# Patient Record
Sex: Female | Born: 1970
Health system: Southern US, Community
[De-identification: ages and names within clinical notes are randomized; demographics above are authoritative.]

## PROBLEM LIST (undated history)

## (undated) DIAGNOSIS — E119 Type 2 diabetes mellitus without complications: Secondary | ICD-10-CM

## (undated) DIAGNOSIS — I509 Heart failure, unspecified: Secondary | ICD-10-CM

## (undated) DIAGNOSIS — C3410 Malignant neoplasm of upper lobe, unspecified bronchus or lung: Secondary | ICD-10-CM

## (undated) DIAGNOSIS — I10 Essential (primary) hypertension: Secondary | ICD-10-CM

## (undated) DIAGNOSIS — F419 Anxiety disorder, unspecified: Secondary | ICD-10-CM

## (undated) HISTORY — PX: BREAST SURGERY: SHX581

## (undated) HISTORY — DX: Anxiety disorder, unspecified: F41.9

## (undated) HISTORY — DX: Essential (primary) hypertension: I10

## (undated) HISTORY — PX: ABDOMINAL HYSTERECTOMY: SHX81

## (undated) HISTORY — DX: Heart failure, unspecified: I50.9

## (undated) HISTORY — DX: Malignant neoplasm of upper lobe, unspecified bronchus or lung: C34.10

## (undated) HISTORY — DX: Type 2 diabetes mellitus without complications: E11.9

## (undated) HISTORY — PX: HYSTERECTOMY: SHX81

---

## 1999-01-03 ENCOUNTER — Emergency Department (HOSPITAL_COMMUNITY): Admission: EM | Admit: 1999-01-03 | Discharge: 1999-01-03 | Payer: Self-pay

## 2009-06-03 ENCOUNTER — Ambulatory Visit: Payer: Self-pay

## 2009-06-03 ENCOUNTER — Ambulatory Visit: Admission: RE | Admit: 2009-06-03 | Payer: Self-pay | Source: Ambulatory Visit | Admitting: Plastic Surgery

## 2009-06-03 LAB — HEMOGLOBIN AND HEMATOCRIT, BLOOD
Hematocrit: 43.6 % (ref 37.0–47.0)
Hgb: 14.4 g/dL (ref 12.0–16.0)

## 2009-06-06 LAB — LAB USE ONLY - HISTORICAL SURGICAL PATHOLOGY

## 2010-11-17 NOTE — H&P (Unsigned)
Account Number: [id number]    *** PRELIMINARY DRAFT ***      Document ID: [id number]      Admit Date: 06/03/2009            Patient Location: [facility]OO      Patient Type: P            ATTENDING PHYSICIAN: Estanislado Emms, MD                  CLINICAL NOTE:      This 40 year old woman presents with large-volume, full pendulous breasts.      This gives her pain, not only in the breasts themselves, but also shoulder      strap furrows, backaches, stooped posture, and inability to participate in      normal activity.  She comes to the operating room at this time for      bilateral reduction mammoplasty to try to relieve these organic symptoms.            The patient has had a full history and physical clearance by her medical      doctor, Dr. _____.  This full report as well as laboratory studies are      included with this chart and are merged with this H and P by this      reference.            The patient has had a full discussion as to the nature of the proposed      procedure.  She understands that because of her diabetic history, she is at      increased risk of wound healing problems.  She understands about the      potential for bleeding, infection, scarring, necessity for secondary      surgery, distortion, problems with wound healing including fat necrosis and      chronic drainage, loss of flaps including skin flaps, and necessity for      secondary reconstructive procedures.  She understands about decreased      nipple sensation potential and other risks.  Having thus been duly      informed, patient comes in for indicated procedure.                              _______________________________     Date/Time Signed: _____________      Babs Bertin MD [phone])            D:  06/02/2009 11:58 AM by Dr. Babs Bertin, MD [phone])      T:  06/02/2009 12:38 PM by [id number]          Everlean Cherry: [id number]) (Doc ID: [id number])                  NG:[email or web address] Oveda Dadamo MD

## 2010-11-17 NOTE — Op Note (Signed)
Grace Solomon, Grace Solomon      MRN:          [id number]      Account:      [id number]      Document ID:  [id number]      Procedure Date: 06/03/2009            Admit Date: 06/03/2009            Patient Location: DISCHARGED 06/03/2009      Patient Type: A            SURGEON: Babs Bertin MD      ASSISTANT:                  CLINICAL NOTE:      This pleasant 40 year old woman has large low hanging pendulous breasts.      This gives her pain not only in the breasts themselves, but also shoulder      strap furrows, backaches, stooped posture, and inability to participate in      normal activity.  She comes to the operating room at this time for      bilateral reduction mammoplasty to try to relieve these organic symptoms.            The patient has had a full discussion as to the nature of the proposed      procedure.  She understands the possibility of bleeding, infection,      scarring, necessity for secondary surgery, distortion, problems with wound      healing.  She is at increased risk because of her diabetes of a wound      healing problem and accepts this.  She understands about potential      decreased sensation, decreased ability to breast feed, wound breakdown and      separation with loss of skin and delayed healing and secondary      reconstructive procedures.  Having thus been duly informed, the patient      comes in for indicated procedure.            PREOPERATIVE DIAGNOSIS:      Macromastia.            TITLE OF PROCEDURE:      Bilateral reduction mammoplasty.            POSTOPERATIVE DIAGNOSIS:      Macromastia.            ANESTHESIA:      General.            DESCRIPTION OF PROCEDURE:      This patient's chest was appropriately prepped and draped.  Preoperative      markings had already been made while in the sitting position in the holding      area after the procedure of WISE.            The nipple-areolar complex to be saved was circumscribed and peripherally      de-epithelialized down to the  inframammary fold.  Then, beginning above the                                   Page 1 of 2      Grace Solomon, Grace Solomon      MRN:          [id number]      Account:      [id number]  Document ID:  [id number]      Procedure Date: 06/03/2009            nipple-areolar complex, an incision was made through breast tissue      laterally and medially so that an inferiorly based glandular flap      nipple-bearing was created and carefully preserved.            According to preoperative markings, excess skin and breast tissue was then      removed first medially and then centrally and then laterally.  This large      mass of tissue was removed from the wound and submitted for pathological      study.  Further tailoring was done.  Irrigation was done and hemostasis      made to be perfect.            The nipple-bearing glandular flap was then hinged back upward and fixed to      its new position on the chest wall and the nipple-areolar complex sutured      to the new higher position saved for it.  The lateral and medial skin flaps      were brought around the nipple-areolar complex to each other and the lower      portion to the inframammary fold.  After placing these cardinal sutures, a      row of deep heavy Vicryl sutures were placed to fix the new breast      position.  Another row of lighter Vicryl sutures were placed in the      subcutaneous layer.  Finally, a plastic intracuticular closure was done      throughout to complete the reconstruction.            After completing the right side, an essentially identical procedure was      done to the opposite side with adjustment for preoperative asymmetry of      size.            Approximately 1500 grams of tissue was removed in this way.            Appropriate dressings were placed on all wounds.  The patient was returned      to the recovery room in good condition.            This procedure was done under general anesthesia.  It did go quite smoothly      and a good  result is certainly hoped for.  The patient is instructed on the      local care of the wound and will be followed in my office as an outpatient.                                    D:  06/03/2009 14:19 PM by Dr. Babs Bertin, MD [phone])      T:  06/04/2009 10:56 AM by [id number]                  JJ:[email or web address] Elsie Ra MD                                   Page 2 of 2      Authenticated by Babs Bertin, MD On 06/10/2009 09:01:43 AM

## 2014-02-27 ENCOUNTER — Observation Stay: Payer: No Typology Code available for payment source | Admitting: Internal Medicine

## 2014-02-27 ENCOUNTER — Observation Stay
Admission: EM | Admit: 2014-02-27 | Discharge: 2014-03-01 | Disposition: A | Discharge status: Home / Self Care | Payer: No Typology Code available for payment source | Attending: Internal Medicine | Admitting: Internal Medicine

## 2014-02-27 ENCOUNTER — Emergency Department: Payer: No Typology Code available for payment source

## 2014-02-27 DIAGNOSIS — Z823 Family history of stroke: Secondary | ICD-10-CM | POA: Insufficient documentation

## 2014-02-27 DIAGNOSIS — R739 Hyperglycemia, unspecified: Secondary | ICD-10-CM

## 2014-02-27 DIAGNOSIS — I10 Essential (primary) hypertension: Secondary | ICD-10-CM | POA: Insufficient documentation

## 2014-02-27 DIAGNOSIS — R9431 Abnormal electrocardiogram [ECG] [EKG]: Secondary | ICD-10-CM | POA: Insufficient documentation

## 2014-02-27 DIAGNOSIS — R079 Chest pain, unspecified: Secondary | ICD-10-CM | POA: Diagnosis present

## 2014-02-27 DIAGNOSIS — R0789 Other chest pain: Principal | ICD-10-CM | POA: Insufficient documentation

## 2014-02-27 DIAGNOSIS — E119 Type 2 diabetes mellitus without complications: Secondary | ICD-10-CM | POA: Insufficient documentation

## 2014-02-27 DIAGNOSIS — Z8249 Family history of ischemic heart disease and other diseases of the circulatory system: Secondary | ICD-10-CM | POA: Insufficient documentation

## 2014-02-27 DIAGNOSIS — Z833 Family history of diabetes mellitus: Secondary | ICD-10-CM | POA: Insufficient documentation

## 2014-02-27 LAB — BASIC METABOLIC PANEL
Anion Gap: 8 (ref 5.0–15.0)
BUN: 12 mg/dL (ref 7.0–19.0)
CO2: 24 mEq/L (ref 22–29)
Calcium: 9 mg/dL (ref 8.5–10.5)
Chloride: 104 mEq/L (ref 100–111)
Creatinine: 0.9 mg/dL (ref 0.6–1.0)
Glucose: 304 mg/dL — ABNORMAL HIGH (ref 70–100)
Potassium: 3.6 mEq/L (ref 3.5–5.1)
Sodium: 136 mEq/L (ref 136–145)

## 2014-02-27 LAB — COMPREHENSIVE METABOLIC PANEL
ALT: 19 U/L (ref 0–55)
AST (SGOT): 13 U/L (ref 5–34)
Albumin/Globulin Ratio: 1.2 (ref 0.9–2.2)
Albumin: 4.4 g/dL (ref 3.5–5.0)
Alkaline Phosphatase: 135 U/L — ABNORMAL HIGH (ref 37–106)
Anion Gap: 14 (ref 5.0–15.0)
BUN: 14 mg/dL (ref 7.0–19.0)
Bilirubin, Total: 0.7 mg/dL (ref 0.2–1.2)
CO2: 21 mEq/L — ABNORMAL LOW (ref 22–29)
Calcium: 10.3 mg/dL (ref 8.5–10.5)
Chloride: 99 mEq/L — ABNORMAL LOW (ref 100–111)
Creatinine: 1 mg/dL (ref 0.6–1.0)
Globulin: 3.7 g/dL — ABNORMAL HIGH (ref 2.0–3.6)
Glucose: 310 mg/dL — ABNORMAL HIGH (ref 70–100)
Potassium: 4.3 mEq/L (ref 3.5–5.1)
Protein, Total: 8.1 g/dL (ref 6.0–8.3)
Sodium: 134 mEq/L — ABNORMAL LOW (ref 136–145)

## 2014-02-27 LAB — POCT PREGNANCY TEST, URINE HCG: POCT Pregnancy HCG Test, UR: NEGATIVE

## 2014-02-27 LAB — CBC AND DIFFERENTIAL
Basophils Absolute Automated: 0.02 10*3/uL (ref 0.00–0.20)
Basophils Automated: 0 %
Eosinophils Absolute Automated: 0.04 10*3/uL (ref 0.00–0.70)
Eosinophils Automated: 1 %
Hematocrit: 43.2 % (ref 37.0–47.0)
Hgb: 14.9 g/dL (ref 12.0–16.0)
Immature Granulocytes Absolute: 0 10*3/uL
Immature Granulocytes: 0 %
Lymphocytes Absolute Automated: 3.44 10*3/uL (ref 0.50–4.40)
Lymphocytes Automated: 59 %
MCH: 29 pg (ref 28.0–32.0)
MCHC: 34.5 g/dL (ref 32.0–36.0)
MCV: 84 fL (ref 80.0–100.0)
MPV: 10.3 fL (ref 9.4–12.3)
Monocytes Absolute Automated: 0.35 10*3/uL (ref 0.00–1.20)
Monocytes: 6 %
Neutrophils Absolute: 2.01 10*3/uL (ref 1.80–8.10)
Neutrophils: 34 %
Nucleated RBC: 0 /100 WBC (ref 0–1)
Platelets: 301 10*3/uL (ref 140–400)
RBC: 5.14 10*6/uL (ref 4.20–5.40)
RDW: 12 % (ref 12–15)
WBC: 5.86 10*3/uL (ref 3.50–10.80)

## 2014-02-27 LAB — CBC
Hematocrit: 37.6 % (ref 37.0–47.0)
Hgb: 12.6 g/dL (ref 12.0–16.0)
MCH: 28.3 pg (ref 28.0–32.0)
MCHC: 33.5 g/dL (ref 32.0–36.0)
MCV: 84.5 fL (ref 80.0–100.0)
MPV: 10.2 fL (ref 9.4–12.3)
Nucleated RBC: 0 /100 WBC (ref 0–1)
Platelets: 257 10*3/uL (ref 140–400)
RBC: 4.45 10*6/uL (ref 4.20–5.40)
RDW: 12 % (ref 12–15)
WBC: 6.82 10*3/uL (ref 3.50–10.80)

## 2014-02-27 LAB — PT AND APTT
PT INR: 1.1 (ref 0.9–1.1)
PT: 13.8 s (ref 12.6–15.0)
PTT: 33 s (ref 23–37)

## 2014-02-27 LAB — GLUCOSE WHOLE BLOOD - POCT
Whole Blood Glucose POCT: 168 mg/dL — ABNORMAL HIGH (ref 70–100)
Whole Blood Glucose POCT: 167 mg/dL — ABNORMAL HIGH (ref 70–100)
Whole Blood Glucose POCT: 283 mg/dL — ABNORMAL HIGH (ref 70–100)

## 2014-02-27 LAB — GFR
EGFR: >60
EGFR: >60

## 2014-02-27 LAB — TROPONIN I
Troponin I: <0.01 ng/mL (ref 0.00–0.09)
Troponin I: <0.01 ng/mL (ref 0.00–0.09)

## 2014-02-27 LAB — TSH: TSH: 0.38 u[IU]/mL (ref 0.35–4.94)

## 2014-02-27 LAB — HEMOLYSIS INDEX
Hemolysis Index: 20 — ABNORMAL HIGH (ref 0–18)
Hemolysis Index: 6 (ref 0–18)

## 2014-02-27 MED ORDER — NITROGLYCERIN 0.4 MG SL SUBL
0.4000 mg | SUBLINGUAL_TABLET | SUBLINGUAL | Status: AC
Start: 2014-02-27 — End: 2014-02-27
  Administered 2014-02-27 (×2): 0.4 mg via SUBLINGUAL
  Filled 2014-02-27: qty 1

## 2014-02-27 MED ORDER — METFORMIN HCL 500 MG PO TABS
500.0000 mg | ORAL_TABLET | Freq: Two times a day (BID) | ORAL | Status: DC
Start: 2014-02-27 — End: 2014-03-01
  Administered 2014-02-28 – 2014-03-01 (×3): 500 mg via ORAL
  Filled 2014-02-27 (×3): qty 1

## 2014-02-27 MED ORDER — ACETAMINOPHEN 325 MG PO TABS
650.0000 mg | ORAL_TABLET | Freq: Four times a day (QID) | ORAL | Status: DC | PRN
Start: 2014-02-27 — End: 2014-02-27

## 2014-02-27 MED ORDER — HYDROCHLOROTHIAZIDE 12.5 MG PO TABS
12.5000 mg | ORAL_TABLET | Freq: Every day | ORAL | Status: DC
Start: 2014-02-27 — End: 2014-02-28
  Administered 2014-02-27 – 2014-02-28 (×2): 12.5 mg via ORAL
  Filled 2014-02-27 (×2): qty 1

## 2014-02-27 MED ORDER — ONDANSETRON HCL 4 MG/2ML IJ SOLN
4.0000 mg | Freq: Three times a day (TID) | INTRAMUSCULAR | Status: AC | PRN
Start: 2014-02-27 — End: 2014-02-28

## 2014-02-27 MED ORDER — INSULIN REGULAR HUMAN 100 UNIT/ML IJ SOLN
5.0000 [IU] | Freq: Once | INTRAMUSCULAR | Status: AC
Start: 2014-02-27 — End: 2014-02-27
  Administered 2014-02-27: 5 [IU] via INTRAVENOUS
  Filled 2014-02-27: qty 15

## 2014-02-27 MED ORDER — SODIUM CHLORIDE 0.9 % IV BOLUS
1000.0000 mL | Freq: Once | INTRAVENOUS | Status: AC
Start: 2014-02-27 — End: 2014-02-27
  Administered 2014-02-27: 1000 mL via INTRAVENOUS

## 2014-02-27 MED ORDER — DEXTROSE 50 % IV SOLN
25.0000 mL | INTRAVENOUS | Status: DC | PRN
Start: 2014-02-27 — End: 2014-02-28

## 2014-02-27 MED ORDER — GLUCAGON 1 MG IJ SOLR (WRAP)
1.0000 mg | INTRAMUSCULAR | Status: DC | PRN
Start: 2014-02-27 — End: 2014-03-01

## 2014-02-27 MED ORDER — NAPROXEN 250 MG PO TABS
500.0000 mg | ORAL_TABLET | Freq: Once | ORAL | Status: AC
Start: 2014-02-27 — End: 2014-02-27
  Administered 2014-02-27: 500 mg via ORAL
  Filled 2014-02-27: qty 2

## 2014-02-27 MED ORDER — ACETAMINOPHEN 325 MG PO TABS
650.0000 mg | ORAL_TABLET | ORAL | Status: DC | PRN
Start: 2014-02-27 — End: 2014-03-01
  Administered 2014-02-27 – 2014-02-28 (×2): 650 mg via ORAL
  Filled 2014-02-27 (×2): qty 2

## 2014-02-27 MED ORDER — ACETAMINOPHEN 650 MG RE SUPP
650.0000 mg | RECTAL | Status: DC | PRN
Start: 2014-02-27 — End: 2014-03-01

## 2014-02-27 MED ORDER — DEXTROSE 50 % IV SOLN
25.0000 mL | INTRAVENOUS | Status: DC | PRN
Start: 2014-02-27 — End: 2014-03-01

## 2014-02-27 MED ORDER — GLUCAGON 1 MG IJ SOLR (WRAP)
1.0000 mg | INTRAMUSCULAR | Status: DC | PRN
Start: 2014-02-27 — End: 2014-02-28

## 2014-02-27 MED ORDER — SODIUM CHLORIDE 0.9 % IJ SOLN
3.0000 mL | Freq: Three times a day (TID) | INTRAMUSCULAR | Status: DC
Start: 2014-02-27 — End: 2014-03-01
  Administered 2014-02-28: 3 mL via INTRAVENOUS

## 2014-02-27 MED ORDER — INSULIN ASPART 100 UNIT/ML SC SOLN
1.0000 [IU] | Freq: Three times a day (TID) | SUBCUTANEOUS | Status: DC | PRN
Start: 2014-02-27 — End: 2014-03-01

## 2014-02-27 MED ORDER — ASPIRIN 81 MG PO CHEW
324.0000 mg | CHEWABLE_TABLET | Freq: Once | ORAL | Status: AC
Start: 2014-02-27 — End: 2014-02-27
  Administered 2014-02-27: 324 mg via ORAL
  Filled 2014-02-27: qty 4

## 2014-02-27 MED ORDER — ENOXAPARIN SODIUM 40 MG/0.4ML SC SOLN
40.0000 mg | Freq: Every day | SUBCUTANEOUS | Status: DC
Start: 2014-02-27 — End: 2014-03-01
  Administered 2014-02-27 – 2014-02-28 (×2): 40 mg via SUBCUTANEOUS
  Filled 2014-02-27 (×2): qty 0.4

## 2014-02-27 MED ORDER — INSULIN ASPART 100 UNIT/ML SC SOLN
1.0000 [IU] | Freq: Every evening | SUBCUTANEOUS | Status: DC | PRN
Start: 2014-02-27 — End: 2014-03-01
  Administered 2014-02-27: 3 [IU] via SUBCUTANEOUS
  Administered 2014-02-28: 1 [IU] via SUBCUTANEOUS
  Filled 2014-02-27: qty 1
  Filled 2014-02-27: qty 3

## 2014-02-27 NOTE — Treatment Plan (Signed)
VTE/PE Risk Screening  Complete Upon Admission and Transfer to Different Level of Care  Completed by nurse: Doroteo Glassman 02/27/2014 6:33 PM   -----------------------------------------------------------------------------------------------------------  SECTION 1 - Risk Screening     []   Patient currently receiving anticoagulation therapy (Heparin, Lovenox, Coumadin, Pradaxa, Xarelto, Eliquis or Arixtra Only) and received 1 dose within 24 hours of admission STOP HERE   []   VTE/PE prophylaxis currently prescribed elsewhere - STOP HERE   []   Comfort Care - STOP HERE   []   Clinical Trials - STOP HERE    Contraindications: Patients with a history of the following conditions cannot haveSequential compression devices (SCD     []  Any of these conditions present , Call MD for pharmacological prophylaxis or ask MD to document reason for not having both mechanical and pharmacologic VTE prophylaxis   []  Post-op vein ligation   []  Suspected VTE   []  Cellulitis/Dermatitis of the leg   []  Severe ischemic Vascular disease   []  Edema related to Congestive Heart Faliure   []  Gangrene   []  Recent skin graft  -----------------------------------------------------------------------------------------------------------  SECTION 2 - Risk Factors (Check all that apply)    Moderate Risk Factors   []   Heart Failure (current or history of)   []   Respiratory Failure   []   Acute Myocardial Infarction (AMI)   []   Acute Infection   []   Rheumatologic Disorder   []   Elderly age (44 years old)   []   Ongoing hormonal treatment / estrogen use (including Tamoxifen, Raloxifene)   []   Obesity (BMI >/= 30kg/m2)    High Risk Factors   []   Recent (</= 1 month) trauma/surgery    Highest Risk Factors   []   Active Cancer   []   Previous VTE   []   Reduced mobility (>24 hrs; current or anticipated)   []   Known thrombophilic condition (hematological disorders that promote thrombosis)      []   No boxes checked in this section indicate patient is at low risk for VTE.  No VTE Prophylaxis indicated.       []   One or more risk factors present, enter an EPIC order for Sequential compression devices (SCD). Use per protocol, MD signature required.

## 2014-02-27 NOTE — ED Provider Notes (Signed)
EMERGENCY DEPARTMENT HISTORY AND PHYSICAL EXAM     Physician/Midlevel provider first contact with patient: 02/27/14 0954         Date: 02/27/2014  Patient Name: Grace Solomon    History of Presenting Illness     Chief Complaint   Patient presents with   . Chest Pain       History Provided By: Patient     Chief Complaint: Chest pain   Onset: 3 days ago   Timing: Constant, waxing and waning   Location: Central and right-sided   Quality: Pressure   Severity: 7/10  Modifying Factors: Not improved with ibuprofen   Associated Symptoms: HA, light-headedness, nausea, intermittent blurred vision, intermittent back pain, feels anxious      Additional History: Grace Solomon is a 44 y.o. female with h/o HTN and DMII who presents to ED with CC of chest pain. Pt says she started having central and right sided chest pressure, HA, nausea, and light-headedness 3 days ago, and these symptoms have been constant and gradually worsening since onset. Pt says CP is currently rated as 7/10. Pt says her HA is posterior and frontal. She says she has intermittent blurred vision but does not have blurred vision currently. She also c/o intermittent back pain. Pt says she took ibuprofen with no relief of pain. Pt says her symptoms are making her feel anxious and she notes she is under stress. She denies abd pain, fever, cough, congestion, leg pain or swelling, or any other symptoms.     PCP: Jeral Fruit, MD      No current facility-administered medications for this encounter.     Current Outpatient Prescriptions   Medication Sig Dispense Refill   . hydrochlorothiazide (HYDRODIURIL) 12.5 MG tablet Take 12.5 mg by mouth daily.     . metFORMIN (GLUCOPHAGE) 500 MG tablet Take 500 mg by mouth 2 (two) times daily with meals.         Past History     Past Medical History:  Past Medical History   Diagnosis Date   . Diabetes mellitus    . Hypertension        Past Surgical History:  Past Surgical History   Procedure Laterality Date    . Hysterectomy         Family History:  History reviewed. No pertinent family history.    Social History:  History   Substance Use Topics   . Smoking status: Never Smoker    . Smokeless tobacco: Not on file   . Alcohol Use: No       Allergies:  No Known Allergies    Review of Systems     Review of Systems   Constitutional: Negative for fever.   HENT: Negative for congestion.    Eyes: Positive for visual disturbance (Intermittent blurred vision).   Respiratory: Negative for cough.    Cardiovascular: Positive for chest pain (Pressure). Negative for leg swelling.   Gastrointestinal: Positive for nausea. Negative for abdominal pain.   Genitourinary: Negative for difficulty urinating.   Musculoskeletal: Positive for back pain.   Skin: Negative for wound.   Neurological: Positive for light-headedness and headaches.   Psychiatric/Behavioral: The patient is nervous/anxious.         Physical Exam   BP 135/75 mmHg  Pulse 91  Temp(Src) 98.2 F (36.8 C)  Resp 16  SpO2 98%    Physical Exam   Constitutional: She is oriented to person, place, and time. She appears well-developed and well-nourished.  No distress.   HENT:   Head: Normocephalic and atraumatic.   Right Ear: External ear normal.   Left Ear: External ear normal.   Mouth/Throat: Oropharynx is clear and moist. No oropharyngeal exudate.   Eyes: Conjunctivae and EOM are normal. Pupils are equal, round, and reactive to light.   Neck: Normal range of motion. Neck supple. No JVD present.   Cardiovascular: Normal rate, regular rhythm and normal heart sounds.    Pulmonary/Chest: Effort normal and breath sounds normal. No stridor. No respiratory distress. She has no wheezes. She has no rales. She exhibits no tenderness.   Abdominal: Soft. She exhibits no distension. There is no tenderness. There is no rebound and no guarding.   Musculoskeletal: Normal range of motion.   Neurological: She is alert and oriented to person, place, and time. No cranial nerve deficit.   Skin:  Skin is warm and dry. No rash noted. She is not diaphoretic. No erythema. No pallor.   Psychiatric: Her behavior is normal. Judgment and thought content normal.   Tearful, anxious.   Nursing note and vitals reviewed.      Diagnostic Study Results     Labs -     Results     Procedure Component Value Units Date/Time    Troponin I [[id number]] Collected:  02/27/14 1005    Specimen Information:  Blood Updated:  02/27/14 1043     Troponin I <0.01 ng/mL     Comprehensive metabolic panel [[id number]]  (Abnormal) Collected:  02/27/14 1005    Specimen Information:  Blood Updated:  02/27/14 1038     Glucose 310 (H) mg/dL      BUN 66.4 mg/dL      Creatinine 1.0 mg/dL      Sodium 403 (L) mEq/L      Potassium 4.3 mEq/L      Chloride 99 (L) mEq/L      CO2 21 (L) mEq/L      CALCIUM 10.3 mg/dL      Protein, Total 8.1 g/dL      Albumin 4.4 g/dL      AST (SGOT) 13 U/L      ALT 19 U/L      Alkaline Phosphatase 135 (H) U/L      Bilirubin, Total 0.7 mg/dL      Globulin 3.7 (H) g/dL      Albumin/Globulin Ratio 1.2      Anion Gap 14.0     Hemolysis index [[id number]]  (Abnormal) Collected:  02/27/14 1005     Hemolysis Index 20 (H) Updated:  02/27/14 1038    GFR [[id number]] Collected:  02/27/14 1005     EGFR >60.0 Updated:  02/27/14 1038    CBC with differential [[id number]] Collected:  02/27/14 1005    Specimen Information:  Blood / Blood Updated:  02/27/14 1014     WBC 5.86 x10 3/uL      Hgb 14.9 g/dL      Hematocrit 66.0 %      Platelets 301 x10 3/uL      RBC 5.14 x10 6/uL      MCV 84.0 fL      MCH 29.0 pg      MCHC 34.5 g/dL      RDW 12 %      MPV 10.3 fL      Neutrophils 34 %      Lymphocytes Automated 59 %      Monocytes 6 %      Eosinophils Automated 1 %  Basophils Automated 0 %      Immature Granulocyte 0 %      Nucleated RBC 0 /100 WBC      Neutrophils Absolute 2.01 x10 3/uL      Abs Lymph Automated 3.44 x10 3/uL      Abs Mono Automated 0.35 x10 3/uL      Abs Eos Automated 0.04 x10 3/uL      Absolute Baso Automated 0.02 x10 3/uL       Absolute Immature Granulocyte 0.00 x10 3/uL           Radiologic Studies -   Radiology Results (24 Hour)     Procedure Component Value Units Date/Time    Chest 2 Views [[id number]] Collected:  02/27/14 1038    Order Status:  Completed Updated:  02/27/14 1042    Narrative:      History: chest pain    Technique: PA and Lateral    Comparison: None.    Findings:  The lungs appear clear.  There is no pneumothorax.  The heart is normal in size.    The mediastinum is within normal limits.             Impression:       No active disease is seen in the chest.    Laurena Slimmer, MD   02/27/2014 10:38 AM        .      Medical Decision Making   I am the first provider for this patient.    I reviewed the vital signs, available nursing notes, past medical history, past surgical history, family history and social history.    Vital Signs-Reviewed the patient's vital signs.     Patient Vitals for the past 12 hrs:   BP Temp Pulse Resp   02/27/14 1117 135/75 mmHg - 91 16   02/27/14 1004 (!) 174/93 mmHg 98.2 F (36.8 C) 99 18       Pulse Oximetry Analysis - Normal 100% on RA    Cardiac Monitor:  Rate: 99  Rhythm:  Normal Sinus Rhythm     EKG:  Interpreted by the EP.   Time Interpreted: 9:59 AM   Rate: 82   Rhythm: Normal Sinus Rhythm    Interpretation: Normal intervals. No ST abnormalities. Significant artifact in V5.    Comparison: No prior study is available for comparison.     Old Medical Records: Old medical records.  Nursing notes.     ED Course:     10:19 AM - Pt says she feels like her chest pressure has improved after first nitro.     11:08 AM - Pt is feeling better after second nitro. Discussed plan for hospitalization with pt, she agrees to plan.     11:10 AM - Discussed pt case with Dr. Letitia Neri, cardiology on call, agrees to consult.     11:43 AM - Have paged [facility] twice with no response. Tried to call direct line and line is busy.     11:45 AM - Discussed pt case with Dr. Lezlie Octave, [facility], who accepts pt for admission.      Provider Notes:   Pt with three days of waxing and waning right sided chest pressure. She does have cardiac risk factors (poorly controlled DM, poorly controlled HTN, dad with stroke at young age) and no prior cardiac workup. Will need admit for serial enzymes, cardiology consult, possible stress test and adjustment of diabetic and HTN meds. Pt agreeable with the plan.  Diagnosis     Clinical Impression:   1. Chest pain, unspecified chest pain type    2. Hyperglycemia    3. Essential hypertension        _______________________________    Attestations:  This note is prepared by Doristine Counter acting as Scribe for Debbora Lacrosse, MD.    Debbora Lacrosse, MD. The scribe's documentation has been prepared under my direction and personally reviewed by me in its entirety.  I confirm that the note above accurately reflects all work, treatment, procedures, and medical decision making performed by me.    _______________________________        Gurney Maxin, MD  02/27/14 [phone]

## 2014-02-27 NOTE — Plan of Care (Signed)
Problem: Chest Pain  Goal: Vital signs and cardiac rhythm stable  Outcome: Progressing  Arrived from ED at about 1600; A&Ox3; SBP 150s; telemetry: SR; c/o chest pressure 4/10 right chest; patient tearful and seemed upset, pt refused to tell what is bothering her; pain relieved after rest and pain medicine; CE negative x 1; Plan: cardiac enzymes; monitor vitals signes, pain management;

## 2014-02-27 NOTE — ED Notes (Signed)
Patient c/o chest pain for the last 3 days. Patient is aox 4.

## 2014-02-27 NOTE — Consults (Addendum)
[facility] CONSULTATION REPORT  [facility]    Date Time: 02/27/2014 8:45 PMPatient Name: Grace Solomon  Requesting Physician: Theresa Duty, MD       Reason for Consultation:   Chest Pain      History:   Grace Solomon is a 44 y.o. female admitted from ER where she was taken by [facility] from [facility] where she presented complaining of 3 day history of intermittent SOB, palpitations and chest pressure occuring with and without exertion.   BP elevated to 170/120 at [facility].  We have been asked by Dr. Catalina Antigua  to provide cardiac consultation, regarding further  evaluation and management.     No known history of cardiac disease.   Has been on HCTZ for HBP and has been treated with metformin for DM for the past two years.  No history of hypercholesterolemia or tobacco use.  Family history positive for hypertension, diabetes and CVA (father).       Past Medical History:     Past Medical History   Diagnosis Date   . Diabetes mellitus    . Hypertension        Past Surgical History:     Past Surgical History   Procedure Laterality Date   . Hysterectomy         Family History:   History reviewed. No pertinent family history.    Social History:     History     Social History   . Marital Status: Divorced     Spouse Name: N/A     Number of Children: N/A   . Years of Education: N/A     Social History Main Topics   . Smoking status: Never Smoker    . Smokeless tobacco: Not on file   . Alcohol Use: No   . Drug Use: No   . Sexual Activity: Not on file     Other Topics Concern   . Not on file     Social History Narrative   . No narrative on file       Allergies:   No Known Allergies    Medications:     Prescriptions prior to admission   Medication Sig   . hydrochlorothiazide (HYDRODIURIL) 12.5 MG tablet Take 25 mg by mouth daily.      . metFORMIN (GLUCOPHAGE) 500 MG tablet Take 1,000 mg by mouth once.          Current Facility-Administered Medications   Medication Dose Route  Frequency Provider Last Rate Last Dose   . acetaminophen (TYLENOL) tablet 650 mg  650 mg Oral Q4H PRN Solon Palm, MD   650 mg at 02/27/14 2037    Or   . acetaminophen (TYLENOL) suppository 650 mg  650 mg Rectal Q4H PRN Solon Palm, MD       . dextrose 50 % bolus 25 mL  25 mL Intravenous PRN Solon Palm, MD       . enoxaparin (LOVENOX) syringe 40 mg  40 mg Subcutaneous Daily Solon Palm, MD   40 mg at 02/27/14 1629   . glucagon (rDNA) (GLUCAGEN) injection 1 mg  1 mg Intramuscular PRN Solon Palm, MD       . hydrochlorothiazide (HYDRODIURIL) tablet 12.5 mg  12.5 mg Oral Daily Solon Palm, MD   12.5 mg at 02/27/14 1629   . insulin aspart (NovoLOG) injection 1-8 Units  1-8 Units Subcutaneous TID AC PRN Solon Palm, MD       .  metFORMIN (GLUCOPHAGE) tablet 500 mg  500 mg Oral BID Meals Solon Palm, MD   500 mg at 02/27/14 1629   . ondansetron (ZOFRAN) injection 4 mg  4 mg Intravenous Q8H PRN Gurney Maxin, MD       . sodium chloride (PF) 0.9 % flush 3 mL  3 mL Intravenous Q8H Solon Palm, MD             Review of Systems:    Comprehensive review of systems including constitutional, eyes, ears, nose, mouth, throat, cardiovascular, GI, GU, musculoskeletal, integumentary, respiratory, neurologic, psychiatric, and endocrine is negative other than what is mentioned already in the history of present illness    Physical Exam:     Filed Vitals:    02/27/14 1910   BP: 142/86   Pulse: 83   Temp: 97.2 F (36.2 C)   Resp: 16   SpO2: 99%     Temp (24hrs), Avg:98 F (36.7 C), Min:97.2 F (36.2 C), Max:98.9 F (37.2 C)      Intake and Output Summary (Last 24 hours) at Date Time  No intake or output data in the 24 hours ending 02/27/14 2045    GENERAL: Patient is in no acute distress   HEENT: No scleral icterus or conjunctival pallor, moist mucous membranes   NECK: No jugular venous distention or thyromegaly, normal carotid upstrokes without bruits   CARDIAC: Normal apical  impulse, regular rate and rhythm, with normal S1 and S2.  Apical S4.  No S3. No murmurs or rubs.   CHEST: Clear to auscultation bilaterally, normal respiratory effort  ABDOMEN: No abdominal bruits, masses, or hepatosplenomegaly, non tender, non-distended, good bowel sounds   EXTREMITIES: No clubbing, cyanosis, or edema, 2+ DP, PT, and femoral pulses bilaterally without bruits  SKIN: No rash or jaundice   NEUROLOGIC: Alert and oriented to time, place and person, normal mood and affect  MUSCULOSKELETAL: Normal muscle strength and tone.      Labs Reviewed:       Recent Labs  Lab 02/27/14  1930 02/27/14  1005   TROPONIN I <0.01 <0.01               Recent Labs  Lab 02/27/14  1005   BILIRUBIN, TOTAL 0.7   PROTEIN, TOTAL 8.1   ALBUMIN 4.4   ALT 19   AST (SGOT) 13           Recent Labs  Lab 02/27/14  1930   PT 13.8   PT INR 1.1   PTT 33       Recent Labs  Lab 02/27/14  1930 02/27/14  1005   WBC 6.82 5.86   HEMOGLOBIN 12.6 14.9   HEMATOCRIT 37.6 43.2   PLATELETS 257 301       Recent Labs  Lab 02/27/14  1930 02/27/14  1005   SODIUM 136 134*   POTASSIUM 3.6 4.3   CHLORIDE 104 99*   CO2 24 21*   BUN 12.0 14.0   CREATININE 0.9 1.0   EGFR >60.0 >60.0   GLUCOSE 304* 310*   CALCIUM 9.0 10.3     EKG - sinus rhythm, rate 92 BPM, cannot exclude old ASMI.     Radiology   Radiological Procedure reviewed.      chest X-ray 02/27/2014:  History: chest pain  Technique: PA and Lateral  Comparison: None.  Findings:  The lungs appear clear.  There is no pneumothorax.  The heart is normal in size. The mediastinum is within normal  limits.  IMPRESSION:   No active disease is seen in the chest.  Laurena Slimmer, MD  Assessment:    Atypical Chest Pain.  No evidence of acute MI   Abnormal ECG   NIDDM   Hypertension    Recommendations:    1.          Toprol XL 50 mg daily   2.          ASA 81 mg daily   3.          Echocardiogram   4.          Fasting lipid profile   5.          Lexiscan nuclear study in AM.        Thanks much.  Will follow.    Signed  by: Montey Hora, MD    [facility]  NP Spectralink [phone] (8am-5pm)  MD Philis Kendall  [phone])  After hours, non urgent consult line [phone]  After Hours, urgent consults [phone]

## 2014-02-27 NOTE — ED Notes (Signed)
Bed: GR7  Expected date: 02/27/14  Expected time: 9:49 AM  Means of arrival: [facility]- Generic/No Candise [facility] Mobile#  Comments:  Medic [phone]

## 2014-02-27 NOTE — H&P (Signed)
Patient Type: V     ATTENDING PHYSICIAN: Theresa Duty, MD     CHIEF COMPLAINT:  Intermittent chest pain since Wednesday.     HISTORY OF PRESENT ILLNESS:  Miss Grace Solomon is a 44 year old female with history of type 2  diabetes and hypertension, presented to the emergency room having  intermittent chest pain that has been going on since Wednesday that got  worse today in the morning.  The patient stated the pain is like pressure  like, associated with headache and feeling nauseated and lightheadedness.   The pain is mainly indicated to be on the right midsternal border and it  does not radiate and not associated with shortness of breath, cough, fever,  or chills.  She did not vomit, but she states that she felt nauseated.  She  took some Tylenol and it did not help.  The chest pain was relieved after  two doses of nitroglycerin in the emergency room, although now she gets a  headache most likely because of some nitroglycerin.  At present the patient  is asymptomatic.  The patient states she takes hydrochlorothiazide and  metformin for diabetes and follows up with her primary care doctor, .   The patient does not smoke, does not drink, no family history of coronary  artery disease; however, father died because of a stroke due to  uncontrolled hypertension.  The patient states she also took ibuprofen to  relieve the pain, but the pain kept on coming.  The patient stated that she  is anxious and stressed at work.  She denies any abdominal pain, denies any  flu like symptoms.  Her doctor, named Dr. Remus Loffler.       MEDICATIONS:  Hydrochlorothiazide 12.5, metformin 500 mg.     ALLERGIES:  No known allergies.     FAMILY HISTORY:  Father died of stroke because of uncontrolled hypertension.  Mother is  alive and healthy.  She has two sisters, one older and one younger.  The  younger sister has diabetes.     SOCIAL HISTORY:  She is the mother of two children.  She does not smoke.  She does not  drink.  She  does not use drugs.  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     PAST SURGICAL HISTORY:  Hysterectomy due to excessive vaginal bleeding and dysmenorrhea, she does  not take any hormonal steroids.     REVIEW OF SYSTEMS:  GENERAL:  Now having a little headache, denied any fever or chills.  HEENT:  Denied any blurred vision, denied any neck pain, and denied any  sore throat.  CHEST:  See history of present illness.  CARDIOVASCULAR:  See history of  present illness.  GASTROINTESTINAL:  Felt nauseated but did not vomit, no diarrhea, no  hematemesis or melena.  GENITOURINARY:  Denies any dysuria, frequency, urgency, or change in urine  color.  NEUROLOGIC:  Denied any syncopal episodes, seizures, denied any weakness or  numbness.  PSYCHIATRIC:  She says she feels a little bit stressed and nervous and  anxious, but denied any suicidal or homicidal intentions.  DERMATOLOGY:  Denied any rash or itching.  EXTREMITIES:  Denied any joint pain or swelling.     PHYSICAL EXAMINATION:  GENERAL:  Alert, oriented x3, in no acute cardiopulmonary distress,  pleasant, cooperative with daughter sitting on bedside during this  examination.    VITAL SIGNS:  Blood pressure 175/75, heart rate 90 per minute, temperature  98.22, respiratory rate 16, pulse oximetry 98% on room air.  HEENT:  Not pale, not icteric, moist tongue but no oral lesion.  NECK:  Supple.  Thyroid gland not enlarged.  JVD not elevated, no carotid  bruits.  CHEST:  Normal breath sounds, no wheezes, no crackles.  CARDIOVASCULAR: Reproducible tenderness over the right lateral to the  xiphoid and costochondral angle and to the sternal border.  Otherwise,  normal first and second heart sounds, no murmurs, no friction rub, no  gallop.  ABDOMEN:  Full, normal bowel sounds, nontender.  Liver and spleen were not  palpable.  No guarding, no rebound.  EXTREMITIES:  No ankle edema, no swelling.  NEUROLOGICAL:  Cranial nerves II through XII are grossly intact, no  sensory  or motor deficit.  DERMATOLOGIC:  No rash, no petechiae.     LABORATORY DATA:  White cell count 5.86, hemoglobin 14.9, hematocrit 43.2, platelet count  301, blood glucose was 310, BUN 14, creatinine 1.  Sodium 134, potassium  4.3, chloride 99 and the first set of troponin was less than 0.01, normal  liver function tests.       Chest x-ray:  No acute process.  EKG:  Normal sinus rhythm, 90 per minute,  no acute ST-T changes.     IMPRESSION:  1.  Atypical reproducible chest pain with negative primary cardiac markers  and no EKG change.  However, the patient's symptoms are relieved by  nitroglycerin and will be admitted to rule out acute coronary syndrome with  protocol.  Dr. Letitia Neri of cardiology was consulted by the ED attending.   The patient will be monitored.  I will hold any nitroglycerin.  I prefer to  give her naproxen because I feel more that it is reproducible  costochondral, but she will be kept on aspirin.  There is no indication for  beta blockers or ACE inhibitors at this time.    2.  Hypertension; controlled on low dose of hydrochlorothiazide and will  continue on that.  3.  Uncontrolled diabetes, probably she misses the medication or the stress  and I will start her on metformin and give her aspartate insulin with  meals.  She will be on diabetic diet and cardiac diet.  The patient will be  given Enoxaparin  for deep venous thrombosis prophylaxis and there is no  indication for any gastrointestinal.    4.  The patient will be admitted to a telemetry bed.           D:  02/27/2014 15:26 PM by Dr. Solon Palm, MD [phone])  T:  02/27/2014 16:52 PM by       Everlean Cherry: [id number]) (Doc ID: [id number])

## 2014-02-28 ENCOUNTER — Observation Stay: Payer: No Typology Code available for payment source

## 2014-02-28 ENCOUNTER — Ambulatory Visit (INDEPENDENT_AMBULATORY_CARE_PROVIDER_SITE_OTHER): Payer: Self-pay

## 2014-02-28 LAB — ECG 12-LEAD
Atrial Rate: 82 {beats}/min
Atrial Rate: 82 {beats}/min
P Axis: 62 degrees
P Axis: 72 degrees
P-R Interval: 162 ms
P-R Interval: 162 ms
Q-T Interval: 374 ms
Q-T Interval: 384 ms
QRS Duration: 80 ms
QRS Duration: 88 ms
QTC Calculation (Bezet): 436 ms
QTC Calculation (Bezet): 448 ms
R Axis: -1 degrees
R Axis: 3 degrees
T Axis: 24 degrees
T Axis: 26 degrees
Ventricular Rate: 82 {beats}/min
Ventricular Rate: 82 {beats}/min

## 2014-02-28 LAB — GLUCOSE WHOLE BLOOD - POCT
Whole Blood Glucose POCT: 261 mg/dL — ABNORMAL HIGH (ref 70–100)
Whole Blood Glucose POCT: 219 mg/dL — ABNORMAL HIGH (ref 70–100)
Whole Blood Glucose POCT: 189 mg/dL — ABNORMAL HIGH (ref 70–100)
Whole Blood Glucose POCT: 160 mg/dL — ABNORMAL HIGH (ref 70–100)

## 2014-02-28 LAB — HEMOLYSIS INDEX: Hemolysis Index: 9 (ref 0–18)

## 2014-02-28 LAB — LIPID PANEL
Cholesterol / HDL Ratio: 9.1
Cholesterol: 246 mg/dL — ABNORMAL HIGH (ref 0–199)
HDL: 27 mg/dL — ABNORMAL LOW (ref >40)
LDL Calculated: 151 mg/dL — ABNORMAL HIGH (ref 0–99)
Triglycerides: 341 mg/dL — ABNORMAL HIGH (ref 34–149)
VLDL Calculated: 68 mg/dL — ABNORMAL HIGH (ref 10–40)

## 2014-02-28 LAB — HEMOGLOBIN A1C: Hemoglobin A1C: 12.3 % — ABNORMAL HIGH (ref 0.0–6.0)

## 2014-02-28 LAB — TROPONIN I: Troponin I: <0.01 ng/mL (ref 0.00–0.09)

## 2014-02-28 MED ORDER — REGADENOSON 0.4 MG/5ML IV SOLN
INTRAVENOUS | Status: AC
Start: 2014-02-28 — End: 2014-02-28
  Filled 2014-02-28: qty 5

## 2014-02-28 MED ORDER — TECHNETIUM TC 99M TETROFOSMIN INJECTION
1.0000 | Freq: Once | Status: AC | PRN
Start: 2014-02-28 — End: 2014-02-28
  Administered 2014-02-28: 1 via INTRAVENOUS

## 2014-02-28 MED ORDER — ATORVASTATIN CALCIUM 40 MG PO TABS
40.0000 mg | ORAL_TABLET | Freq: Every evening | ORAL | Status: DC
Start: 2014-02-28 — End: 2014-03-01
  Administered 2014-02-28: 40 mg via ORAL
  Filled 2014-02-28: qty 1

## 2014-02-28 MED ORDER — HYDROCHLOROTHIAZIDE 25 MG PO TABS
25.0000 mg | ORAL_TABLET | Freq: Every day | ORAL | Status: DC
Start: 2014-03-01 — End: 2014-03-01
  Filled 2014-02-28: qty 1

## 2014-02-28 MED ORDER — DIPHENHYDRAMINE HCL 50 MG/ML IJ SOLN
25.0000 mg | Freq: Every evening | INTRAMUSCULAR | Status: DC | PRN
Start: 2014-02-28 — End: 2014-03-01
  Administered 2014-02-28: 25 mg via INTRAVENOUS
  Filled 2014-02-28: qty 1

## 2014-02-28 MED ORDER — HYDRALAZINE HCL 25 MG PO TABS
50.0000 mg | ORAL_TABLET | Freq: Three times a day (TID) | ORAL | Status: DC | PRN
Start: 2014-02-28 — End: 2014-03-01
  Administered 2014-02-28: 50 mg via ORAL
  Filled 2014-02-28: qty 2

## 2014-02-28 MED ORDER — METOPROLOL SUCCINATE ER 50 MG PO TB24
50.0000 mg | ORAL_TABLET | Freq: Every day | ORAL | Status: DC
Start: 2014-02-28 — End: 2014-03-01

## 2014-02-28 MED ORDER — LISINOPRIL 10 MG PO TABS
10.0000 mg | ORAL_TABLET | Freq: Every day | ORAL | Status: DC
Start: 2014-02-28 — End: 2014-03-01
  Administered 2014-02-28: 10 mg via ORAL
  Filled 2014-02-28 (×2): qty 1

## 2014-02-28 MED ORDER — METOPROLOL SUCCINATE ER 50 MG PO TB24
50.0000 mg | ORAL_TABLET | Freq: Every day | ORAL | Status: DC
Start: 2014-02-28 — End: 2014-03-01
  Administered 2014-02-28: 50 mg via ORAL
  Filled 2014-02-28 (×2): qty 1

## 2014-02-28 MED ORDER — HYDROCHLOROTHIAZIDE 12.5 MG PO TABS
12.5000 mg | ORAL_TABLET | Freq: Once | ORAL | Status: AC
Start: 2014-02-28 — End: 2014-02-28
  Administered 2014-02-28: 12.5 mg via ORAL
  Filled 2014-02-28: qty 1

## 2014-02-28 MED ORDER — DIPHENHYDRAMINE HCL 50 MG/ML IJ SOLN
25.0000 mg | Freq: Every evening | INTRAMUSCULAR | Status: DC
Start: 2014-02-28 — End: 2014-02-28

## 2014-02-28 MED ORDER — ATORVASTATIN CALCIUM 40 MG PO TABS
40.0000 mg | ORAL_TABLET | Freq: Every evening | ORAL | Status: AC
Start: 2014-02-28 — End: ?

## 2014-02-28 NOTE — Progress Notes (Signed)
LEXISCAN NUCLEAR STUDY    Patient name: Grace Solomon  Date of Birth: [birth year]  CSN: [id number]  MRN: [id number]  Date:02/28/2014      INDICATION: Chest Pain  After informed consent, Lexiscan nuclear study was performed per protocol.  Patient received a standard injection of 0.4 mg of Lexiscan.  Baseline EKG reveals normal sinus rhythm, poor R wave progression anteriorly.   With Lexiscan injection there were no diagnostic EKG changes or sustained arrhythmias.    IMPRESSIONS:  1. Lexiscan nuclear study completed without complication.  2. No diagnostic EKG changes with Lexiscan injection.  3. Nuclear images pending.    Montey Hora, MD

## 2014-02-28 NOTE — Plan of Care (Addendum)
Problem: Chest Pain  Goal: Vital signs and cardiac rhythm stable  Outcome: Progressing  The patient and care giver's learning abilities have been assessed. Today's individualized plan of care to monitor chest pain, BP, BS & troponin serials, continue with pain management, fall safety prevention with hourly rounding & NPO @ midnight for Echo & stress test in AM was discussed with the patient and care giver and agree to it. Patient and care giver demonstrates understanding of disease process, treatment plan, medications and consequences of noncompliance. All questions and concerns were addressed.    A&OX4, NSR on the monitor. Denied of chest pain or SOB. Pt c/o of headache, PRN tylenol given with good relief. Pt's BP stable. Chest-ray & troponin X3 negative. Pt did not have bedtime PRN Novolog on MAR, called Dr. Seward Speck, M. & received order. Pt received 3 units of bedtime novolog for BS 283 as ordered. Daughter is at bedside. Will continue to monitor pt's VS, AM labs & assess pt's pain comfort level.

## 2014-02-28 NOTE — Progress Notes (Signed)
MEDICINE PROGRESS NOTE        Date Time: 02/28/2014 4:08 PM  Patient Name: Grace Solomon  Attending Physician: Charlynn Grimes, MD  Hospital Day: 2  Assessment:     Active Hospital Problems    Diagnosis   . Chest pain, unspecified   . DM (diabetes mellitus)   . HTN (hypertension)     Plan:   Cardiac stress test is negative, enzymes are okay  Echocardiogram still pending    Case discussed with: pt, RN    Safety Checklist:     DVT prophylaxis:  CHEST guideline (See page [id number]) Chemical   Foley:  Romeo Rn Foley protocol Not present   IVs:  Peripheral IV   PT/OT: Not needed   Daily CBC & or Chem ordered:  SHM/ABIM guidelines (see #5) Yes, due to clinical and lab instability     Lines:     Patient Lines/Drains/Airways Status    Active PICC Line / CVC Line / PIV Line / Drain / Airway / Intraosseous Line / Epidural Line / ART Line / Line / Wound / Pressure Ulcer / NG/OG Tube     Name:   Placement date:   Placement time:   Site:   Days:    Peripheral IV 02/27/14 Right Antecubital  02/27/14   1006   Antecubital   1                 Disposition:     Today's date: 02/28/2014  Length of Stay: 1  Anticipated medical stability for discharge: tomorrow    Subjective     CC: <principal problem not specified>    HPI/Subjective: pain is much better now     Review of Systems:   Review of Systems - Negative except as above in HPI    Physical Exam:     Temp:  [96.8 F (36 C)-97.9 F (36.6 C)] 97.9 F (36.6 C)  Heart Rate:  [71-83] 79  Resp Rate:  [16-18] 18  BP: (119-142)/(67-95) 129/81 mmHg    Intake/Output Summary (Last 24 hours) at 02/28/14 1608  Last data filed at 02/28/14 0600   Gross per 24 hour   Intake    200 ml   Output      0 ml   Net    200 ml    General: awake, alert ,in no acute distress  Cardiovascular: regular rate and rhythm, no murmurs, rubs or gallops  Lungs: clear to auscultation bilaterally, no additional sounds  Abdomen: soft, non-tender, non-distended; normoactive bowel sounds  Extremities: no  edema  Neurological: Alert and oriented X 3, moves all extremities.        Meds:   Medications were reviewed:  Scheduled Meds:  Current Facility-Administered Medications   Medication Dose Route Frequency   . atorvastatin  40 mg Oral QHS   . enoxaparin  40 mg Subcutaneous Daily   . hydrochlorothiazide  12.5 mg Oral Daily   . metFORMIN  500 mg Oral BID Meals   . metoprolol XL  50 mg Oral Daily   . sodium chloride (PF)  3 mL Intravenous Q8H     Continuous Infusions:   PRN Meds:.acetaminophen **OR** acetaminophen, dextrose, dextrose, glucagon (rDNA), glucagon (rDNA), insulin aspart, insulin aspart  Labs:     Recent Labs      02/28/14   1157  02/28/14   0800  02/27/14   2116   POCT - GLUCOSE WHOLE BLOOD  219*  261*  283*  Recent Labs  Lab 02/27/14  1930 02/27/14  1005   WBC 6.82 5.86   HEMOGLOBIN 12.6 14.9   HEMATOCRIT 37.6 43.2   PLATELETS 257 301      Recent Labs  Lab 02/27/14  1930   PT 13.8   PT INR 1.1   PTT 33         Recent Labs  Lab 02/27/14  1930 02/27/14  1005   SODIUM 136 134*   POTASSIUM 3.6 4.3   CHLORIDE 104 99*   CO2 24 21*   BUN 12.0 14.0   CREATININE 0.9 1.0   EGFR >60.0 >60.0   GLUCOSE 304* 310*   CALCIUM 9.0 10.3      Recent Labs  Lab 02/27/14  1005   ALKALINE PHOSPHATASE 135*   BILIRUBIN, TOTAL 0.7   PROTEIN, TOTAL 8.1   ALBUMIN 4.4   ALT 19   AST (SGOT) 13          Signed by: Charlynn Grimes, MD

## 2014-02-28 NOTE — Discharge Summary (Signed)
DISCHARGE SUMMARY/PROGRESS NOTE      Date Time: 03/01/2014 5:11 PM  Patient Name: Grace Solomon  Attending Physician: No att. providers found  Primary Care Physician: Jeral Fruit, MD    Date of Admission:   02/27/2014    Date of Discharge:   03/01/2014    Discharge Dx:   Principal Diagnosis: <principal problem not specified>  Active Hospital Problems    Diagnosis   . Chest pain, unspecified   . DM (diabetes mellitus)   . HTN (hypertension)       Consultations:   Treatment Team: Consulting Physician: Montey Hora, MD; Consulting Physician: Theresa Duty, MD; Consulting Physician: Solon Palm, MD; Technician: Jacob Moores; Registered Nurse: Doroteo Glassman, RN; Registered Nurse: Minette Headland, RN; Registered Nurse: Arvilla Market, RN; Registered Nurse: Argentina Ponder, RN; Case Manager: Renda Rolls, RN     Pending Results, Recommendations & Instructions to providers after discharge:   Micro / Labs / Path pending:        Unresulted Labs     Procedure . . . Date/Time    Troponin I [[id number]] Collected:  02/28/14 0245    Specimen Information:  Blood Updated:  02/28/14 0245        Procedures performed:   Radiology: all results from this admission  Chest 2 Views    02/27/2014    No active disease is seen in the chest.  Laurena Slimmer, MD  02/27/2014 10:38 AM     Nm Myocardial Perfusion Spect (stress And Rest)    02/28/2014    No evidence of inducible ischemia  Laurena Slimmer, MD  02/28/2014 12:11 PM     Surgery: all results from this admission  * No surgery found [facility] Course:   Reason for admission / HPI: See admission H&P for full details. Miss Grace Solomon is a 44 year old female with history of type 2  diabetes and hypertension, presented to the emergency room having  intermittent chest pain that has been going on since Wednesday that got  worse today in the morning. The patient stated the pain is like pressure  like, associated with headache and feeling nauseated  and lightheadedness.   The pain is mainly indicated to be on the right midsternal border and it  does not radiate and not associated with shortness of breath, cough, fever,  or chills. She did not vomit, but she states that she felt nauseated. She  took some Tylenol and it did not help. The chest pain was relieved after  two doses of nitroglycerin in the emergency room, although now she gets a  headache most likely because of some nitroglycerin. At present the patient  is asymptomatic. The patient states she takes hydrochlorothiazide and  metformin for diabetes and follows up with her primary care doctor, .   The patient does not smoke, does not drink, no family history of coronary  artery disease; however, father died because of a stroke due to  uncontrolled hypertension. The patient states she also took ibuprofen to  relieve the pain, but the pain kept on coming. The patient stated that she  is anxious and stressed at work. She denies any abdominal pain, denies any  flu like symptoms. Her doctor, named Dr. Remus Loffler.    Hospital Course:   Patient had normal echocardiogram.  Cardiac enzymes remained negative.  Cardiac stress test was normal.  Patient was started on lisinopril for blood pressure control.  Chest pain  resolved.  Patient discharged home.    Patient condition at discharge: stable  DISCHARGE DAY EXAM:  Today:  BP 91/63 mmHg  Pulse 72  Temp(Src) 97.3 F (36.3 C) (Oral)  Resp 18  Ht 1.651 m (5\' 5" )  Wt 67.677 kg (149 lb 3.2 oz)  BMI 24.83 kg/m2  SpO2 99%  Ranges for the last 24 hours:  Temp:  [97 F (36.1 C)-98.2 F (36.8 C)] 97.3 F (36.3 C)  Heart Rate:  [72-90] 72  Resp Rate:  [16-18] 18  BP: (91-191)/(61-103) 91/63 mmHg  Body mass index is 24.83 kg/(m^2).    No intake or output data in the 24 hours ending 03/01/14 1711 General: awake, alert,in no acute distress  Cardiovascular: regular rate and rhythm, no murmurs, rubs or gallops  Lungs: clear to auscultation bilaterally, no additional  sounds  Abdomen: soft, non-tender, non-distended; normoactive bowel sounds  Extremities: no edema  Neurological: Alert and oriented X 3, moves all extremities.        Labs/Procedures/Radiology performed:       Recent Labs  Lab 02/28/14  0245 02/27/14  1930 02/27/14  1005   TROPONIN I <0.01 <0.01 <0.01         Recent Labs  Lab 02/27/14  1930 02/27/14  1005   WBC 6.82 5.86   HEMOGLOBIN 12.6 14.9   HEMATOCRIT 37.6 43.2   PLATELETS 257 301      Recent Labs  Lab 02/27/14  1930   PT 13.8   PT INR 1.1   PTT 33          Recent Labs  Lab 02/27/14  1930 02/27/14  1005   SODIUM 136 134*   POTASSIUM 3.6 4.3   CHLORIDE 104 99*   CO2 24 21*   BUN 12.0 14.0   CREATININE 0.9 1.0   EGFR >60.0 >60.0   GLUCOSE 304* 310*   CALCIUM 9.0 10.3      Recent Labs  Lab 02/27/14  1005   ALKALINE PHOSPHATASE 135*   BILIRUBIN, TOTAL 0.7   PROTEIN, TOTAL 8.1   ALBUMIN 4.4   ALT 19   AST (SGOT) 13          Recent Labs  Lab 02/27/14  1930   THYROID-STIMULATING HORMONE (TSH) BASELINE 0.38        Recent Labs  Lab 02/28/14  0245   CHOLESTEROL 246*   TRIGLYCERIDES 341*   HDL 27*   CALCULATED LDL 151*          Microbiology Results     None          Discharge Medications:        Medication List      START taking these medications          atorvastatin 40 MG tablet   Commonly known as:  LIPITOR   Take 1 tablet (40 mg total) by mouth nightly.       lisinopril 10 MG tablet   Commonly known as:  PRINIVIL,ZESTRIL   Take 1 tablet (10 mg total) by mouth daily.         CONTINUE taking these medications          metFORMIN 500 MG tablet   Commonly known as:  GLUCOPHAGE         STOP taking these medications          hydrochlorothiazide 12.5 MG tablet   Commonly known as:  HYDRODIURIL         Where to  Get Your Medications     These are the prescriptions that you need to pick up.         You may get the following medications from any pharmacy   -  atorvastatin 40 MG tablet   -  lisinopril 10 MG tablet                      Discharge Instructions:   Disposition: home  with family  Discharge Diet: Cardiac Consistent Carbohydrates          Patient was instructed to follow up with:   Primary Care Doctor Jeral Fruit, MD in  1 week   Complete instructions and follow up are in the patient's After Visit Summary    Minutes spent coordinating discharge and reviewing discharge plan: 25 minutes      Signed by: Charlynn Grimes, MD    CC: Jeral Fruit, MD

## 2014-02-28 NOTE — Plan of Care (Addendum)
Problem: Chest Pain  Goal: Vital signs and cardiac rhythm stable  Outcome: Progressing  A&Ox3; NSR; BP 120/80s; some right chest pressure in am; s/p stress test; CE negative x 3; Blood glucose 219 and 261 , lunch and breakfast;  Plan: pain management; possible discharge this pm;      Pt refused insuline;  Eleveated BP in PM, 179/1003;MD notified;  received additional Hydrochlorothiazide 12.5 mg, will continue to monitor vitals;

## 2014-02-28 NOTE — Plan of Care (Signed)
Problem: Moderate/High Fall Risk Score >/=15  Goal: Patient will remain free of falls  Outcome: Progressing  Pt. Is AAOx 4. OOB to BRM with steady gait. Call bell within reach for assistance. Hourly rounding performed. Will continue fall prevention and interventions.    Problem: Inadequate Tissue Perfusion  Goal: Adequate tissue perfusion will be maintained  Outcome: Progressing  SR on tele. Vitals stable at this time. Pt. Denies chest pain or SOB at this time. Headache relieve after medication administration. Will continue monitoring tele,VS and assess.     Comments:   The patient and care giver's learning abilities have been assessed. Today's individualized plan of care to continue monitoring tele,VS including BP monitoring, Pain management, safety/ fall prevention was discussed with the patient and care giver and agree to it. Patient and care giver demonstrates understanding of disease process, treatment plan, medications and consequences of noncompliance. All questions and concerns were addressed.

## 2014-02-28 NOTE — Progress Notes (Signed)
Lexican nuclear study today normal.  LVEF 72 %.  Echocardiogram unremarkable.    BP labile - was 129/81 earlier but now up to 191/99 and complaining of headache.   On Tprol XL 50 mg daily.  Will add Lisinopril 10 mg qd with first dose tonight.     Jakevious Hollister P. Letitia Neri MD

## 2014-03-01 LAB — HEMOGLOBIN A1C: Hemoglobin A1C: 12.1 % — ABNORMAL HIGH (ref 0.0–6.0)

## 2014-03-01 MED ORDER — LISINOPRIL 10 MG PO TABS
10.0000 mg | ORAL_TABLET | Freq: Every day | ORAL | Status: AC
Start: 2014-03-01 — End: ?

## 2014-03-01 MED ORDER — HYDROCHLOROTHIAZIDE 25 MG PO TABS
25.0000 mg | ORAL_TABLET | Freq: Every day | ORAL | Status: DC
Start: 2014-03-01 — End: 2014-03-01

## 2014-03-01 MED ORDER — HYDROCHLOROTHIAZIDE 12.5 MG PO TABS
12.5000 mg | ORAL_TABLET | Freq: Every day | ORAL | Status: DC
Start: 2014-03-01 — End: 2014-03-01

## 2014-03-01 NOTE — Plan of Care (Signed)
Seen by Dr Keturah Barre. Going instruction discussed with patient and verbalized understanding. Saline lock and tele monitor out. Patient discharged home.

## 2014-03-01 NOTE — Discharge Instructions (Signed)
Noncardiac Chest Pain    Based on your visit today, the health care provider doesn't know what is causing your chest pain. In most cases, people who come to the emergency department with chest pain don't have a problem with their heart. Instead, the pain is caused by other conditions. These may be problems with the lungs, muscles, bones, digestive tract, nerves, or mental health.  Lung problems   Inflammation around the lungs (pleurisy)   Collapsed lung (pneumothorax)   Fluid around the lungs (pleural effusion)   Lung cancer. This is a rare cause of chest pain.  Muscle or bone problems   Inflamed cartilage between the ribs (pleurisy)   Fibromyalgia   Rheumatoid arthritis  Digestive system problems   Reflux   Stomach ulcer   Spasms of the esophagus   Gall stones   Gallbladder inflammation  Mental health conditions   Panic or anxiety attacks   Emotional distress  Your condition doesn't seem serious and your pain doesn't appear to be coming from your heart. But sometimes the signs of a serious problem take more time to appear. Watch for the warning signs listed below.  Home care  Follow these guidelines when caring for yourself at home:   Rest today and avoid strenuous activity.   Take any prescribed medicine as directed.  Follow-up care  Follow up with your health care provider, or as advised, if you don't start to feel better within 24 hours.  When to seek medical advice  Call your health care provider right away if any of these occur:   A change in the type of pain. Call if it feels different, becomes more serious, lasts longer, or begins to spread into your shoulder, arm, neck, jaw, or back.   Shortness of breath   You feel more pain when you breathe   Cough with dark-colored mucus or blood   Weakness, dizziness, or fainting   Fever of 100.4F (38C) or higher, or as directed by your health care provider   Swelling, pain, or redness in one leg     2000-2015 [employer] [address], [place] [address]. All rights reserved. This information is not intended as a substitute for professional medical care. Always follow your healthcare professional's instructions.

## 2014-03-02 LAB — ECG 12-LEAD
Atrial Rate: 80 {beats}/min
P Axis: 75 degrees
P-R Interval: 164 ms
Q-T Interval: 392 ms
QRS Duration: 86 ms
QTC Calculation (Bezet): 452 ms
R Axis: 14 degrees
T Axis: 32 degrees
Ventricular Rate: 80 {beats}/min

## 2015-07-29 ENCOUNTER — Emergency Department (HOSPITAL_COMMUNITY)
Admission: EM | Admit: 2015-07-29 | Discharge: 2015-07-29 | Disposition: A | Discharge status: Home / Self Care | Payer: 59 | Attending: Emergency Medicine | Admitting: Emergency Medicine

## 2015-07-29 ENCOUNTER — Encounter (HOSPITAL_COMMUNITY): Payer: Self-pay

## 2015-07-29 ENCOUNTER — Emergency Department (HOSPITAL_COMMUNITY): Payer: 59

## 2015-07-29 DIAGNOSIS — Z7984 Long term (current) use of oral hypoglycemic drugs: Secondary | ICD-10-CM | POA: Diagnosis not present

## 2015-07-29 DIAGNOSIS — E119 Type 2 diabetes mellitus without complications: Secondary | ICD-10-CM | POA: Insufficient documentation

## 2015-07-29 DIAGNOSIS — E1169 Type 2 diabetes mellitus with other specified complication: Secondary | ICD-10-CM

## 2015-07-29 DIAGNOSIS — R42 Dizziness and giddiness: Secondary | ICD-10-CM | POA: Diagnosis not present

## 2015-07-29 DIAGNOSIS — I10 Essential (primary) hypertension: Secondary | ICD-10-CM | POA: Diagnosis not present

## 2015-07-29 DIAGNOSIS — Z79899 Other long term (current) drug therapy: Secondary | ICD-10-CM | POA: Insufficient documentation

## 2015-07-29 DIAGNOSIS — R0602 Shortness of breath: Secondary | ICD-10-CM | POA: Diagnosis present

## 2015-07-29 HISTORY — DX: Type 2 diabetes mellitus without complications: E11.9

## 2015-07-29 LAB — CBC WITH DIFFERENTIAL/PLATELET
BASOS PCT: 0 %
Basophils Absolute: 0 10*3/uL (ref 0.0–0.1)
EOS ABS: 0.1 10*3/uL (ref 0.0–0.7)
EOS PCT: 2 %
HCT: 38 % (ref 36.0–46.0)
HEMOGLOBIN: 13.2 g/dL (ref 12.0–15.0)
LYMPHS ABS: 4 10*3/uL (ref 0.7–4.0)
Lymphocytes Relative: 58 %
MCH: 28.9 pg (ref 26.0–34.0)
MCHC: 34.7 g/dL (ref 30.0–36.0)
MCV: 83.3 fL (ref 78.0–100.0)
MONO ABS: 0.4 10*3/uL (ref 0.1–1.0)
MONOS PCT: 6 %
NEUTROS PCT: 34 %
Neutro Abs: 2.3 10*3/uL (ref 1.7–7.7)
Platelets: 282 10*3/uL (ref 150–400)
RBC: 4.56 MIL/uL (ref 3.87–5.11)
RDW: 11.9 % (ref 11.5–15.5)
WBC: 6.8 10*3/uL (ref 4.0–10.5)

## 2015-07-29 LAB — CBG MONITORING, ED: GLUCOSE-CAPILLARY: 387 mg/dL — AB (ref 65–99)

## 2015-07-29 LAB — BASIC METABOLIC PANEL
Anion gap: 10 (ref 5–15)
BUN: 9 mg/dL (ref 6–20)
CALCIUM: 9.3 mg/dL (ref 8.9–10.3)
CO2: 24 mmol/L (ref 22–32)
CREATININE: 0.8 mg/dL (ref 0.44–1.00)
Chloride: 100 mmol/L — ABNORMAL LOW (ref 101–111)
GFR calc non Af Amer: >60 mL/min (ref >60)
Glucose, Bld: 443 mg/dL — ABNORMAL HIGH (ref 65–99)
Potassium: 4.3 mmol/L (ref 3.5–5.1)
SODIUM: 134 mmol/L — AB (ref 135–145)

## 2015-07-29 LAB — D-DIMER, QUANTITATIVE (NOT AT ARMC)

## 2015-07-29 MED ORDER — HYDROCHLOROTHIAZIDE 25 MG PO TABS: 25.0000 mg | ORAL_TABLET | Freq: Every day | ORAL | Status: DC

## 2015-07-29 MED ORDER — METFORMIN HCL 500 MG PO TABS: 500.0000 mg | ORAL_TABLET | Freq: Two times a day (BID) | ORAL | Status: DC

## 2015-07-29 NOTE — Discharge Instructions (Signed)
New diagnosis of diabetes and hypertension. Start taking the metformin 500 mg twice a day with meals. Start taking the hydrochlorothiazide for the high blood pressure. Make an appointment to follow-up with the free clinic. Return for any new or worse symptoms.

## 2015-07-29 NOTE — ED Notes (Signed)
Shortness of breath off and on X2 weeks.

## 2015-07-29 NOTE — ED Provider Notes (Signed)
CSN: [id number]     Arrival date & time 07/29/15  1936 History  By signing my name below, I, Dora Sims, attest that this documentation has been prepared under the direction and in the presence of physician practitioner, Fredia Sorrow, MD. Electronically Signed: Dora Sims, Scribe. 07/29/2015. 8:28 PM.    Chief Complaint  Patient presents with  . Shortness of Breath    Patient is a 45 y.o. female presenting with shortness of breath. The history is provided by the patient. No language interpreter was used.  Shortness of Breath Severity:  Unable to specify Onset quality:  Sudden Duration:  2 weeks Timing:  Intermittent Progression:  Unchanged Chronicity:  New Context comment:  Exacerbated by ambulation Relieved by:  Nothing Worsened by:  Activity Associated symptoms: cough (dry)   Associated symptoms: no abdominal pain, no chest pain, no fever, no headaches, no rash, no sore throat and no vomiting   Cough:    Cough characteristics:  Dry   Severity:  Mild   Onset quality:  Sudden   Duration:  2 weeks   Timing:  Intermittent   Progression:  Unchanged   Chronicity:  New Risk factors: no tobacco use      HPI Comments: Crystal Bryant is a 45 y.o. female with PMHx of DM and HTN who presents to the Emergency Department complaining of sudden onset, intermittent, SOB for the last two weeks. She states that her SOB is exacerbated with ambulation. Pt notes associated intermittent lightheadedness, dizziness (lasts for less than 2 minutes), dry cough, and an occasional sharp pain in her right temple (but no prolonged headache). She notes that her lightheadedness has worsened over the last couple of days. No alleviating factors noted. Pt has concern for elevated blood pressure, she has h/o HTN and has not been taking her medications because she is out of her prescriptions. Pt is new to the area and states that she is in the process of finding a PCP. She denies chest pain, fever, hearing  changes, congestion, or any other associated symptoms.  Past Medical History  Diagnosis Date  . Diabetes mellitus without complication [facility]    Past Surgical History  Procedure Laterality Date  . Cesarean section    . Abdominal hysterectomy     No family history on file. Social History  Substance Use Topics  . Smoking status: Never Smoker   . Smokeless tobacco: None  . Alcohol Use: No   OB History    No data available     Review of Systems  Constitutional: Negative for fever and chills.  HENT: Negative for congestion, hearing loss, rhinorrhea and sore throat.   Eyes: Negative for visual disturbance.  Respiratory: Positive for cough (dry) and shortness of breath.   Cardiovascular: Negative for chest pain.  Gastrointestinal: Negative for nausea, vomiting, abdominal pain and diarrhea.  Genitourinary: Negative for dysuria.  Musculoskeletal: Negative for back pain and joint swelling.  Skin: Negative for rash.  Neurological: Positive for dizziness and light-headedness. Negative for headaches.  Hematological: Does not bruise/bleed easily.  Psychiatric/Behavioral: Negative for confusion.   Allergies  Review of patient's allergies indicates no known allergies.  Home Medications   Prior to Admission medications   Medication Sig Start Date End Date Taking? Authorizing Provider  hydrochlorothiazide (HYDRODIURIL) 25 MG tablet Take 1 tablet (25 mg total) by mouth daily. 07/29/15   Fredia Sorrow, MD  metFORMIN (GLUCOPHAGE) 500 MG tablet Take 1 tablet (500 mg total) by mouth 2 (two) times daily with a meal.  07/29/15   Fredia Sorrow, MD   BP 152/99 mmHg  Pulse 79  Temp(Src) 98.7 F (37.1 C) (Oral)  Resp 16  Ht '5\' 5"'$  (1.651 m)  Wt 68.04 kg  BMI 24.96 kg/m2  SpO2 99% Physical Exam  Constitutional: She is oriented to person, place, and time. She appears well-developed and well-nourished. No distress.  HENT:  Head: Normocephalic and atraumatic.  Mouth/Throat: Oropharynx is  clear and moist.  Eyes: Conjunctivae and EOM are normal. Pupils are equal, round, and reactive to light. No scleral icterus.  Sclera are clear bilaterally. Eyes track normally.  Neck: Neck supple. No tracheal deviation present.  Cardiovascular: Normal rate, regular rhythm and normal heart sounds.   No murmur heard. Pulmonary/Chest: Effort normal and breath sounds normal. No respiratory distress.  Lungs are clear bilaterally.  Abdominal: Bowel sounds are normal. There is no tenderness.  Musculoskeletal: Normal range of motion. She exhibits no edema.  No swelling in the ankles.  Neurological: She is alert and oriented to person, place, and time. She has normal reflexes. No cranial nerve deficit. She exhibits normal muscle tone. Coordination normal.  Skin: Skin is warm and dry.  Psychiatric: She has a normal mood and affect. Her behavior is normal.  Nursing note and vitals reviewed.   ED Course  Procedures (including critical care time)  DIAGNOSTIC STUDIES: Oxygen Saturation is 100% on RA, normal by my interpretation.    COORDINATION OF CARE: 8:28 PM Discussed treatment plan with pt at bedside and pt agreed to plan.  Results for orders placed or performed during the hospital encounter of 07/29/15  CBC with Differential/Platelet  Result Value Ref Range   WBC 6.8 4.0 - 10.5 K/uL   RBC 4.56 3.87 - 5.11 MIL/uL   Hemoglobin 13.2 12.0 - 15.0 g/dL   HCT 38.0 36.0 - 46.0 %   MCV 83.3 78.0 - 100.0 fL   MCH 28.9 26.0 - 34.0 pg   MCHC 34.7 30.0 - 36.0 g/dL   RDW 11.9 11.5 - 15.5 %   Platelets 282 150 - 400 K/uL   Neutrophils Relative % 34 %   Neutro Abs 2.3 1.7 - 7.7 K/uL   Lymphocytes Relative 58 %   Lymphs Abs 4.0 0.7 - 4.0 K/uL   Monocytes Relative 6 %   Monocytes Absolute 0.4 0.1 - 1.0 K/uL   Eosinophils Relative 2 %   Eosinophils Absolute 0.1 0.0 - 0.7 K/uL   Basophils Relative 0 %   Basophils Absolute 0.0 0.0 - 0.1 K/uL  Basic metabolic panel  Result Value Ref Range    Sodium 134 (L) 135 - 145 mmol/L   Potassium 4.3 3.5 - 5.1 mmol/L   Chloride 100 (L) 101 - 111 mmol/L   CO2 24 22 - 32 mmol/L   Glucose, Bld 443 (H) 65 - 99 mg/dL   BUN 9 6 - 20 mg/dL   Creatinine, Ser 0.80 0.44 - 1.00 mg/dL   Calcium 9.3 8.9 - 10.3 mg/dL   GFR calc non Af Amer >60 >60 mL/min   GFR calc Af Amer >60 >60 mL/min   Anion gap 10 5 - 15  D-dimer, quantitative (not at [facility])  Result Value Ref Range   D-Dimer, Quant <0.27 0.00 - 0.50 ug/mL-FEU  POC CBG, ED  Result Value Ref Range   Glucose-Capillary 387 (H) 65 - 99 mg/dL   Dg Chest 2 View  07/29/2015  CLINICAL DATA:  Intermittent dyspnea, lightheadedness and nonproductive cough for 2 weeks. EXAM: CHEST  2  VIEW COMPARISON:  None. FINDINGS: Focal opacity in the right upper lobe likely represents superimposition with hypertrophied first costochondral junction but a small nodule cannot be excluded. The lungs are otherwise clear. The pulmonary vasculature is normal. There is no pleural effusion. Heart size is normal. Hilar and mediastinal contours are normal. IMPRESSION: Right upper lobe nodular opacity is probably due to superimposition with the first costochondral junction, but follow-up radiography or CT is necessary to exclude a small lesion. No acute findings. Electronically Signed   By: Andreas Newport M.D.   On: 07/29/2015 21:28    Medications - No data to display  MDM   Final diagnoses:  Type 2 diabetes mellitus with other specified complication [facility])  Essential hypertension    Patient with new onset of diabetes. Blood sugar in the 300 400 range no signs of ketoacidosis. Patient not significantly dehydrated. Patient also with hypertension. told her blood pressure was high in the past.  never been told she had diabetes. Patient admits to increased thirst for several weeks.   Patient's workup for shortness of breath negative d-dimer negative chest x-ray negative. Patient made aware of a superimposed first costochondral  junction opacity nodule that will need follow-up by CT scan. Patient's new to the area she'll be referred to the free clinic. Should be started on metformin and hydrochlorothiazide. Patient did not have any chest pain.  I personally performed the services described in this documentation, which was scribed in my presence. The recorded information has been reviewed and is accurate.    Fredia Sorrow, MD 07/29/15 [phone]

## 2015-09-21 DIAGNOSIS — C3411 Malignant neoplasm of upper lobe, right bronchus or lung: Secondary | ICD-10-CM | POA: Insufficient documentation

## 2015-09-27 ENCOUNTER — Ambulatory Visit: Payer: 59 | Admitting: Cardiovascular Disease

## 2015-09-30 DIAGNOSIS — C3410 Malignant neoplasm of upper lobe, unspecified bronchus or lung: Secondary | ICD-10-CM

## 2015-09-30 HISTORY — DX: Malignant neoplasm of upper lobe, unspecified bronchus or lung: C34.10

## 2015-10-06 HISTORY — PX: LOBECTOMY: SHX5089

## 2015-10-25 DIAGNOSIS — Z4889 Encounter for other specified surgical aftercare: Secondary | ICD-10-CM | POA: Insufficient documentation

## 2016-01-24 ENCOUNTER — Emergency Department (HOSPITAL_COMMUNITY): Payer: 59

## 2016-01-24 ENCOUNTER — Emergency Department (HOSPITAL_COMMUNITY)
Admission: EM | Admit: 2016-01-24 | Discharge: 2016-01-24 | Disposition: A | Discharge status: Home / Self Care | Payer: 59 | Attending: Emergency Medicine | Admitting: Emergency Medicine

## 2016-01-24 ENCOUNTER — Encounter (HOSPITAL_COMMUNITY): Payer: Self-pay | Admitting: Emergency Medicine

## 2016-01-24 DIAGNOSIS — E119 Type 2 diabetes mellitus without complications: Secondary | ICD-10-CM | POA: Diagnosis not present

## 2016-01-24 DIAGNOSIS — I10 Essential (primary) hypertension: Secondary | ICD-10-CM | POA: Diagnosis not present

## 2016-01-24 DIAGNOSIS — R0602 Shortness of breath: Secondary | ICD-10-CM | POA: Diagnosis present

## 2016-01-24 DIAGNOSIS — R05 Cough: Secondary | ICD-10-CM | POA: Diagnosis not present

## 2016-01-24 DIAGNOSIS — R0609 Other forms of dyspnea: Secondary | ICD-10-CM

## 2016-01-24 DIAGNOSIS — R059 Cough, unspecified: Secondary | ICD-10-CM

## 2016-01-24 DIAGNOSIS — Z79899 Other long term (current) drug therapy: Secondary | ICD-10-CM | POA: Insufficient documentation

## 2016-01-24 DIAGNOSIS — R079 Chest pain, unspecified: Secondary | ICD-10-CM | POA: Insufficient documentation

## 2016-01-24 DIAGNOSIS — Z85118 Personal history of other malignant neoplasm of bronchus and lung: Secondary | ICD-10-CM | POA: Insufficient documentation

## 2016-01-24 DIAGNOSIS — Z7984 Long term (current) use of oral hypoglycemic drugs: Secondary | ICD-10-CM | POA: Insufficient documentation

## 2016-01-24 LAB — COMPREHENSIVE METABOLIC PANEL
ALT: 20 U/L (ref 14–54)
AST: 15 U/L (ref 15–41)
Albumin: 4 g/dL (ref 3.5–5.0)
Alkaline Phosphatase: 146 U/L — ABNORMAL HIGH (ref 38–126)
Anion gap: 8 (ref 5–15)
BILIRUBIN TOTAL: 0.4 mg/dL (ref 0.3–1.2)
BUN: 14 mg/dL (ref 6–20)
CALCIUM: 8.8 mg/dL — AB (ref 8.9–10.3)
CO2: 23 mmol/L (ref 22–32)
Chloride: 101 mmol/L (ref 101–111)
Creatinine, Ser: 0.71 mg/dL (ref 0.44–1.00)
GFR calc Af Amer: >60 mL/min (ref >60)
Glucose, Bld: 316 mg/dL — ABNORMAL HIGH (ref 65–99)
POTASSIUM: 3.4 mmol/L — AB (ref 3.5–5.1)
Sodium: 132 mmol/L — ABNORMAL LOW (ref 135–145)
TOTAL PROTEIN: 8.3 g/dL — AB (ref 6.5–8.1)

## 2016-01-24 LAB — CBC
HEMATOCRIT: 41 % (ref 36.0–46.0)
Hemoglobin: 13.6 g/dL (ref 12.0–15.0)
MCH: 27.4 pg (ref 26.0–34.0)
MCHC: 33.2 g/dL (ref 30.0–36.0)
MCV: 82.7 fL (ref 78.0–100.0)
Platelets: 372 10*3/uL (ref 150–400)
RBC: 4.96 MIL/uL (ref 3.87–5.11)
RDW: 12.7 % (ref 11.5–15.5)
WBC: 10.8 10*3/uL — AB (ref 4.0–10.5)

## 2016-01-24 LAB — I-STAT TROPONIN, ED: Troponin i, poc: 0 ng/mL (ref 0.00–0.08)

## 2016-01-24 LAB — BRAIN NATRIURETIC PEPTIDE: B NATRIURETIC PEPTIDE 5: 42 pg/mL (ref 0.0–100.0)

## 2016-01-24 MED ORDER — IOPAMIDOL (ISOVUE-370) INJECTION 76%
100.0000 mL | Freq: Once | INTRAVENOUS | Status: AC | PRN
  Administered 2016-01-24: 100 mL via INTRAVENOUS

## 2016-01-24 NOTE — ED Triage Notes (Signed)
Pt reports increasing SOB x 4 days. Pt reports cough that is occasionally productive. Pt reports intermittent chest pain. Pt had a R lobectomy in Sept for lung cancer. Pt refuses EKG, states they are always abnormal and she is scheduled to see a cardiologist.

## 2016-01-24 NOTE — ED Provider Notes (Signed)
[place] DEPT Provider Note   CSN: [id number] Arrival date & time: 01/24/16  1707     History   Chief Complaint Chief Complaint  Patient presents with  . Shortness of Breath    HPI Crystal Bryant is a 45 y.o. female.  The history is provided by the patient.  Shortness of Breath  This is a new problem. The average episode lasts 2 weeks. The current episode started more than 1 week ago. The problem has been gradually improving. Associated symptoms include cough (dry), sputum production, orthopnea and chest pain (intermittent right chest soreness; nonradiating; is exertional). Pertinent negatives include no fever, no rhinorrhea, no syncope and no leg swelling. Risk factors: high blood pressure. Associated medical issues comments: HTN; recent lobectomy for cancer.   No chemo or radiation. Only surveillance for now.   Past Medical History:  Diagnosis Date  . Cancer ([place])    lung  . Diabetes mellitus without complication ([place])     There are no active problems to display for this patient.   Past Surgical History:  Procedure Laterality Date  . ABDOMINAL HYSTERECTOMY    . CESAREAN SECTION    . LOBECTOMY      OB History    Gravida Para Term Preterm AB Living   '2 2 2         '$ SAB TAB Ectopic Multiple Live Births                   Home Medications    Prior to Admission medications   Medication Sig Start Date End Date Taking? Authorizing Provider  metFORMIN (GLUCOPHAGE) 500 MG tablet Take 1 tablet (500 mg total) by mouth 2 (two) times daily with a meal. 07/29/15  Yes Fredia Sorrow, MD  metoprolol tartrate (LOPRESSOR) 25 MG tablet Take 25 mg by mouth 2 (two) times daily.   Yes Historical Provider, MD    Family History History reviewed. No pertinent family history.  Social History Social History  Substance Use Topics  . Smoking status: Never Smoker  . Smokeless tobacco: Never Used  . Alcohol use No     Allergies   Patient has no known  allergies.   Review of Systems Review of Systems  Constitutional: Negative for fever.  HENT: Negative for rhinorrhea.   Respiratory: Positive for cough (dry), sputum production and shortness of breath.   Cardiovascular: Positive for chest pain (intermittent right chest soreness; nonradiating; is exertional) and orthopnea. Negative for leg swelling and syncope.  Ten systems are reviewed and are negative for acute change except as noted in the HPI    Physical Exam Updated Vital Signs BP (!) 202/117 (BP Location: Left Arm)   Pulse 117   Temp 99 F (37.2 C) (Oral)   Resp 19   Ht '5\' 5"'$  (1.651 m)   Wt 155 lb (70.3 kg)   SpO2 99%   BMI 25.79 kg/m   Physical Exam  Constitutional: She is oriented to person, place, and time. She appears well-developed and well-nourished. No distress.  HENT:  Head: Normocephalic and atraumatic.  Nose: Nose normal.  Eyes: Conjunctivae and EOM are normal. Pupils are equal, round, and reactive to light. Right eye exhibits no discharge. Left eye exhibits no discharge. No scleral icterus.  Neck: Normal range of motion. Neck supple.  Cardiovascular: Normal rate and regular rhythm.  Exam reveals no gallop and no friction rub.   No murmur heard. Pulmonary/Chest: Effort normal and breath sounds normal. No stridor. No respiratory distress. She has  no rales.  Abdominal: Soft. She exhibits no distension. There is no tenderness.  Musculoskeletal: She exhibits no edema or tenderness.  Neurological: She is alert and oriented to person, place, and time.  Skin: Skin is warm and dry. No rash noted. She is not diaphoretic. No erythema.  Psychiatric: She has a normal mood and affect.  Vitals reviewed.    ED Treatments / Results  Labs (all labs ordered are listed, but only abnormal results are displayed) Labs Reviewed  CBC - Abnormal; Notable for the following:       Result Value   WBC 10.8 (*)    All other components within normal limits  COMPREHENSIVE  METABOLIC PANEL - Abnormal; Notable for the following:    Sodium 132 (*)    Potassium 3.4 (*)    Glucose, Bld 316 (*)    Calcium 8.8 (*)    Total Protein 8.3 (*)    Alkaline Phosphatase 146 (*)    All other components within normal limits  BRAIN NATRIURETIC PEPTIDE  I-STAT TROPOININ, ED    EKG  EKG Interpretation  Date/Time:  Tuesday January 24 2016 18:37:49 EST Ventricular Rate:  108 PR Interval:    QRS Duration: 125 QT Interval:  364 QTC Calculation: 488 R Axis:   2 Text Interpretation:  Sinus tachycardia Left bundle branch block No significant change since last tracing Confirmed by [facility] MD, Dameian Crisman ([id number]) on 01/24/2016 7:05:25 PM       Radiology Ct Angio Chest Pe W Or Wo Contrast  Result Date: 01/24/2016 CLINICAL DATA:  Acute onset of dyspnea. Shortness of breath and productive cough. Intermittent generalized chest pain. Initial encounter. EXAM: CT ANGIOGRAPHY CHEST WITH CONTRAST TECHNIQUE: Multidetector CT imaging of the chest was performed using the standard protocol during bolus administration of intravenous contrast. Multiplanar CT image reconstructions and MIPs were obtained to evaluate the vascular anatomy. CONTRAST:  100 mL of Isovue 370 IV contrast COMPARISON:  Chest radiograph performed 10/13/2015 FINDINGS: Cardiovascular: There is no evidence of pulmonary embolus. The heart is normal in size. No calcific atherosclerotic disease is seen. The great vessels are grossly unremarkable in appearance. Mediastinum/Nodes: The mediastinum is unremarkable in appearance. No mediastinal lymphadenopathy is seen. The thyroid gland is unremarkable. No axillary lymphadenopathy is seen. Lungs/Pleura: Mild bibasilar scarring is noted, with associated architectural distortion. No focal mass is identified. No pleural effusion or pneumothorax is seen. Upper Abdomen: The visualized portions of the liver and spleen are grossly unremarkable. The visualized portions of the pancreas, adrenal  glands and kidneys are within normal limits. Musculoskeletal: No acute osseous abnormalities are identified. The visualized musculature is unremarkable in appearance. Review of the MIP images confirms the above findings. IMPRESSION: 1. No evidence of pulmonary embolus. 2. Mild basilar scarring.  Lungs otherwise clear. Electronically Signed   By: Garald Balding M.D.   On: 01/24/2016 20:56    Procedures Procedures (including critical care time)  Medications Ordered in ED Medications  iopamidol (ISOVUE-370) 76 % injection 100 mL (100 mLs Intravenous Contrast Given 01/24/16 2007)     Initial Impression / Assessment and Plan / ED Course  I have reviewed the triage vital signs and the nursing notes.  Pertinent labs & imaging results that were available during my care of the patient were reviewed by me and considered in my medical decision making (see chart for details).  Clinical Course     1. Cough and DOE No evidence of volume overload suggestive of heart failure, which was confirmed by normal BNP.  EKG without acute ischemic changes. Troponin negative. Given patient's history of cancer, CT PE study was obtained to rule out PE and obtained detailed review of the lungs. CT revealed no evidence of PE, pulmonary edema, pneumonia, pleural effusions, pulmonary fibrosis. No additional lesions noted.   2. HTN Workup without evidence of end organ damage. We'll have patient follow-up with primary care provider for continued management.  3. Hyperglycemia No evidence of DKA. Stable. pcp follow up for continued management.  Final Clinical Impressions(s) / ED Diagnoses   Final diagnoses:  Dyspnea on exertion  Cough  Hypertension, unspecified type   Disposition: Discharge  Condition: Good  I have discussed the results, Dx and Tx plan with the patient who expressed understanding and agree(s) with the plan. Discharge instructions discussed at great length. The patient was given strict return  precautions who verbalized understanding of the instructions. No further questions at time of discharge.    Discharge Medication List as of 01/24/2016  9:52 PM      Follow Up: [facility] [place] [address] [phone]  Schedule an appointment as soon as possible for a visit  in 3-5 days, If symptoms do not improve or  worsen      Fatima Blank, MD 01/24/16 2227

## 2016-12-15 ENCOUNTER — Encounter: Payer: Self-pay | Admitting: Family Medicine

## 2016-12-15 ENCOUNTER — Ambulatory Visit (INDEPENDENT_AMBULATORY_CARE_PROVIDER_SITE_OTHER): Payer: 59 | Admitting: Family Medicine

## 2016-12-15 VITALS — BP 168/108 | HR 100 | Temp 98.4°F | Resp 16 | Ht 65.0 in | Wt 145.8 lb

## 2016-12-15 DIAGNOSIS — I10 Essential (primary) hypertension: Secondary | ICD-10-CM | POA: Diagnosis not present

## 2016-12-15 DIAGNOSIS — R8290 Unspecified abnormal findings in urine: Secondary | ICD-10-CM

## 2016-12-15 DIAGNOSIS — E118 Type 2 diabetes mellitus with unspecified complications: Secondary | ICD-10-CM | POA: Diagnosis not present

## 2016-12-15 DIAGNOSIS — Z5181 Encounter for therapeutic drug level monitoring: Secondary | ICD-10-CM

## 2016-12-15 DIAGNOSIS — C3411 Malignant neoplasm of upper lobe, right bronchus or lung: Secondary | ICD-10-CM

## 2016-12-15 LAB — POCT URINALYSIS DIP (MANUAL ENTRY)
Bilirubin, UA: NEGATIVE
Glucose, UA: >=1000 mg/dL — AB
NITRITE UA: NEGATIVE
PROTEIN UA: NEGATIVE mg/dL
Spec Grav, UA: 1.02 (ref 1.010–1.025)
Urobilinogen, UA: 0.2 E.U./dL
pH, UA: 6 (ref 5.0–8.0)

## 2016-12-15 LAB — POC MICROSCOPIC URINALYSIS (UMFC): MUCUS RE: ABSENT

## 2016-12-15 LAB — POCT GLYCOSYLATED HEMOGLOBIN (HGB A1C): Hemoglobin A1C: >14

## 2016-12-15 MED ORDER — OLMESARTAN MEDOXOMIL-HCTZ 40-25 MG PO TABS: 1.0000 | ORAL_TABLET | Freq: Every day | ORAL | 1 refills | Status: DC

## 2016-12-15 MED ORDER — CEPHALEXIN 500 MG PO CAPS: 500.0000 mg | ORAL_CAPSULE | Freq: Two times a day (BID) | ORAL | 0 refills | Status: DC

## 2016-12-15 MED ORDER — SITAGLIPTIN PHOS-METFORMIN HCL 50-1000 MG PO TABS: 1.0000 | ORAL_TABLET | Freq: Two times a day (BID) | ORAL | 3 refills | Status: DC

## 2016-12-15 MED ORDER — CARVEDILOL 6.25 MG PO TABS: 6.2500 mg | ORAL_TABLET | Freq: Two times a day (BID) | ORAL | 1 refills | Status: DC

## 2016-12-15 NOTE — Patient Instructions (Addendum)
   IF you received an x-ray today, you will receive an invoice from [facility]. Please contact [facility] at [phone] with questions or concerns regarding your invoice.   IF you received labwork today, you will receive an invoice from [facility]. Please contact [facility] at [phone] with questions or concerns regarding your invoice.   Our billing staff will not be able to assist you with questions regarding bills from these companies.  You will be contacted with the lab results as soon as they are available. The fastest way to get your results is to activate your My Chart account. Instructions are located on the last page of this paperwork. If you have not heard from us regarding the results in 2 weeks, please contact this office.      Managing Your Hypertension Hypertension is commonly called high blood pressure. This is when the force of your blood pressing against the walls of your arteries is too strong. Arteries are blood vessels that carry blood from your heart throughout your body. Hypertension forces the heart to work harder to pump blood, and may cause the arteries to become narrow or stiff. Having untreated or uncontrolled hypertension can cause heart attack, stroke, kidney disease, and other problems. What are blood pressure readings? A blood pressure reading consists of a higher number over a lower number. Ideally, your blood pressure should be below 120/80. The first ("top") number is called the systolic pressure. It is a measure of the pressure in your arteries as your heart beats. The second ("bottom") number is called the diastolic pressure. It is a measure of the pressure in your arteries as the heart relaxes. What does my blood pressure reading mean? Blood pressure is classified into four stages. Based on your blood pressure reading, your health care provider may use the following stages to determine what type of treatment you need, if any. Systolic  pressure and diastolic pressure are measured in a unit called mm Hg. Normal  Systolic pressure: below 120.  Diastolic pressure: below 80. Elevated  Systolic pressure: 120-129.  Diastolic pressure: below 80. Hypertension stage 1  Systolic pressure: 130-139.  Diastolic pressure: 80-89. Hypertension stage 2  Systolic pressure: 140 or above.  Diastolic pressure: 90 or above. What health risks are associated with hypertension? Managing your hypertension is an important responsibility. Uncontrolled hypertension can lead to:  A heart attack.  A stroke.  A weakened blood vessel (aneurysm).  Heart failure.  Kidney damage.  Eye damage.  Metabolic syndrome.  Memory and concentration problems.  What changes can I make to manage my hypertension? Hypertension can be managed by making lifestyle changes and possibly by taking medicines. Your health care provider will help you make a plan to bring your blood pressure within a normal range. Eating and drinking  Eat a diet that is high in fiber and potassium, and low in salt (sodium), added sugar, and fat. An example eating plan is called the DASH (Dietary Approaches to Stop Hypertension) diet. To eat this way: ? Eat plenty of fresh fruits and vegetables. Try to fill half of your plate at each meal with fruits and vegetables. ? Eat whole grains, such as whole wheat pasta, brown rice, or whole grain bread. Fill about one quarter of your plate with whole grains. ? Eat low-fat diary products. ? Avoid fatty cuts of meat, processed or cured meats, and poultry with skin. Fill about one quarter of your plate with lean proteins such as fish, chicken without skin, beans, eggs,   and tofu. ? Avoid premade and processed foods. These tend to be higher in sodium, added sugar, and fat.  Reduce your daily sodium intake. Most people with hypertension should eat less than 1,500 mg of sodium a day.  Limit alcohol intake to no more than 1 drink a day  for nonpregnant women and 2 drinks a day for men. One drink equals 12 oz of beer, 5 oz of wine, or 1 oz of hard liquor. Lifestyle  Work with your health care provider to maintain a healthy body weight, or to lose weight. Ask what an ideal weight is for you.  Get at least 30 minutes of exercise that causes your heart to beat faster (aerobic exercise) most days of the week. Activities may include walking, swimming, or biking.  Include exercise to strengthen your muscles (resistance exercise), such as weight lifting, as part of your weekly exercise routine. Try to do these types of exercises for 30 minutes at least 3 days a week.  Do not use any products that contain nicotine or tobacco, such as cigarettes and e-cigarettes. If you need help quitting, ask your health care provider.  Control any long-term (chronic) conditions you have, such as high cholesterol or diabetes. Monitoring  Monitor your blood pressure at home as told by your health care provider. Your personal target blood pressure may vary depending on your medical conditions, your age, and other factors.  Have your blood pressure checked regularly, as often as told by your health care provider. Working with your health care provider  Review all the medicines you take with your health care provider because there may be side effects or interactions.  Talk with your health care provider about your diet, exercise habits, and other lifestyle factors that may be contributing to hypertension.  Visit your health care provider regularly. Your health care provider can help you create and adjust your plan for managing hypertension. Will I need medicine to control my blood pressure? Your health care provider may prescribe medicine if lifestyle changes are not enough to get your blood pressure under control, and if:  Your systolic blood pressure is 130 or higher.  Your diastolic blood pressure is 80 or higher.  Take medicines only as told  by your health care provider. Follow the directions carefully. Blood pressure medicines must be taken as prescribed. The medicine does not work as well when you skip doses. Skipping doses also puts you at risk for problems. Contact a health care provider if:  You think you are having a reaction to medicines you have taken.  You have repeated (recurrent) headaches.  You feel dizzy.  You have swelling in your ankles.  You have trouble with your vision. Get help right away if:  You develop a severe headache or confusion.  You have unusual weakness or numbness, or you feel faint.  You have severe pain in your chest or abdomen.  You vomit repeatedly.  You have trouble breathing. Summary  Hypertension is when the force of blood pumping through your arteries is too strong. If this condition is not controlled, it may put you at risk for serious complications.  Your personal target blood pressure may vary depending on your medical conditions, your age, and other factors. For most people, a normal blood pressure is less than 120/80.  Hypertension is managed by lifestyle changes, medicines, or both. Lifestyle changes include weight loss, eating a healthy, low-sodium diet, exercising more, and limiting alcohol. This information is not intended to replace advice   given to you by your health care provider. Make sure you discuss any questions you have with your health care provider. Document Released: 10/10/2011 Document Revised: 12/14/2015 Document Reviewed: 12/14/2015 Elsevier Interactive Patient Education  2018 [employer]  

## 2016-12-15 NOTE — Progress Notes (Signed)
Subjective:  By signing my name below, I, Crystal Bryant, attest that this documentation has been prepared under the direction and in the presence of [name], MD. Electronically Signed: Moises Bryant, [place]. 12/15/2016 , 9:31 AM .  Patient was seen in Room 2 .   Patient ID: Crystal Bryant, female    DOB: Mar 10, 1970, 46 y.o.   MRN: [id number] Chief Complaint  Patient presents with  . Establish Care    Diabetes, Hypertension, (pt took Bp med today, bp consistently elevated)  . Urinary Tract Infection    Urine odor  . Medication Refill    All. Change losartan    HPI Crystal Bryant is a 46 y.o. female who presents to Primary Care at [facility] to establish care. She was previously followed by [facility] in [place], [place]. Her last Bryant work was done about a year ago. She's taken her medications this morning.   Patient is followed by cardiologist, Dr. [name], and had echocardiogram done in April 2018, which showed normal left ventricular size with mild concentric left ventricular hypertrophy, and mildly reduced function, EF 45-50%; mild grade 1 diastolic dysfunction; the tricuspid regurgitation envelope was only faintly visualized but pulmonary artery pressure does not appear to be elevated; and mild mitral regurgitation and tricuspid regurgitation.   She has a history of lung cancer with right upper lobectomy done in Sept. She is due for a CT. She was followed by Dr. [name] at [facility]. She notes having shortness of breath recently, difficult to catch a breath; believes it could be caused by change in weather.   She checks her Bryant sugars in the mornings, running in the 300s. She's still taking her medications. She notes occasional diarrhea with the metformin.   She checks her BP at [facility] and also at home, usually running around 180s-190s/90s-100s with heart rate normally around 70s-80s. She's taken her medication this morning.   She's also been having some pain and  discomfort with nocturia, and an odd smell to her urine that started about a week ago. She denies history of pancreatic issues, liver issues or kidney issues.   Past Medical History:  Diagnosis Date  . Anxiety   . Cancer ([place])    lung  . Diabetes mellitus without complication ([place])   . Hypertension    Prior to Admission medications   Medication Sig Start Date End Date Taking? Authorizing Provider  metFORMIN (GLUCOPHAGE) 500 MG tablet Take 1 tablet (500 mg total) by mouth 2 (two) times daily with a meal. 07/29/15   Fredia Sorrow, MD  metoprolol tartrate (LOPRESSOR) 25 MG tablet Take 25 mg by mouth 2 (two) times daily.    [provider]   No Known Allergies   Past Surgical History:  Procedure Laterality Date  . ABDOMINAL HYSTERECTOMY    . BREAST SURGERY    . CESAREAN SECTION    . LOBECTOMY     Family History  Problem Relation Age of Onset  . Diabetes Mother   . Hypertension Mother   . Stroke Father    Social History   Socioeconomic History  . Marital status: Divorced    Spouse name: None  . Number of children: None  . Years of education: None  . Highest education level: None  Social Needs  . Financial resource strain: None  . Food insecurity - worry: None  . Food insecurity - inability: None  . Transportation needs - medical: None  . Transportation needs - non-medical: None  Occupational  History  . None  Tobacco Use  . Smoking status: Never Smoker  . Smokeless tobacco: Never Used  Substance and Sexual Activity  . Alcohol use: No  . Drug use: No  . Sexual activity: Yes    Birth control/protection: None  Other Topics Concern  . None  Social History Narrative  . None   Depression screen [facility] 2/9 12/15/2016  Decreased Interest 0  Down, Depressed, Hopeless 0  PHQ - 2 Score 0    Review of Systems  Constitutional: Negative for fatigue and unexpected weight change.  Respiratory: Positive for shortness of breath. Negative for chest tightness.     Cardiovascular: Negative for chest pain, palpitations and leg swelling.  Gastrointestinal: Negative for abdominal pain and Bryant in stool.  Genitourinary: Positive for dysuria.  Neurological: Negative for dizziness, syncope, light-headedness and headaches.       Objective:   Physical Exam  Constitutional: She is oriented to person, place, and time. She appears well-developed and well-nourished. No distress.  HENT:  Head: Normocephalic and atraumatic.  Eyes: EOM are normal. Pupils are equal, round, and reactive to light.  Neck: Neck supple.  Cardiovascular: Regular rhythm. Tachycardia present.  Murmur (left upper sternal border) heard.  Systolic murmur is present with a grade of 2/6. Pulmonary/Chest: Effort normal and breath sounds normal. No respiratory distress. She has no wheezes.  Musculoskeletal: Normal range of motion.  Neurological: She is alert and oriented to person, place, and time.  Skin: Skin is warm and dry.  Psychiatric: She has a normal mood and affect. Her behavior is normal.  Nursing note and vitals reviewed.   BP (!) 168/108   Pulse 100   Temp 98.4 F (36.9 C) (Oral)   Resp 16   Ht 5\' 5"  (1.651 m)   Wt 145 lb 12.8 oz (66.1 kg)   SpO2 99%   BMI 24.26 kg/m   Results for orders placed or performed in visit on 12/15/16  POCT urinalysis dipstick  Result Value Ref Range   Color, UA yellow yellow   Clarity, UA clear clear   Glucose, UA >=1,000 (A) negative mg/dL   Bilirubin, UA negative negative   Ketones, POC UA trace (5) (A) negative mg/dL   Spec Grav, UA 1.020 1.010 - 1.025   Bryant, UA trace-intact (A) negative   pH, UA 6.0 5.0 - 8.0   Protein Ur, POC negative negative mg/dL   Urobilinogen, UA 0.2 0.2 or 1.0 E.U./dL   Nitrite, UA Negative Negative   Leukocytes, UA Small (1+) (A) Negative  POCT Microscopic Urinalysis ([facility])  Result Value Ref Range   WBC,UR,HPF,POC Too numerous to count  (A) None WBC/hpf   RBC,UR,HPF,POC None None RBC/hpf   Bacteria  Many (A) None, Too numerous to count   Mucus Absent Absent   Epithelial Cells, UR Per Microscopy Few (A) None, Too numerous to count cells/hpf       Assessment & Plan:   1. Abnormal urine odor   2. Type 2 diabetes mellitus with complication, without long-term current use of insulin ([place])   3. Essential hypertension   4. Medication monitoring encounter   5. Malignant neoplasm of upper lobe of right lung ([place])     Orders Placed This Encounter  Procedures  . Urine Culture  . Comprehensive metabolic panel  . Microalbumin/Creatinine Ratio, Urine  . Lipid panel    Order Specific Question:   Has the patient fasted?    Answer:   Yes  . Ambulatory referral to Oncology  Referral Priority:   Routine    Referral Type:   Consultation    Referral Reason:   Specialty Services Required    Number of Visits Requested:   1  . POCT urinalysis dipstick  . POCT Microscopic Urinalysis ([facility])  . POCT glycosylated hemoglobin (Hb A1C)    Meds ordered this encounter  Medications  . DISCONTD: losartan-hydrochlorothiazide (HYZAAR) 100-25 MG tablet  . ALPRAZolam (XANAX) 0.5 MG tablet    Sig: Take 0.5 mg as needed by mouth for anxiety.  . sitaGLIPtin-metformin (JANUMET) 50-1000 MG tablet    Sig: Take 1 tablet 2 (two) times daily with a meal by mouth.    Dispense:  60 tablet    Refill:  3  . carvedilol (COREG) 6.25 MG tablet    Sig: Take 1 tablet (6.25 mg total) 2 (two) times daily with a meal by mouth.    Dispense:  60 tablet    Refill:  1  . olmesartan-hydrochlorothiazide (BENICAR HCT) 40-25 MG tablet    Sig: Take 1 tablet daily by mouth.    Dispense:  30 tablet    Refill:  1  . cephALEXin (KEFLEX) 500 MG capsule    Sig: Take 1 capsule (500 mg total) 2 (two) times daily by mouth.    Dispense:  14 capsule    Refill:  0    I personally performed the services described in this documentation, which was scribed in my presence. The recorded information has been reviewed and considered, and  addended by me as needed.   [name], M.D.  Primary Care at [facility] [address] [place], [place] [address] [phone] phone [phone] fax  12/15/16 11:45 PM

## 2016-12-16 LAB — COMPREHENSIVE METABOLIC PANEL
A/G RATIO: 1.2 (ref 1.2–2.2)
ALBUMIN: 4.1 g/dL (ref 3.5–5.5)
ALT: 10 IU/L (ref 0–32)
AST: 9 IU/L (ref 0–40)
Alkaline Phosphatase: 141 IU/L — ABNORMAL HIGH (ref 39–117)
BILIRUBIN TOTAL: 0.4 mg/dL (ref 0.0–1.2)
BUN/Creatinine Ratio: 15 (ref 9–23)
BUN: 11 mg/dL (ref 6–24)
CALCIUM: 9.9 mg/dL (ref 8.7–10.2)
CO2: 24 mmol/L (ref 20–29)
CREATININE: 0.71 mg/dL (ref 0.57–1.00)
Chloride: 99 mmol/L (ref 96–106)
GFR calc Af Amer: 120 mL/min/{1.73_m2} (ref >59)
GFR calc non Af Amer: 104 mL/min/{1.73_m2} (ref >59)
GLOBULIN, TOTAL: 3.3 g/dL (ref 1.5–4.5)
Glucose: 308 mg/dL — ABNORMAL HIGH (ref 65–99)
Potassium: 4.4 mmol/L (ref 3.5–5.2)
Sodium: 138 mmol/L (ref 134–144)
TOTAL PROTEIN: 7.4 g/dL (ref 6.0–8.5)

## 2016-12-16 LAB — LIPID PANEL
CHOLESTEROL TOTAL: 278 mg/dL — AB (ref 100–199)
Chol/HDL Ratio: 6.2 ratio — ABNORMAL HIGH (ref 0.0–4.4)
HDL: 45 mg/dL (ref >39)
LDL Calculated: 197 mg/dL — ABNORMAL HIGH (ref 0–99)
TRIGLYCERIDES: 182 mg/dL — AB (ref 0–149)
VLDL Cholesterol Cal: 36 mg/dL (ref 5–40)

## 2016-12-16 LAB — MICROALBUMIN / CREATININE URINE RATIO
CREATININE, UR: 88.8 mg/dL
MICROALBUM., U, RANDOM: 26.3 ug/mL
Microalb/Creat Ratio: 29.6 mg/g creat (ref 0.0–30.0)

## 2016-12-17 MED FILL — JANUMET 50-1,000 MG TABLET: 50-1000 | 30 days supply | Qty: 60 | Fill #0

## 2016-12-17 MED FILL — OLMESARTAN-HCTZ 40-25 MG TA: 40-25 | 30 days supply | Qty: 30 | Fill #0

## 2016-12-17 MED FILL — CARVEDILOL 6.25 MG TABLET: 6.25 | 30 days supply | Qty: 60 | Fill #0

## 2016-12-17 MED FILL — CEPHALEXIN 500 MG CAPSULE: 500 | 7 days supply | Qty: 14 | Fill #0

## 2016-12-18 LAB — URINE CULTURE

## 2016-12-29 MED ORDER — ATORVASTATIN CALCIUM 40 MG PO TABS: 40.0000 mg | ORAL_TABLET | Freq: Every day | ORAL | 0 refills | Status: DC

## 2016-12-29 NOTE — Addendum Note (Signed)
Addended by: Shawnee Knapp on: 12/29/2016 10:05 AM   Modules accepted: Orders

## 2017-01-05 ENCOUNTER — Encounter: Payer: Self-pay | Admitting: Family Medicine

## 2017-01-05 ENCOUNTER — Other Ambulatory Visit: Payer: Self-pay

## 2017-01-05 ENCOUNTER — Ambulatory Visit (INDEPENDENT_AMBULATORY_CARE_PROVIDER_SITE_OTHER): Payer: 59 | Admitting: Family Medicine

## 2017-01-05 VITALS — BP 160/90 | HR 103 | Temp 98.5°F | Resp 16 | Ht 64.96 in | Wt 150.0 lb

## 2017-01-05 DIAGNOSIS — C3411 Malignant neoplasm of upper lobe, right bronchus or lung: Secondary | ICD-10-CM

## 2017-01-05 DIAGNOSIS — Z5181 Encounter for therapeutic drug level monitoring: Secondary | ICD-10-CM | POA: Diagnosis not present

## 2017-01-05 DIAGNOSIS — I10 Essential (primary) hypertension: Secondary | ICD-10-CM

## 2017-01-05 DIAGNOSIS — E785 Hyperlipidemia, unspecified: Secondary | ICD-10-CM | POA: Diagnosis not present

## 2017-01-05 DIAGNOSIS — N390 Urinary tract infection, site not specified: Secondary | ICD-10-CM | POA: Diagnosis not present

## 2017-01-05 DIAGNOSIS — R310 Gross hematuria: Secondary | ICD-10-CM

## 2017-01-05 DIAGNOSIS — Z85118 Personal history of other malignant neoplasm of bronchus and lung: Secondary | ICD-10-CM

## 2017-01-05 DIAGNOSIS — E1165 Type 2 diabetes mellitus with hyperglycemia: Secondary | ICD-10-CM | POA: Diagnosis not present

## 2017-01-05 DIAGNOSIS — R319 Hematuria, unspecified: Secondary | ICD-10-CM

## 2017-01-05 DIAGNOSIS — Z902 Acquired absence of lung [part of]: Secondary | ICD-10-CM

## 2017-01-05 LAB — POC MICROSCOPIC URINALYSIS (UMFC): Mucus: ABSENT

## 2017-01-05 LAB — POCT URINALYSIS DIP (MANUAL ENTRY)
BILIRUBIN UA: NEGATIVE mg/dL
Bilirubin, UA: NEGATIVE
Glucose, UA: 500 mg/dL — AB
Nitrite, UA: NEGATIVE
PH UA: 5.5 (ref 5.0–8.0)
PROTEIN UA: NEGATIVE mg/dL
SPEC GRAV UA: 1.02 (ref 1.010–1.025)
Urobilinogen, UA: 0.2 E.U./dL

## 2017-01-05 LAB — POCT CBC
Granulocyte percent: 50.7 %G (ref 37–80)
HEMATOCRIT: 37.8 % (ref 37.7–47.9)
HEMOGLOBIN: 12.9 g/dL (ref 12.2–16.2)
LYMPH, POC: 3.1 (ref 0.6–3.4)
MCH: 28.5 pg (ref 27–31.2)
MCHC: 34.1 g/dL (ref 31.8–35.4)
MCV: 83.7 fL (ref 80–97)
MID (cbc): 0.2 (ref 0–0.9)
MPV: 7.6 fL (ref 0–99.8)
POC GRANULOCYTE: 3.4 (ref 2–6.9)
POC LYMPH PERCENT: 46.4 %L (ref 10–50)
POC MID %: 2.9 % (ref 0–12)
Platelet Count, POC: 328 10*3/uL (ref 142–424)
RBC: 4.52 M/uL (ref 4.04–5.48)
RDW, POC: 12.4 %
WBC: 6.7 10*3/uL (ref 4.6–10.2)

## 2017-01-05 LAB — GLUCOSE, POCT (MANUAL RESULT ENTRY): POC GLUCOSE: 309 mg/dL — AB (ref 70–99)

## 2017-01-05 MED ORDER — CARVEDILOL 12.5 MG PO TABS: 12.5000 mg | ORAL_TABLET | Freq: Two times a day (BID) | ORAL | 1 refills | Status: DC

## 2017-01-05 MED ORDER — AMLODIPINE BESYLATE 5 MG PO TABS: 5.0000 mg | ORAL_TABLET | Freq: Every day | ORAL | 1 refills | Status: DC

## 2017-01-05 MED ORDER — GLIPIZIDE ER 5 MG PO TB24: 5.0000 mg | ORAL_TABLET | Freq: Every day | ORAL | 1 refills | Status: DC

## 2017-01-05 MED ORDER — CEFTRIAXONE SODIUM 1 G IJ SOLR
1.0000 g | Freq: Once | INTRAMUSCULAR | Status: AC
  Administered 2017-01-05: 1 g via INTRAMUSCULAR

## 2017-01-05 NOTE — Progress Notes (Addendum)
Subjective:  By signing my name below, I, [name], attest that this documentation has been prepared under the direction and in the presence of [name], MD Electronically Signed: [name], ED Scribe 01/05/2017 at 8:48 AM.   Patient ID: Crystal Bryant, female    DOB: 1970-07-30, 46 y.o.   MRN: [id number]  Chief Complaint  Patient presents with  . Diabetes    3 week follow-up   . Hypertension  . Medication Management  . Vaginal Bleeding    pt states since her last OV she has been having spotting.    HPI Crystal Bryant is a 46 y.o. female who presents to Primary Care at [facility] for 3 week DM f/u. New to establish 3 wks ago.     DM: at visit 3 wks prior, A1C greater than 14. Microalbumin was borderline neg. Was on Metformin 500 bid so pt was started max dose Janumet. Pt has been checking her fasting morning blood glucose with averages of 167-180s since starting Janumet, but she still has a few days in the 300s. Denies hypoglycemic lows. She is taking the second dose of Janumet around 10/11 PM.   HLD: She has not started Lipitor yet; states she can't seem to make it to the pharmacy on time.  HTN: at visit 3 wks prior,  BP was quite uncontrolled despite taking meds that morning. Was on metoprolol 25 bid, Metoprolol changed to Coreg and started max dose Losartan-HCTZ 100-25 which she was started on last year by her thoracic surgeon.  Pt has been checking her BP outside of the office with average reading of 180/90s. Pt has not taken amlodipine in the past.   Vaginal Bleeding Pt reports malodor has resolved but now she has noticed pink-colored spotting in her underwear, in the toilet and with wiping that started a few days after her last OV on 11/17. She also reports bilateral, intermittent flank pain that she describes as a sharp sensation. Denies dysuria, urinary frequency, fever, chills, flushed sensation, diaphoresis, nausea, vomiting. H/o hysterectomy ~10 yrs ago.  H/o  Lung Cancer She has a history of NSCLC lung cancer with right upper lobectomy done in Sept. 2017 She is due for a CT. She was followed by Dr. [name] at [facility]. She notes having shortness of breath recently, difficult to catch a breath; believes it could be caused by change in weather.   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 . .   Past Medical History:  Diagnosis Date  . Anxiety   . Cancer ([place])    lung  . Diabetes mellitus without complication ([place])   . Hypertension    Current Outpatient Medications on File Prior to Visit  Medication Sig Dispense Refill  . atorvastatin (LIPITOR) 40 MG tablet Take 1 tablet (40 mg total) by mouth daily. 90 tablet 0  . carvedilol (COREG) 6.25 MG tablet Take 1 tablet (6.25 mg total) 2 (two) times daily with a meal by mouth. 60 tablet 1  . olmesartan-hydrochlorothiazide (BENICAR HCT) 40-25 MG tablet Take 1 tablet daily by mouth. 30 tablet 1  . sitaGLIPtin-metformin (JANUMET) 50-1000 MG tablet Take 1 tablet 2 (two) times daily with a meal by mouth. 60 tablet 3   No current facility-administered medications on file prior to visit.    No Known Allergies   Past Surgical History:  Procedure Laterality Date  . ABDOMINAL HYSTERECTOMY    . BREAST SURGERY    . CESAREAN SECTION    . LOBECTOMY  Family History  Problem Relation Age of Onset  . Diabetes Mother   . Hypertension Mother   . Stroke Father    Social History   Socioeconomic History  . Marital status: Divorced    Spouse name: None  . Number of children: None  . Years of education: None  . Highest education level: None  Social Needs  . Financial resource strain: None  . Food insecurity - worry: None  . Food insecurity - inability: None  . Transportation needs - medical: None  . Transportation needs - non-medical: None  Occupational History  . None  Tobacco Use  . Smoking status: Never Smoker  . Smokeless tobacco: Never Used  Substance and Sexual Activity  . Alcohol  use: No  . Drug use: No  . Sexual activity: Yes    Birth control/protection: None  Other Topics Concern  . None  Social History Narrative  . None   Depression screen [facility] 2/9 12/15/2016  Decreased Interest 0  Down, Depressed, Hopeless 0  PHQ - 2 Score 0    Review of Systems  Constitutional: Negative for chills, diaphoresis and fever.  Gastrointestinal: Negative for nausea and vomiting.  Genitourinary: Positive for flank pain and vaginal bleeding (spotting). Negative for dysuria and frequency.      Objective:   Physical Exam  Constitutional: She is oriented to person, place, and time. She appears well-developed and well-nourished. No distress.  HENT:  Head: Normocephalic and atraumatic.  Eyes: Conjunctivae and EOM are normal.  Neck: Neck supple. No tracheal deviation present.  Cardiovascular: Tachycardia present.  Question of diastolic murmur  Pulmonary/Chest: Effort normal and breath sounds normal. No respiratory distress.  Abdominal: There is generalized tenderness. There is CVA tenderness (mild, bilaterally).  Generalized abdominal tenderness but worse suprapubic and RUQ and bilateral lower quadrants  Genitourinary:  Genitourinary Comments: Chaperone present. Normal urethra. Normal vaginal mucosa. Significant tenderness to palpation over suprapubic and bladder region.  Musculoskeletal: Normal range of motion.  Neurological: She is alert and oriented to person, place, and time.  Skin: Skin is warm and dry.  Psychiatric: She has a normal mood and affect. Her behavior is normal.  Nursing note and vitals reviewed.  BP (!) 160/90   Pulse (!) 103   Temp 98.5 F (36.9 C) (Oral)   Resp 16   Ht 5' 4.96" (1.65 m)   Wt 150 lb (68 kg)   SpO2 98%   BMI 24.99 kg/m     Diabetic Foot Exam - Simple   Simple Foot Form Visual Inspection No deformities, no ulcerations, no other skin breakdown bilaterally:  Yes Sensation Testing Intact to touch and monofilament testing  bilaterally:  Yes Pulse Check Posterior Tibialis and Dorsalis pulse intact bilaterally:  Yes Comments     Results for orders placed or performed in visit on 01/05/17  POCT glucose (manual entry)  Result Value Ref Range   POC Glucose 309 (A) 70 - 99 mg/dl  POCT urinalysis dipstick  Result Value Ref Range   Color, UA yellow yellow   Clarity, UA cloudy (A) clear   Glucose, UA =500 (A) negative mg/dL   Bilirubin, UA negative negative   Ketones, POC UA negative negative mg/dL   Spec Grav, UA 1.020 1.010 - 1.025   Blood, UA trace-lysed (A) negative   pH, UA 5.5 5.0 - 8.0   Protein Ur, POC negative negative mg/dL   Urobilinogen, UA 0.2 0.2 or 1.0 E.U./dL   Nitrite, UA Negative Negative   Leukocytes, UA  Trace (A) Negative  POCT Microscopic Urinalysis ([facility])  Result Value Ref Range   WBC,UR,HPF,POC Few (A) None WBC/hpf   RBC,UR,HPF,POC None None RBC/hpf   Bacteria Few (A) None, Too numerous to count   Mucus Absent Absent   Epithelial Cells, UR Per Microscopy Few (A) None, Too numerous to count cells/hpf  POCT CBC  Result Value Ref Range   WBC 6.7 4.6 - 10.2 K/uL   Lymph, poc 3.1 0.6 - 3.4   POC LYMPH PERCENT 46.4 10 - 50 %L   MID (cbc) 0.2 0 - 0.9   POC MID % 2.9 0 - 12 %M   POC Granulocyte 3.4 2 - 6.9   Granulocyte percent 50.7 37 - 80 %G   RBC 4.52 4.04 - 5.48 M/uL   Hemoglobin 12.9 12.2 - 16.2 g/dL   HCT, POC 37.8 37.7 - 47.9 %   MCV 83.7 80 - 97 fL   MCH, POC 28.5 27 - 31.2 pg   MCHC 34.1 31.8 - 35.4 g/dL   RDW, POC 12.4 %   Platelet Count, POC 328 142 - 424 K/uL   MPV 7.6 0 - 99.8 fL   Assessment & Plan:   1. Type 2 diabetes mellitus with hyperglycemia, without long-term current use of insulin ([facility]) - maxed out on Janumet 50-1000 bid, start glipizde.  2. Medication monitoring encounter   3. Essential hypertension - cont olmesartan-hctz 40-25, double carvedilol 6.25 bid -> now 12.5 bid. Start amlodipine 5  4. Hyperlipidemia LDL goal <100 - start lipitor as discussed  last visit  5. Gross hematuria - UCLx P - due to comorbidity of hyperglycemia, cover with 1g Rocephin IM x 1 then wait for UClx before deciding about further treatment.  6. Urinary tract infection with hematuria, site unspecified - UClx neg so no abx - recheck UA at next OV    Orders Placed This Encounter  Procedures  . Urine Culture  . Fructosamine  . Comprehensive metabolic panel  . Fructosamine  . POCT glucose (manual entry)  . POCT urinalysis dipstick  . POCT Microscopic Urinalysis ([facility])  . POCT CBC  . HM DIABETES FOOT EXAM    Meds ordered this encounter  Medications  . carvedilol (COREG) 12.5 MG tablet    Sig: Take 1 tablet (12.5 mg total) by mouth 2 (two) times daily with a meal.    Dispense:  60 tablet    Refill:  1  . amLODipine (NORVASC) 5 MG tablet    Sig: Take 1 tablet (5 mg total) by mouth daily.    Dispense:  30 tablet    Refill:  1  . cefTRIAXone (ROCEPHIN) injection 1 g  . glipiZIDE (GLUCOTROL XL) 5 MG 24 hr tablet    Sig: Take 1 tablet (5 mg total) by mouth daily with breakfast.    Dispense:  30 tablet    Refill:  1    I personally performed the services described in this documentation, which was scribed in my presence. The recorded information has been reviewed and considered, and addended by me as needed.   [name], M.D.  Primary Care at [facility] [address] [place], [place] [address] [phone] phone [phone] fax  01/08/17 8:21 AM

## 2017-01-05 NOTE — Patient Instructions (Addendum)
I want to make sure the urinary tract infection completely clears and then I recommend we start you on Farxiga (or Invokana or Jardiance - whichever is on your insurance formulary.) In the meantime, lets add in the Glipizide ER 5mg  every morning in addition to the Janumet twice a day. Double up on your carvedilol twice a day (and then new higher dose at the pharmacy). Start amlodipine 5mg . Start atorvastatin. Recheck in 3-4 weeks.    IF you received an x-ray today, you will receive an invoice from [facility]. Please contact [facility] at [phone] with questions or concerns regarding your invoice.   IF you received labwork today, you will receive an invoice from [facility]. Please contact [facility] at [phone] with questions or concerns regarding your invoice.   Our billing staff will not be able to assist you with questions regarding bills from these companies.  You will be contacted with the lab results as soon as they are available. The fastest way to get your results is to activate your My Chart account. Instructions are located on the last page of this paperwork. If you have not heard from [place] regarding the results in 2 weeks, please contact this office.      Hyperglycemia Hyperglycemia occurs when the level of sugar (glucose) in the blood is too high. Glucose is a type of sugar that provides the body's main source of energy. Certain hormones (insulin and glucagon) control the level of glucose in the blood. Insulin lowers blood glucose, and glucagon increases blood glucose. Hyperglycemia can result from having too little insulin in the bloodstream, or from the body not responding normally to insulin. Hyperglycemia occurs most often in people who have diabetes (diabetes mellitus), but it can happen in people who do not have diabetes. It can develop quickly, and it can be life-threatening if it causes you to become severely dehydrated (diabetic ketoacidosis or  hyperglycemic hyperosmolar state). Severe hyperglycemia is a medical emergency. What are the causes? If you have diabetes, hyperglycemia may be caused by:  Diabetes medicine.  Medicines that increase blood glucose or affect your diabetes control.  Not eating enough, or not eating often enough.  Changes in physical activity level.  Being sick or having an infection.  If you have prediabetes or undiagnosed diabetes:  Hyperglycemia may be caused by those conditions.  If you do not have diabetes, hyperglycemia may be caused by:  Certain medicines, including steroid medicines, beta-blockers, epinephrine, and thiazide diuretics.  Stress.  Serious illness.  Surgery.  Diseases of the pancreas.  Infection.  What increases the risk? Hyperglycemia is more likely to develop in people who have risk factors for diabetes, such as:  Having a family member with diabetes.  Having a gene for type 1 diabetes that is passed from parent to child (inherited).  Living in an area with cold weather conditions.  Exposure to certain viruses.  Certain conditions in which the body's disease-fighting (immune) system attacks itself (autoimmune disorders).  Being overweight or obese.  Having an inactive (sedentary) lifestyle.  Having been diagnosed with insulin resistance.  Having a history of prediabetes, gestational diabetes, or polycystic ovarian syndrome (PCOS).  Being of American-Indian, African-American, Hispanic/Latino, or Asian/Pacific Islander descent.  What are the signs or symptoms? Hyperglycemia may not cause any symptoms. If you do have symptoms, they may include early warning signs, such as:  Increased thirst.  Hunger.  Feeling very tired.  Needing to urinate more often than usual.  Blurry vision.  Other symptoms may  develop if hyperglycemia gets worse, such as:  Dry mouth.  Loss of appetite.  Fruity-smelling breath.  Weakness.  Unexpected or rapid weight  gain or weight loss.  Tingling or numbness in the hands or feet.  Headache.  Skin that does not quickly return to normal after being lightly pinched and released (poor skin turgor).  Abdominal pain.  Cuts or bruises that are slow to heal.  How is this diagnosed? Hyperglycemia is diagnosed with a blood test to measure your blood glucose level. This blood test is usually done while you are having symptoms. Your health care provider may also do a physical exam and review your medical history. You may have more tests to determine the cause of your hyperglycemia, such as:  A fasting blood glucose (FBG) test. You will not be allowed to eat (you will fast) for at least 8 hours before a blood sample is taken.  An A1c (hemoglobin A1c) blood test. This provides information about blood glucose control over the previous 2-3 months.  An oral glucose tolerance test (OGTT). This measures your blood glucose at two times: ? After fasting. This is your baseline blood glucose level. ? Two hours after drinking a beverage that contains glucose.  How is this treated? Treatment depends on the cause of your hyperglycemia. Treatment may include:  Taking medicine to regulate your blood glucose levels. If you take insulin or other diabetes medicines, your medicine or dosage may be adjusted.  Lifestyle changes, such as exercising more, eating healthier foods, or losing weight.  Treating an illness or infection, if this caused your hyperglycemia.  Checking your blood glucose more often.  Stopping or reducing steroid medicines, if these caused your hyperglycemia.  If your hyperglycemia becomes severe and it results in hyperglycemic hyperosmolar state, you must be hospitalized and given IV fluids. Follow these instructions at home: General instructions  Take over-the-counter and prescription medicines only as told by your health care provider.  Do not use any products that contain nicotine or tobacco,  such as cigarettes and e-cigarettes. If you need help quitting, ask your health care provider.  Limit alcohol intake to no more than 1 drink per day for nonpregnant women and 2 drinks per day for men. One drink equals 12 oz of beer, 5 oz of wine, or 1 oz of hard liquor.  Learn to manage stress. If you need help with this, ask your health care provider.  Keep all follow-up visits as told by your health care provider. This is important. Eating and drinking  Maintain a healthy weight.  Exercise regularly, as directed by your health care provider.  Stay hydrated, especially when you exercise, get sick, or spend time in hot temperatures.  Eat healthy foods, such as: ? Lean proteins. ? Complex carbohydrates. ? Fresh fruits and vegetables. ? Low-fat dairy products. ? Healthy fats.  Drink enough fluid to keep your urine clear or pale yellow. If you have diabetes:   Make sure you know the symptoms of hyperglycemia.  Follow your diabetes management plan, as told by your health care provider. Make sure you: ? Take your insulin and medicines as directed. ? Follow your exercise plan. ? Follow your meal plan. Eat on time, and do not skip meals. ? Check your blood glucose as often as directed. Make sure to check your blood glucose before and after exercise. If you exercise longer or in a different way than usual, check your blood glucose more often. ? Follow your sick day plan whenever  you cannot eat or drink normally. Make this plan in advance with your health care provider.  Share your diabetes management plan with people in your workplace, school, and household.  Check your urine for ketones when you are ill and as told by your health care provider.  Carry a medical alert card or wear medical alert jewelry. Contact a health care provider if:  Your blood glucose is at or above 240 mg/dL (13.3 mmol/L) for 2 days in a row.  You have problems keeping your blood glucose in your target  range.  You have frequent episodes of hyperglycemia. Get help right away if:  You have difficulty breathing.  You have a change in how you think, feel, or act (mental status).  You have nausea or vomiting that does not go away. These symptoms may represent a serious problem that is an emergency. Do not wait to see if the symptoms will go away. Get medical help right away. Call your local emergency services (911 in the [place] Do not drive yourself to the hospital. Summary  Hyperglycemia occurs when the level of sugar (glucose) in the blood is too high.  Hyperglycemia is diagnosed with a blood test to measure your blood glucose level. This blood test is usually done while you are having symptoms. Your health care provider may also do a physical exam and review your medical history.  If you have diabetes, follow your diabetes management plan as told by your health care provider.  Contact your health care provider if you have problems keeping your blood glucose in your target range. This information is not intended to replace advice given to you by your health care provider. Make sure you discuss any questions you have with your health care provider. Document Released: 07/11/2000 Document Revised: 10/03/2015 Document Reviewed: 10/03/2015 Elsevier Interactive Patient Education  [employer].

## 2017-01-06 LAB — COMPREHENSIVE METABOLIC PANEL
ALK PHOS: 136 IU/L — AB (ref 39–117)
ALT: 15 IU/L (ref 0–32)
AST: 13 IU/L (ref 0–40)
Albumin/Globulin Ratio: 1.3 (ref 1.2–2.2)
Albumin: 4 g/dL (ref 3.5–5.5)
BUN/Creatinine Ratio: 11 (ref 9–23)
BUN: 8 mg/dL (ref 6–24)
CHLORIDE: 100 mmol/L (ref 96–106)
CO2: 22 mmol/L (ref 20–29)
Calcium: 9.1 mg/dL (ref 8.7–10.2)
Creatinine, Ser: 0.74 mg/dL (ref 0.57–1.00)
GFR calc Af Amer: 114 mL/min/{1.73_m2} (ref >59)
GFR calc non Af Amer: 99 mL/min/{1.73_m2} (ref >59)
GLUCOSE: 319 mg/dL — AB (ref 65–99)
Globulin, Total: 3.1 g/dL (ref 1.5–4.5)
Potassium: 4.3 mmol/L (ref 3.5–5.2)
Sodium: 136 mmol/L (ref 134–144)
Total Protein: 7.1 g/dL (ref 6.0–8.5)

## 2017-01-06 LAB — URINE CULTURE: Organism ID, Bacteria: NO GROWTH

## 2017-01-06 LAB — FRUCTOSAMINE: FRUCTOSAMINE: 504 umol/L — AB (ref 0–285)

## 2017-02-28 ENCOUNTER — Telehealth: Payer: Self-pay | Admitting: Family Medicine

## 2017-02-28 NOTE — Telephone Encounter (Signed)
Copied from [place] [id number]. Topic: Quick Communication - See Telephone Encounter >> Feb 28, 2017 12:31 PM Ivar Drape wrote: CRM for notification. See Telephone encounter for:  02/28/17. Patient would like to know if Dr. Brigitte Pulse would put in an order for a CT Scan of the Chest with no contrast.  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  It is a follow up from a right Lobectomy.

## 2017-03-01 NOTE — Telephone Encounter (Signed)
Please Advise

## 2017-03-02 ENCOUNTER — Encounter: Payer: Self-pay | Admitting: Family Medicine

## 2017-03-02 DIAGNOSIS — Z902 Acquired absence of lung [part of]: Secondary | ICD-10-CM | POA: Insufficient documentation

## 2017-03-02 DIAGNOSIS — Z85118 Personal history of other malignant neoplasm of bronchus and lung: Secondary | ICD-10-CM | POA: Insufficient documentation

## 2017-03-02 MED ORDER — OLMESARTAN MEDOXOMIL-HCTZ 40-25 MG PO TABS: 1.0000 | ORAL_TABLET | Freq: Every day | ORAL | 0 refills | Status: DC

## 2017-03-02 NOTE — Addendum Note (Signed)
Addended by: Shawnee Knapp on: 03/02/2017 02:26 PM   Modules accepted: Orders

## 2017-03-02 NOTE — Telephone Encounter (Addendum)
No problem. Order placed on her last visit 12/8 and sent to [facility] as I think that is where she worked so thought it would be easiest for her but please see if she prefers it elsewhere.  Can let pt know order is in and will be faster if she calls them to sched. Thanks.

## 2017-03-02 NOTE — Addendum Note (Signed)
Addended by: Shawnee Knapp on: 03/02/2017 03:58 PM   Modules accepted: Orders

## 2017-03-04 NOTE — Telephone Encounter (Signed)
Order sent to [facility] by Peter Congo 2/4. I left pt a vm letting her know this and to call them at [phone] to schedule, or let us know if she would like to switch imaging facilities.

## 2017-03-05 ENCOUNTER — Telehealth: Payer: Self-pay

## 2017-03-05 NOTE — Telephone Encounter (Signed)
[facility] faxed request for alternate medication, due to Benicar HCT being on back order. Please advise

## 2017-03-07 ENCOUNTER — Ambulatory Visit (HOSPITAL_COMMUNITY)
Admission: RE | Admit: 2017-03-07 | Discharge: 2017-03-07 | Disposition: A | Discharge status: Home / Self Care | Payer: 59 | Source: Ambulatory Visit | Attending: Family Medicine | Admitting: Family Medicine

## 2017-03-07 DIAGNOSIS — C3411 Malignant neoplasm of upper lobe, right bronchus or lung: Secondary | ICD-10-CM | POA: Insufficient documentation

## 2017-03-07 DIAGNOSIS — Z902 Acquired absence of lung [part of]: Secondary | ICD-10-CM | POA: Insufficient documentation

## 2017-03-07 DIAGNOSIS — I700 Atherosclerosis of aorta: Secondary | ICD-10-CM | POA: Insufficient documentation

## 2017-03-07 DIAGNOSIS — C3490 Malignant neoplasm of unspecified part of unspecified bronchus or lung: Secondary | ICD-10-CM | POA: Diagnosis not present

## 2017-03-07 DIAGNOSIS — Z85118 Personal history of other malignant neoplasm of bronchus and lung: Secondary | ICD-10-CM

## 2017-03-07 MED ORDER — CANDESARTAN CILEXETIL-HCTZ 32-25 MG PO TABS: 1.0000 | ORAL_TABLET | Freq: Every day | ORAL | 0 refills | Status: DC

## 2017-03-07 NOTE — Addendum Note (Signed)
Addended by: Shawnee Knapp on: 03/07/2017 06:46 PM   Modules accepted: Orders

## 2017-03-07 NOTE — Telephone Encounter (Signed)
Candesartan-hctz (atacand) sent in to replace olmesartan-hctz (benicar) while it is on back order at equivalent dose.

## 2017-03-25 ENCOUNTER — Encounter: Payer: Self-pay | Admitting: Family Medicine

## 2017-04-20 ENCOUNTER — Telehealth: Payer: Self-pay | Admitting: Family Medicine

## 2017-04-20 DIAGNOSIS — I700 Atherosclerosis of aorta: Secondary | ICD-10-CM

## 2017-04-20 NOTE — Telephone Encounter (Signed)
Copied from [place] [phone]. Topic: Referral - Request >> Apr 19, 2017 12:30 PM Ether Griffins B wrote: Reason for CRM: pt would like a referral to cardiologist. Due to her CT results.  Pt requesting [facility]   -----------------------------------------------------------------------------------------  Please advise. Thanks!

## 2017-04-21 NOTE — Telephone Encounter (Signed)
[facility] doesn't have their own cardiology group anymore - they joined several other groups to be [employer] so referred there.

## 2017-04-23 ENCOUNTER — Encounter: Payer: Self-pay | Admitting: Cardiovascular Disease

## 2017-05-03 ENCOUNTER — Encounter: Payer: Self-pay | Admitting: Cardiovascular Disease

## 2017-05-03 ENCOUNTER — Ambulatory Visit (INDEPENDENT_AMBULATORY_CARE_PROVIDER_SITE_OTHER): Payer: 59 | Admitting: Cardiovascular Disease

## 2017-05-03 VITALS — BP 164/114 | HR 111 | Ht 65.0 in | Wt 148.8 lb

## 2017-05-03 DIAGNOSIS — R0602 Shortness of breath: Secondary | ICD-10-CM | POA: Diagnosis not present

## 2017-05-03 DIAGNOSIS — I447 Left bundle-branch block, unspecified: Secondary | ICD-10-CM

## 2017-05-03 MED ORDER — CARVEDILOL 25 MG PO TABS: 25.0000 mg | ORAL_TABLET | Freq: Two times a day (BID) | ORAL | 3 refills | Status: DC

## 2017-05-03 MED ORDER — AMLODIPINE BESYLATE 5 MG PO TABS: 5.0000 mg | ORAL_TABLET | Freq: Every day | ORAL | 11 refills | Status: DC

## 2017-05-03 NOTE — Patient Instructions (Addendum)
Medication Instructions:  Your physician has recommended you make the following change in your medication:  INCREASE Carvedilol (Coreg) to 25 mg twice daily START Amlodipine (Norvasc) 5 mg once daily   Labwork: None Ordered   Testing/Procedures: Your physician has requested that you have an echocardiogram. Echocardiography is a painless test that uses sound waves to create images of your heart. It provides your doctor with information about the size and shape of your heart and how well your heart's chambers and valves are working. This procedure takes approximately one hour. There are no restrictions for this procedure.   Follow-Up: Your physician recommends that you schedule a follow-up appointment in: 2 weeks on Tuesday April 16 at 12:00    If you need a refill on your cardiac medications before your next appointment, please call your pharmacy.   Thank you for choosing [facility]! Christen Bame, RN [phone]

## 2017-05-03 NOTE — Progress Notes (Signed)
Cardiology Office Note:    Date:  05/03/2017   ID:  Crystal Bryant, DOB 03-17-1970, MRN [id number]  PCP:  Crystal Knapp, MD  Cardiologist:  Crystal Moores, MD   Referring MD: Crystal Knapp, MD   Chief Complaint  Patient presents with  . Hypertension      Initial Visit   May 03, 2017    Crystal Bryant is a 47 y.o. female with a hx of non small cell lung cancer, HTN, LBBB   Hx of lung cancer last year in 2018 , status post resection.  She did not require chemotherapy. She has a history of high blood pressure.    Had HELLP syndrome with birth of son  .  She required dialysis for a short time following the birth of her son.  For the past 24 years since the birth of her son.  Hx of DM     Generally she has felt well,  She has had shortness of breath doing her normal activities.    intermittant shortness of breath.   Episodes may last for 10-15 minutes.  No chest tightness or pain   Exercises - walks the treadmill on occasion but does not push the pace  + PND and orthopnea since her lung surgery   Tries to limit her salt intake .  Not much fast food or take out .   Had an echo at [facility] ~ 2017 ( results not found )   Non smoker  No ETOH Family HTN - on both sides of family   Past Medical History:  Diagnosis Date  . Anxiety   . Diabetes mellitus without complication ([place])   . Hypertension   . Lung cancer, upper lobe ([place]) 09/2015   Non-small cell in right upper lobe treated with RULectomy by VATS at [facility]    Past Surgical History:  Procedure Laterality Date  . ABDOMINAL HYSTERECTOMY    . BREAST SURGERY    . CESAREAN SECTION    . LOBECTOMY Right 10/06/2015   [place] lung cancer treated with VATS right upper lobectomy by Dr. Maryjane Bryant at [facility].    Current Medications: Current Meds  Medication Sig  . Candesartan Cilexetil-HCTZ 32-25 MG TABS Take 1 tablet by mouth daily.  . sitaGLIPtin-metformin (JANUMET) 50-1000 MG tablet Take 1 tablet 2 (two) times daily with a  meal by mouth.  . [DISCONTINUED] carvedilol (COREG) 12.5 MG tablet Take 1 tablet (12.5 mg total) by mouth 2 (two) times daily with a meal.     Allergies:   Patient has no known allergies.   Social History   Socioeconomic History  . Marital status: Divorced    Spouse name: Not on file  . Number of children: Not on file  . Years of education: Not on file  . Highest education level: Not on file  Occupational History  . Not on file  Social Needs  . Financial resource strain: Not on file  . Food insecurity:    Worry: Not on file    Inability: Not on file  . Transportation needs:    Medical: Not on file    Non-medical: Not on file  Tobacco Use  . Smoking status: Never Smoker  . Smokeless tobacco: Never Used  Substance and Sexual Activity  . Alcohol use: No  . Drug use: No  . Sexual activity: Yes    Birth control/protection: None  Lifestyle  . Physical activity:    Days per week: Not on file  Minutes per session: Not on file  . Stress: Not on file  Relationships  . Social connections:    Talks on phone: Not on file    Gets together: Not on file    Attends religious service: Not on file    Active member of club or organization: Not on file    Attends meetings of clubs or organizations: Not on file    Relationship status: Not on file  Other Topics Concern  . Not on file  Social History Narrative  . Not on file     Family History: The patient's family history includes Diabetes in her mother; Hypertension in her mother; Stroke in her father.  ROS:   Please see the history of present illness.     All other systems reviewed and are negative.  EKGs/Labs/Other Studies Reviewed:    The following studies were reviewed today:   EKG:   May 03, 2017: Sinus tachycardia at 110 beats a minute.  Left bundle branch block.  Recent Labs: 01/05/2017: ALT 15; BUN 8; Creatinine, Ser 0.74; Hemoglobin 12.9; Potassium 4.3; Sodium 136  Recent Lipid Panel    Component Value  Date/Time   CHOL 278 (H) 12/15/2016 1201   TRIG 182 (H) 12/15/2016 1201   HDL 45 12/15/2016 1201   CHOLHDL 6.2 (H) 12/15/2016 1201   LDLCALC 197 (H) 12/15/2016 1201    Physical Exam:    VS:  BP (!) 164/114   Pulse (!) 111   Ht 5\' 5"  (1.651 m)   Wt 148 lb 12.8 oz (67.5 kg)   BMI 24.76 kg/m     Wt Readings from Last 3 Encounters:  05/03/17 148 lb 12.8 oz (67.5 kg)  01/05/17 150 lb (68 kg)  12/15/16 145 lb 12.8 oz (66.1 kg)     GEN: Middle-aged female, no acute distress.  Slightly anxious. HEENT: Normal NECK: No JVD; No carotid bruits LYMPHATICS: No lymphadenopathy CARDIAC: Regular rate S4, S1-S2 tachycardic.  She has a soft systolic murmur. RESPIRATORY:  Clear to auscultation without rales, wheezing or rhonchi  ABDOMEN: Soft, non-tender, non-distended MUSCULOSKELETAL:  No edema; No deformity  SKIN: Warm and dry NEUROLOGIC:  Alert and oriented x 3 PSYCHIATRIC:  Normal affect   ASSESSMENT:    1. Left bundle branch block (LBBB)   2. SOB (shortness of breath)    PLAN:    In order of problems listed above:  1. 1.  Shortness breath with exertion: Tiare presents with increasing shortness of breath for the past several weeks.  She has a long history of left bundle branch block, hypertension, diabetes.  Concerned that she may have developed congestive heart failure.  We will get an echocardiogram in the next several days for further evaluation.  We increase the carvedilol to 25 mg twice a day.  We will add amlodipine for better blood pressure control.  2.  Left bundle branch block: This is been stable since 2017.  She has had a stress test in the past to rule out coronary disease.  Her last echo was 2017 but we do not have the results of that.  3.  Essential hypertension: Her blood pressure remains elevated.  We will have her reduce her salt intake as much as possible.  We will increase the carvedilol to 25 mg twice a day and add amlodipine 5 mg a day.  We may need to substitute  Lasix for the HCTZ or perhaps add Aldactone.  3.  Diabetes mellitus: Her diabetes has not been well  controlled.  She is been working on that.  4.  Non-small cell lung cancer: She is status post surgical resection.  We will follow-up with the doctors at [facility].    Medication Adjustments/Labs and Tests Ordered: Current medicines are reviewed at length with the patient today.  Concerns regarding medicines are outlined above.  Orders Placed This Encounter  Procedures  . EKG 12-Lead  . ECHOCARDIOGRAM COMPLETE   Meds ordered this encounter  Medications  . carvedilol (COREG) 25 MG tablet    Sig: Take 1 tablet (25 mg total) by mouth 2 (two) times daily.    Dispense:  180 tablet    Refill:  3  . amLODipine (NORVASC) 5 MG tablet    Sig: Take 1 tablet (5 mg total) by mouth daily.    Dispense:  30 tablet    Refill:  11    Signed, Crystal Moores, MD  05/03/2017 5:11 PM    [place]

## 2017-05-06 ENCOUNTER — Ambulatory Visit (HOSPITAL_COMMUNITY): Payer: 59 | Attending: Cardiology

## 2017-05-06 ENCOUNTER — Telehealth: Payer: Self-pay | Admitting: Nurse Practitioner

## 2017-05-06 ENCOUNTER — Other Ambulatory Visit: Payer: Self-pay

## 2017-05-06 DIAGNOSIS — Z85118 Personal history of other malignant neoplasm of bronchus and lung: Secondary | ICD-10-CM | POA: Insufficient documentation

## 2017-05-06 DIAGNOSIS — Z833 Family history of diabetes mellitus: Secondary | ICD-10-CM | POA: Insufficient documentation

## 2017-05-06 DIAGNOSIS — I447 Left bundle-branch block, unspecified: Secondary | ICD-10-CM | POA: Insufficient documentation

## 2017-05-06 DIAGNOSIS — I5189 Other ill-defined heart diseases: Secondary | ICD-10-CM | POA: Diagnosis not present

## 2017-05-06 DIAGNOSIS — Z8249 Family history of ischemic heart disease and other diseases of the circulatory system: Secondary | ICD-10-CM | POA: Diagnosis not present

## 2017-05-06 DIAGNOSIS — R0602 Shortness of breath: Secondary | ICD-10-CM | POA: Insufficient documentation

## 2017-05-06 MED ORDER — FUROSEMIDE 40 MG PO TABS: 40.0000 mg | ORAL_TABLET | Freq: Every day | ORAL | 3 refills | Status: DC

## 2017-05-06 MED ORDER — SACUBITRIL-VALSARTAN 24-26 MG PO TABS: 1.0000 | ORAL_TABLET | Freq: Two times a day (BID) | ORAL | 0 refills | Status: DC

## 2017-05-06 MED ORDER — POTASSIUM CHLORIDE CRYS ER 20 MEQ PO TBCR: 20.0000 meq | EXTENDED_RELEASE_TABLET | Freq: Every day | ORAL | 3 refills | Status: DC

## 2017-05-06 MED FILL — POTASSIUM CL ER 20 MEQ TABL: 20 | 90 days supply | Qty: 90 | Fill #0

## 2017-05-06 MED FILL — FUROSEMIDE 40 MG TAB: 40 | 90 days supply | Qty: 90 | Fill #0

## 2017-05-06 NOTE — Telephone Encounter (Signed)
-----   Message from Thayer Headings, MD sent at 05/06/2017  1:42 PM EDT ----- EF is moderately reduced.   I think its actually more like 35-40% range She was hypertensive at her office visit We need to continue with aggressive BP lowering  DC candisartan - HCTZ Start Entresto 24-26 mg BID, Lasix 40 mg a day  As we titrate up the Entresto, we will taper the amlodipine down. Needs a work in  office visit next week

## 2017-05-06 NOTE — Telephone Encounter (Signed)
Reviewed results and plan of care with patient who verbalized understanding and agreement. I sent a message to Dr. Acie Fredrickson regarding potassium supplement and he advised patient take 20 mEq once daily while she is taking the Lasix. She has an appointment with Dr. Acie Fredrickson on 4/16. I advised her to contact me with questions or concerns prior to follow-up and she thanked me for my help.

## 2017-05-13 DIAGNOSIS — N898 Other specified noninflammatory disorders of vagina: Secondary | ICD-10-CM | POA: Diagnosis not present

## 2017-05-13 DIAGNOSIS — E119 Type 2 diabetes mellitus without complications: Secondary | ICD-10-CM | POA: Insufficient documentation

## 2017-05-13 DIAGNOSIS — I10 Essential (primary) hypertension: Secondary | ICD-10-CM | POA: Insufficient documentation

## 2017-05-13 DIAGNOSIS — Z01419 Encounter for gynecological examination (general) (routine) without abnormal findings: Secondary | ICD-10-CM | POA: Diagnosis not present

## 2017-05-13 MED FILL — metroNIDAZOLE 500 MG TABS: 500 | 2 days supply | Qty: 8 | Fill #0

## 2017-05-14 ENCOUNTER — Ambulatory Visit (INDEPENDENT_AMBULATORY_CARE_PROVIDER_SITE_OTHER): Payer: 59 | Admitting: Cardiovascular Disease

## 2017-05-14 ENCOUNTER — Encounter: Payer: Self-pay | Admitting: Cardiovascular Disease

## 2017-05-14 VITALS — BP 178/124 | HR 107 | Ht 65.0 in | Wt 150.0 lb

## 2017-05-14 DIAGNOSIS — I5022 Chronic systolic (congestive) heart failure: Secondary | ICD-10-CM

## 2017-05-14 DIAGNOSIS — I5043 Acute on chronic combined systolic (congestive) and diastolic (congestive) heart failure: Secondary | ICD-10-CM

## 2017-05-14 DIAGNOSIS — E118 Type 2 diabetes mellitus with unspecified complications: Secondary | ICD-10-CM | POA: Diagnosis not present

## 2017-05-14 MED ORDER — CARVEDILOL 25 MG PO TABS: 37.5000 mg | ORAL_TABLET | Freq: Two times a day (BID) | ORAL | 3 refills | Status: DC

## 2017-05-14 MED ORDER — SACUBITRIL-VALSARTAN 49-51 MG PO TABS: 1.0000 | ORAL_TABLET | Freq: Two times a day (BID) | ORAL | 11 refills | Status: DC

## 2017-05-14 MED FILL — ENTRESTO 49 MG-51 MG TABLET: 49-51 | 30 days supply | Qty: 60 | Fill #0

## 2017-05-14 MED FILL — CARVEDILOL 25 MG TABLET: 25 | 90 days supply | Qty: 270 | Fill #0

## 2017-05-14 NOTE — Progress Notes (Signed)
  Cardiology Office Note:    Duplicate office note See note from same day      Mertie Moores, MD  05/15/2017 8:35 AM    [address],  [place], [place]  [id number] Pager [phone] Phone: [phone]; Fax: [phone]

## 2017-05-14 NOTE — Progress Notes (Signed)
Cardiology Office Note:    Date:  05/14/2017   ID:  [name], DOB Oct 09, 1970, MRN [id number]  PCP:  Shawnee Knapp, MD  Cardiologist:  Mertie Moores, MD   Referring MD: Shawnee Knapp, MD   Chief Complaint  Patient presents with  . Congestive Heart Failure      Initial Visit   May 03, 2017    Crystal Bryant is a 47 y.o. female with a hx of non small cell lung cancer, HTN, LBBB   Hx of lung cancer last year in 2018 , status post resection.  She did not require chemotherapy. She has a history of high blood pressure.    Had HELLP syndrome with birth of son  .  She required dialysis for a short time following the birth of her son.  For the past 24 years since the birth of her son.  Hx of DM     Generally she has felt well,  She has had shortness of breath doing her normal activities.    intermittant shortness of breath.   Episodes may last for 10-15 minutes.  No chest tightness or pain   Exercises - walks the treadmill on occasion but does not push the pace  + PND and orthopnea since her lung surgery   Tries to limit her salt intake .  Not much fast food or take out .   Had an echo at [facility] ~ 2017 ( results not found )   Non smoker  No ETOH Family HTN - on both sides of family   May 14, 2017: Crystal Bryant  is seen back today for follow-up of her congestive heart failure.  I saw her 2 weeks ago for episodes of shortness of breath.  She has had marked hypertension for years.  Echocardiogram revealed moderate to severe left ventricular dysfunction with an ejection fraction of around 35%.  No reported ejection fraction on the echo is 40-45%.  Candesartan-HCTZ was discontinued.  She was started on Lasix 40 mg a day.  She was started on Entresto 24-26 mg twice a day.  Feeling a little better.   Still very short of breath when she climbs stairs.   Glucose levels - typicaly 225 in the evenings.  ,  Well controlled in the am .   Past Medical History:  Diagnosis Date    . Anxiety   . Diabetes mellitus without complication ([place])   . Hypertension   . Lung cancer, upper lobe ([place]) 09/2015   Non-small cell in right upper lobe treated with RULectomy by VATS at [facility]    Past Surgical History:  Procedure Laterality Date  . ABDOMINAL HYSTERECTOMY    . BREAST SURGERY    . CESAREAN SECTION    . LOBECTOMY Right 10/06/2015   [place] lung cancer treated with VATS right upper lobectomy by Dr. [name] at [facility].    Current Medications: Current Meds  Medication Sig  . amLODipine (NORVASC) 5 MG tablet Take 1 tablet (5 mg total) by mouth daily.  . furosemide (LASIX) 40 MG tablet Take 1 tablet (40 mg total) by mouth daily.  . metroNIDAZOLE (FLAGYL) 500 MG tablet Take 1 tablet by mouth daily.  . potassium chloride SA (K-DUR,KLOR-CON) 20 MEQ tablet Take 1 tablet (20 mEq total) by mouth daily.  . sacubitril-valsartan (ENTRESTO) 24-26 MG Take 1 tablet by mouth 2 (two) times daily.  . sitaGLIPtin-metformin (JANUMET) 50-1000 MG tablet Take 1 tablet 2 (two) times daily with a meal by mouth.  . [  DISCONTINUED] carvedilol (COREG) 25 MG tablet Take 1 tablet (25 mg total) by mouth 2 (two) times daily.     Allergies:   Patient has no known allergies.   Social History   Socioeconomic History  . Marital status: Divorced    Spouse name: Not on file  . Number of children: Not on file  . Years of education: Not on file  . Highest education level: Not on file  Occupational History  . Not on file  Social Needs  . Financial resource strain: Not on file  . Food insecurity:    Worry: Not on file    Inability: Not on file  . Transportation needs:    Medical: Not on file    Non-medical: Not on file  Tobacco Use  . Smoking status: Never Smoker  . Smokeless tobacco: Never Used  Substance and Sexual Activity  . Alcohol use: No  . Drug use: No  . Sexual activity: Yes    Birth control/protection: None  Lifestyle  . Physical activity:    Days per week: Not on file    Minutes  per session: Not on file  . Stress: Not on file  Relationships  . Social connections:    Talks on phone: Not on file    Gets together: Not on file    Attends religious service: Not on file    Active member of club or organization: Not on file    Attends meetings of clubs or organizations: Not on file    Relationship status: Not on file  Other Topics Concern  . Not on file  Social History Narrative  . Not on file     Family History: The patient's family history includes Diabetes in her mother; Hypertension in her mother; Stroke in her father.  ROS:   Please see the history of present illness.     All other systems reviewed and are negative.  EKGs/Labs/Other Studies Reviewed:    The following studies were reviewed today:   EKG:      Recent Labs: 01/05/2017: ALT 15; BUN 8; Creatinine, Ser 0.74; Hemoglobin 12.9; Potassium 4.3; Sodium 136  Recent Lipid Panel    Component Value Date/Time   CHOL 278 (H) 12/15/2016 1201   TRIG 182 (H) 12/15/2016 1201   HDL 45 12/15/2016 1201   CHOLHDL 6.2 (H) 12/15/2016 1201   LDLCALC 197 (H) 12/15/2016 1201    Physical Exam: Blood pressure (!) 178/124, pulse (!) 107, height 5\' 5"  (1.651 m), weight 150 lb (68 kg), SpO2 95 %.  GEN:  Well nourished, well developed in no acute distress HEENT: Normal NECK: No JVD; No carotid bruits LYMPHATICS: No lymphadenopathy CARDIAC: RR , tachycardia  RESPIRATORY:  Clear to auscultation without rales, wheezing or rhonchi  ABDOMEN: Soft, non-tender, non-distended MUSCULOSKELETAL:  No edema; No deformity  SKIN: Warm and dry NEUROLOGIC:  Alert and oriented x 3     Assessment / PLAN:     1.  Shortness breath with exertion: Crystal Bryant presents for follow-up of her acute on chronic systolic congestive heart failure.  She has an ejection fraction of around 35%.  She has a left bundle branch block.  We have started her on Entresto and will increase Entresto to 49 mg - 51 mg today.  She remains tachycardic.   We will increase the carvedilol to 37.5 mg twice a day.  2.  Left bundle branch block: She has a left bundle branch block and significant dyssynchrony on echo.  I suspect that  she will need CRT pacing.  3.  Essential hypertension: Blood pressure still elevated.  We will continue to titrate up her medications.  3.  Diabetes mellitus: Her diabetes has not been well controlled.   Add Jardiance 10 mg a day   4.  Non-small cell lung cancer: She is status post surgical resection.  We will follow-up with the doctors at [facility].   Medication Adjustments/Labs and Tests Ordered: Current medicines are reviewed at length with the patient today.  Concerns regarding medicines are outlined above.  Orders Placed This Encounter  Procedures  . Basic Metabolic Panel (BMET)  . Ambulatory referral to Endocrinology   Meds ordered this encounter  Medications  . carvedilol (COREG) 25 MG tablet    Sig: Take 1.5 tablets (37.5 mg total) by mouth 2 (two) times daily.    Dispense:  270 tablet    Refill:  3    Signed, Mertie Moores, MD  05/14/2017 12:23 PM    [facility]

## 2017-05-14 NOTE — Patient Instructions (Addendum)
Medication Instructions:  Your physician has recommended you make the following change in your medication: INCREASE Entresto to 49-51 mg twice daily INCREASE Carvedilol (Coreg) to 37.5 mg twice daily ( 1 1/2 pills twice daily)   Labwork: Your physician recommends that you return for lab work in: 2 weeks for basic metabolic panel   Testing/Procedures: None Ordered   Follow-Up: Your physician recommends that you schedule a follow-up appointment in: 2 weeks with Dr. Acie Fredrickson on Tuesday April 30 at 12:00  You have been referred to [facility]      If you need a refill on your cardiac medications before your next appointment, please call your pharmacy.   Thank you for choosing [facility]! Christen Bame, RN [phone]

## 2017-05-20 ENCOUNTER — Encounter: Payer: Self-pay | Admitting: Family Medicine

## 2017-05-28 ENCOUNTER — Ambulatory Visit: Payer: 59 | Admitting: Cardiovascular Disease

## 2017-05-28 ENCOUNTER — Other Ambulatory Visit: Payer: 59

## 2017-05-28 DIAGNOSIS — E118 Type 2 diabetes mellitus with unspecified complications: Secondary | ICD-10-CM | POA: Diagnosis not present

## 2017-05-28 DIAGNOSIS — I5022 Chronic systolic (congestive) heart failure: Secondary | ICD-10-CM | POA: Diagnosis not present

## 2017-05-29 LAB — BASIC METABOLIC PANEL
BUN / CREAT RATIO: 13 (ref 9–23)
BUN: 11 mg/dL (ref 6–24)
CHLORIDE: 98 mmol/L (ref 96–106)
CO2: 25 mmol/L (ref 20–29)
CREATININE: 0.87 mg/dL (ref 0.57–1.00)
Calcium: 9.8 mg/dL (ref 8.7–10.2)
GFR calc non Af Amer: 81 mL/min/{1.73_m2} (ref >59)
GFR, EST AFRICAN AMERICAN: 94 mL/min/{1.73_m2} (ref >59)
Glucose: 340 mg/dL — ABNORMAL HIGH (ref 65–99)
Potassium: 4.2 mmol/L (ref 3.5–5.2)
Sodium: 138 mmol/L (ref 134–144)

## 2017-06-04 ENCOUNTER — Encounter: Payer: Self-pay | Admitting: Urgent Care

## 2017-06-04 ENCOUNTER — Ambulatory Visit: Payer: 59 | Admitting: Urgent Care

## 2017-06-04 VITALS — BP 150/120 | HR 101 | Temp 98.2°F | Resp 16 | Ht 65.0 in | Wt 150.4 lb

## 2017-06-04 DIAGNOSIS — R21 Rash and other nonspecific skin eruption: Secondary | ICD-10-CM

## 2017-06-04 DIAGNOSIS — G4700 Insomnia, unspecified: Secondary | ICD-10-CM

## 2017-06-04 DIAGNOSIS — L568 Other specified acute skin changes due to ultraviolet radiation: Secondary | ICD-10-CM | POA: Diagnosis not present

## 2017-06-04 DIAGNOSIS — E1165 Type 2 diabetes mellitus with hyperglycemia: Secondary | ICD-10-CM

## 2017-06-04 DIAGNOSIS — F419 Anxiety disorder, unspecified: Secondary | ICD-10-CM

## 2017-06-04 DIAGNOSIS — R030 Elevated blood-pressure reading, without diagnosis of hypertension: Secondary | ICD-10-CM | POA: Diagnosis not present

## 2017-06-04 DIAGNOSIS — I10 Essential (primary) hypertension: Secondary | ICD-10-CM | POA: Diagnosis not present

## 2017-06-04 MED ORDER — SERTRALINE HCL 100 MG PO TABS: 100.0000 mg | ORAL_TABLET | Freq: Every day | ORAL | 1 refills | Status: DC

## 2017-06-04 MED ORDER — TRIAMCINOLONE ACETONIDE 0.1 % EX CREA
1.0000 | TOPICAL_CREAM | Freq: Two times a day (BID) | CUTANEOUS | 0 refills | Status: DC
Start: 2017-06-04 — End: 2018-08-04

## 2017-06-04 MED ORDER — ALPRAZOLAM 0.5 MG PO TABS: 0.5000 mg | ORAL_TABLET | Freq: Every evening | ORAL | 0 refills | Status: DC | PRN

## 2017-06-04 MED FILL — TRIAMCINOLONE 0.1% CREAM: 0.1 | 15 days supply | Qty: 30 | Fill #0

## 2017-06-04 MED FILL — SERTRALINE HCL 100 MG TAB: 100 | 90 days supply | Qty: 90 | Fill #0

## 2017-06-04 MED FILL — ALPRAZolam 0.5 MG TABS: 0.5 | 30 days supply | Qty: 30 | Fill #0

## 2017-06-04 NOTE — Patient Instructions (Addendum)
[facility] [address] [place], [place] [address]   Burnard Leigh [phone]  Horton Finer [phone]  Everardo Beals [phone]   [facility] ([address] [place]) - [phone]  [facility]) - [facility] - [phone]  [facility] [place] - [phone]  [facility]  - [phone] (do not file insurance)    For the first 3 weeks, take 1/2 tablet of Zoloft (50mg ). Then increase to 1 full tablet (100mg ) after those 3 weeks.    Sertraline tablets What is this medicine? SERTRALINE (SER tra leen) is used to treat depression. It may also be used to treat obsessive compulsive disorder, panic disorder, post-trauma stress, premenstrual dysphoric disorder (PMDD) or social anxiety. This medicine may be used for other purposes; ask your health care provider or pharmacist if you have questions. COMMON BRAND NAME(S): Zoloft What should I tell my health care provider before I take this medicine? They need to know if you have any of these conditions: -bleeding disorders -bipolar disorder or a family history of bipolar disorder -glaucoma -heart disease -high blood pressure -history of irregular heartbeat -history of low levels of calcium, magnesium, or potassium in the blood -if you often drink alcohol -liver disease -receiving electroconvulsive therapy -seizures -suicidal thoughts, plans, or attempt; a previous suicide attempt by you or a family member -take medicines that treat or prevent blood clots -thyroid disease -an unusual or allergic reaction to sertraline, other medicines, foods, dyes, or preservatives -pregnant or trying to get pregnant -breast-feeding How should I use this medicine? Take this medicine by mouth with a glass of water. Follow the  directions on the prescription label. You can take it with or without food. Take your medicine at regular intervals. Do not take your medicine more often than directed. Do not stop taking this medicine suddenly except upon the advice of your doctor. Stopping this medicine too quickly may cause serious side effects or your condition may worsen. A special MedGuide will be given to you by the pharmacist with each prescription and refill. Be sure to read this information carefully each time. Talk to your pediatrician regarding the use of this medicine in children. While this drug may be prescribed for children as young as 7 years for selected conditions, precautions do apply. Overdosage: If you think you have taken too much of this medicine contact a poison control center or emergency room at once. NOTE: This medicine is only for you. Do not share this medicine with others. What if I miss a dose? If you miss a dose, take it as soon as you can. If it is almost time for your next dose, take only that dose. Do not take double or extra doses. What may interact with this medicine? Do not take this medicine with any of the following medications: -cisapride -dofetilide -dronedarone -linezolid -MAOIs like Carbex, Eldepryl, Marplan, Nardil, and Parnate -methylene blue (injected into a vein) -pimozide -thioridazine This medicine may also interact with the following medications: -alcohol -amphetamines -aspirin and aspirin-like medicines -certain medicines for depression, anxiety, or psychotic disturbances -certain medicines for fungal infections like ketoconazole, fluconazole, posaconazole, and itraconazole -certain medicines for irregular heart beat like flecainide, quinidine, propafenone -certain medicines for migraine headaches like almotriptan, eletriptan, frovatriptan, naratriptan, rizatriptan, sumatriptan, zolmitriptan -certain medicines for sleep -certain medicines for seizures like carbamazepine,  valproic acid, phenytoin -certain medicines that treat or prevent blood clots like  warfarin, enoxaparin, dalteparin -cimetidine -digoxin -diuretics -fentanyl -isoniazid -lithium -NSAIDs, medicines for pain and inflammation, like ibuprofen or naproxen -other medicines that prolong the QT interval (cause an abnormal heart rhythm) -rasagiline -safinamide -supplements like St. John's wort, kava kava, valerian -tolbutamide -tramadol -tryptophan This list may not describe all possible interactions. Give your health care provider a list of all the medicines, herbs, non-prescription drugs, or dietary supplements you use. Also tell them if you smoke, drink alcohol, or use illegal drugs. Some items may interact with your medicine. What should I watch for while using this medicine? Tell your doctor if your symptoms do not get better or if they get worse. Visit your doctor or health care professional for regular checks on your progress. Because it may take several weeks to see the full effects of this medicine, it is important to continue your treatment as prescribed by your doctor. Patients and their families should watch out for new or worsening thoughts of suicide or depression. Also watch out for sudden changes in feelings such as feeling anxious, agitated, panicky, irritable, hostile, aggressive, impulsive, severely restless, overly excited and hyperactive, or not being able to sleep. If this happens, especially at the beginning of treatment or after a change in dose, call your health care professional. Dennis Bast may get drowsy or dizzy. Do not drive, use machinery, or do anything that needs mental alertness until you know how this medicine affects you. Do not stand or sit up quickly, especially if you are an older patient. This reduces the risk of dizzy or fainting spells. Alcohol may interfere with the effect of this medicine. Avoid alcoholic drinks. Your mouth may get dry. Chewing sugarless gum or sucking  hard candy, and drinking plenty of water may help. Contact your doctor if the problem does not go away or is severe. What side effects may I notice from receiving this medicine? Side effects that you should report to your doctor or health care professional as soon as possible: -allergic reactions like skin rash, itching or hives, swelling of the face, lips, or tongue -anxious -black, tarry stools -changes in vision -confusion -elevated mood, decreased need for sleep, racing thoughts, impulsive behavior -eye pain -fast, irregular heartbeat -feeling faint or lightheaded, falls -feeling agitated, angry, or irritable -hallucination, loss of contact with reality -loss of balance or coordination -loss of memory -painful or prolonged erections -restlessness, pacing, inability to keep still -seizures -stiff muscles -suicidal thoughts or other mood changes -trouble sleeping -unusual bleeding or bruising -unusually weak or tired -vomiting Side effects that usually do not require medical attention (report to your doctor or health care professional if they continue or are bothersome): -change in appetite or weight -change in sex drive or performance -diarrhea -increased sweating -indigestion, nausea -tremors This list may not describe all possible side effects. Call your doctor for medical advice about side effects. You may report side effects to FDA at 1-800-FDA-1088. Where should I keep my medicine? Keep out of the reach of children. Store at room temperature between 15 and 30 degrees C (59 and 86 degrees F). Throw away any unused medicine after the expiration date. NOTE: This sheet is a summary. It may not cover all possible information. If you have questions about this medicine, talk to your doctor, pharmacist, or health care provider.  2018 Elsevier/Gold Standard (2016-01-20 14:17:49)      IF you received an x-ray today, you will receive an invoice from [facility].  Please contact [facility] at [phone] with questions or  concerns regarding your invoice.   IF you received labwork today, you will receive an invoice from [facility]. Please contact [facility] at [phone] with questions or concerns regarding your invoice.   Our billing staff will not be able to assist you with questions regarding bills from these companies.  You will be contacted with the lab results as soon as they are available. The fastest way to get your results is to activate your My Chart account. Instructions are located on the last page of this paperwork. If you have not heard from [place] regarding the results in 2 weeks, please contact this office.

## 2017-06-04 NOTE — Progress Notes (Signed)
    MRN: [id number] DOB: 1970/06/27  Subjective:   Crystal Bryant is a 47 y.o. female presenting for 1 week history of persistent rash over her arms bilaterally.  Rash is itchy and happens in the spring and summer when she is out in the sun.  She would also like to discuss her anxiety.  States that ever since she got treated for cancer she has had a very difficult time coping with her health.  She is currently in remission.  Admits that she gets stressed easily, has a difficult time controlling her worrying about any little thing.  Also feels that random things really set her off and previously would never really bother her.  She states that she has a very difficult time sleeping and calming her mind.  She is trying to get counseling through her employer but does not feel that it is helping.  She is agreeable to starting anxiety medication.  Denies SI, HI.  She has not previously tried any antidepressant or antianxiety medications.  Crystal Bryant has a current medication list which includes the following prescription(s): carvedilol, furosemide, potassium chloride sa, sacubitril-valsartan, sitagliptin-metformin, and amlodipine. Also has No Known Allergies.  Crystal Bryant  has a past medical history of Anxiety, Diabetes mellitus without complication ([place]), Hypertension, and Lung cancer, upper lobe ([place]) (09/2015). Also  has a past surgical history that includes Cesarean section; Abdominal hysterectomy; Lobectomy (Right, 10/06/2015); and Breast surgery.  Objective:   Vitals: BP (!) 150/120   Pulse (!) 101   Temp 98.2 F (36.8 C) (Oral)   Resp 16   Ht 5\' 5"  (1.651 m)   Wt 150 lb 6.4 oz (68.2 kg)   SpO2 99%   BMI 25.03 kg/m   BP Readings from Last 3 Encounters:  06/04/17 (!) 150/120  05/14/17 (!) 178/124  05/03/17 (!) 164/114    Physical Exam  Constitutional: She is oriented to person, place, and time. She appears well-developed and well-nourished.  Cardiovascular: Normal rate.  Pulmonary/Chest:  Effort normal.  Neurological: She is alert and oriented to person, place, and time.  Skin: Skin is warm and dry. Rash noted.  Papular, dry, scaly rash over sun exposed areas of her elbows bilaterally.  Psychiatric:  Patient is tearful in the exam room while talking about her anxiety and sleep.  She feels overwhelmed discussing her new health maintenance s/p lung cancer treatment.  She is not homicidal or suicidal.   Assessment and Plan :   Photodermatitis or photosensitivity  Rash and nonspecific skin eruption  Anxiety - Plan: Thyroid Panel With TSH  Insomnia, unspecified type - Plan: Thyroid Panel With TSH  Uncontrolled type 2 diabetes mellitus with hyperglycemia ([place])  Patient has had a difficult time with her anxiety and I am agreeable to using Xanax in the short-term while she establishes care for behavioral therapy and get started with Zoloft for maintenance therapy. Counseled patient on potential for adverse effects with medications prescribed today, patient verbalized understanding.  She is working with Dr. Brigitte Pulse on her chronic medical conditions, has a referral pending for an endocrinologist consult given her uncontrolled diabetes.  I will follow-up with labs from today including monitoring parameters for her blood pressure.  Currently, it is difficult to tease out how much of this is due to her insomnia and anxiety so I will discuss this with patient after labs result.  Return-to-clinic precautions discussed, patient verbalized understanding.   Jaynee Eagles, PA-C [facility] at [facility] [phone] 06/04/2017  4:39 PM

## 2017-06-05 LAB — THYROID PANEL WITH TSH
FREE THYROXINE INDEX: 2.2 (ref 1.2–4.9)
T3 Uptake Ratio: 25 % (ref 24–39)
T4, Total: 8.6 ug/dL (ref 4.5–12.0)
TSH: 0.455 u[IU]/mL (ref 0.450–4.500)

## 2017-07-24 ENCOUNTER — Other Ambulatory Visit: Payer: Self-pay | Admitting: Urgent Care

## 2017-08-03 DIAGNOSIS — H5213 Myopia, bilateral: Secondary | ICD-10-CM | POA: Diagnosis not present

## 2017-09-16 ENCOUNTER — Ambulatory Visit: Payer: 59 | Admitting: Internal Medicine

## 2017-10-03 ENCOUNTER — Encounter: Payer: Self-pay | Admitting: Internal Medicine

## 2018-01-01 ENCOUNTER — Other Ambulatory Visit: Payer: Self-pay | Admitting: Family Medicine

## 2018-01-01 DIAGNOSIS — Z1231 Encounter for screening mammogram for malignant neoplasm of breast: Secondary | ICD-10-CM

## 2018-01-18 ENCOUNTER — Ambulatory Visit: Payer: 59 | Admitting: Family Medicine

## 2018-02-03 ENCOUNTER — Ambulatory Visit
Admission: RE | Admit: 2018-02-03 | Discharge: 2018-02-03 | Disposition: A | Discharge status: Home / Self Care | Payer: 59 | Source: Ambulatory Visit | Attending: Family Medicine | Admitting: Family Medicine

## 2018-02-03 DIAGNOSIS — Z1231 Encounter for screening mammogram for malignant neoplasm of breast: Secondary | ICD-10-CM

## 2018-02-04 ENCOUNTER — Other Ambulatory Visit: Payer: Self-pay | Admitting: Family Medicine

## 2018-02-04 ENCOUNTER — Telehealth: Payer: Self-pay | Admitting: Family Medicine

## 2018-02-04 DIAGNOSIS — R928 Other abnormal and inconclusive findings on diagnostic imaging of breast: Secondary | ICD-10-CM

## 2018-02-04 NOTE — Telephone Encounter (Signed)
Copied from [place] [id number]. Topic: General - Inquiry >> Feb 04, 2018  2:09 PM Reyne Dumas L wrote: Reason for CRM:   Pt calling to find out of mammogram results are back yet. Pt can be reached at [phone]

## 2018-02-06 ENCOUNTER — Ambulatory Visit
Admission: RE | Admit: 2018-02-06 | Discharge: 2018-02-06 | Disposition: A | Discharge status: Home / Self Care | Payer: 59 | Source: Ambulatory Visit | Attending: Family Medicine | Admitting: Family Medicine

## 2018-02-06 ENCOUNTER — Other Ambulatory Visit: Payer: Self-pay | Admitting: Family Medicine

## 2018-02-06 DIAGNOSIS — R928 Other abnormal and inconclusive findings on diagnostic imaging of breast: Secondary | ICD-10-CM

## 2018-02-06 DIAGNOSIS — N6489 Other specified disorders of breast: Secondary | ICD-10-CM | POA: Diagnosis not present

## 2018-02-06 DIAGNOSIS — R921 Mammographic calcification found on diagnostic imaging of breast: Secondary | ICD-10-CM | POA: Diagnosis not present

## 2018-02-06 NOTE — Telephone Encounter (Signed)
pls see note 

## 2018-02-07 ENCOUNTER — Other Ambulatory Visit: Payer: 59

## 2018-02-08 ENCOUNTER — Ambulatory Visit: Payer: 59 | Admitting: Family Medicine

## 2018-02-08 ENCOUNTER — Encounter: Payer: Self-pay | Admitting: Physician Assistant

## 2018-02-08 ENCOUNTER — Ambulatory Visit: Payer: 59 | Admitting: Physician Assistant

## 2018-02-08 ENCOUNTER — Other Ambulatory Visit: Payer: Self-pay

## 2018-02-08 VITALS — BP 122/86 | HR 94 | Temp 98.5°F | Ht 65.0 in | Wt 146.8 lb

## 2018-02-08 DIAGNOSIS — F418 Other specified anxiety disorders: Secondary | ICD-10-CM

## 2018-02-08 DIAGNOSIS — R059 Cough, unspecified: Secondary | ICD-10-CM

## 2018-02-08 DIAGNOSIS — R0981 Nasal congestion: Secondary | ICD-10-CM

## 2018-02-08 DIAGNOSIS — R05 Cough: Secondary | ICD-10-CM | POA: Diagnosis not present

## 2018-02-08 DIAGNOSIS — R4589 Other symptoms and signs involving emotional state: Secondary | ICD-10-CM

## 2018-02-08 MED ORDER — LORATADINE-PSEUDOEPHEDRINE ER 5-120 MG PO TB12: 1.0000 | ORAL_TABLET | Freq: Two times a day (BID) | ORAL | 1 refills | Status: DC

## 2018-02-08 MED ORDER — FLUTICASONE PROPIONATE 50 MCG/ACT NA SUSP: 2.0000 | Freq: Every day | NASAL | 12 refills | Status: DC

## 2018-02-08 MED ORDER — ALPRAZOLAM 0.5 MG PO TABS: 0.5000 mg | ORAL_TABLET | Freq: Every evening | ORAL | 0 refills | Status: DC | PRN

## 2018-02-08 MED ORDER — ESCITALOPRAM OXALATE 10 MG PO TABS: 10.0000 mg | ORAL_TABLET | Freq: Every day | ORAL | 2 refills | Status: DC

## 2018-02-08 NOTE — Patient Instructions (Addendum)
1) Cough: I do not believe you have pneumonia or any other bacterial infection which requires antibiotics. Start using Flonase nasal spray 1-2 times daily - 2 sprays each nostril.  Start taking Claritin-D twice daily.  Use a Neti-Pot (or saline nasal spray) every morning (and at night if you need).  Stay well hydrated. Drink at least 60 oz of water/daily.   2) For anxiety: Start Lexapro 10 mg once daily (morning or evening, with or without food). After 2 weeks you can increase the dose to 84m if you feel like you need it.  It may take Lexapro a week or two to take effect.  (Please know you need to take this medication every day. Do not miss a dose. Plan to continue taking this medication at least 6-12 months. When you and your medical provider decide it's appropriate, you will need to taper off this medication.). Common side effects of SSRIs include, but are not limited to, GI upset, nausea, vomiting, insomnia, and drowsiness. Typically these side effects will resolve after 2 weeks.  Xanax is for acute symptoms: 0.25 - 0.5 mg as needed up to 3x/daily.  Try xanax at night before bed to help your mind stop swirling.   Come back and see [place] 4 weeks to recheck.   OTHER:   RELAXATION Consider practicing mindfulness meditation or other relaxation techniques such as deep breathing, prayer, yoga, tai chi, massage. See website mindful.org or the apps Headspace or Calm to help get started.   SLEEP  Regular exercise and reduced caffeine will help you sleep better. Practice good sleep hygeine techniques. See website sleep.org for more information.     For therapy -- LC[facility]ra FreeWMancel [facility]BB[place] -[phone] ([address]KL[address]olly[address] FilterKEstill Bakes[place] -[place]3[facility] - [phone] - [place]- [phone] ([phone]  Thank you for coming in today. I hope you feel we met your needs.  Feel free to call PCP if you have any questions or further requests.  Please consider signing up for MyChart if you do not already have it, as this is a great way to communicate with me.  Best,  Whitney McVey, PA-C   IF you received an x-ray today, you will receive an [facility] from [facility]. Please contact [facility] at [phone] questions or concerns regarding your invoice.   IF you received labwork today, you will receive an invoice from [facility] Please contact [facility] at [phone] questions or concerns regarding your invoice.   Our billing staff will not be able to assist you with questions regarding bills from these companies.  You will be contacted with the lab results as soon as they are available. The fastest way to get your results is to activate your My Chart account. Instructions are located on the last page of this paperwork. If you have not heard from u[place]aregarding the results in 2 weeks, please contact this office.

## 2018-02-08 NOTE — Progress Notes (Signed)
Crystal Bryant [facility]  MRN: [id number] DOB: 1970/10/22  PCP: Crystal Knapp, MD  Subjective:  Pt is a 48 year old female who presents to clinic for f/u anxiety and cough. PCP is Dr. Brigitte Bryant.  Anxiety related to health - started with cancer diagnosis. Cancer is currently in remission .  She started Zoloft at urgent care 05/2017. She stopped taking this 2/2 SE of HA.  Takes Xanax twice/week.  request refill of Xanax.  She has needle biopsy of breast due to abnormal MM. This is causing heightened anxiety. She is having difficulty sleeping and tears up.  Denies SI, HI.  Endorses cough x 3 weeks. Cough is productive. Endorses nasal congestion.  Taking mucinex. Not helping Denies fever, chills, shob, dyspnea.    Pt  has a past medical history of Anxiety, Diabetes mellitus without complication (Crystal Bryant), Hypertension, and Lung cancer, upper lobe ([place]) (09/2015).  Lab Results  Component Value Date   TSH 0.455 06/04/2017    Review of Systems  Constitutional: Negative for chills, diaphoresis, fatigue and fever.  HENT: Positive for congestion and rhinorrhea. Negative for postnasal drip, sinus pressure, sinus pain and sore throat.   Respiratory: Positive for cough. Negative for shortness of breath and wheezing.   Psychiatric/Behavioral: Positive for sleep disturbance. Negative for self-injury and suicidal ideas. The patient is nervous/anxious.     Patient Active Problem List   Diagnosis Date Noted  . History of malignant neoplasm of upper lobe bronchus or lung 03/02/2017  . Status post lobectomy of lung 03/02/2017  . Hyperlipidemia LDL goal <100 01/05/2017  . Essential hypertension 01/05/2017  . Type 2 diabetes mellitus with hyperglycemia, without long-term current use of insulin ([place]) 01/05/2017    Current Outpatient Medications on File Prior to Visit  Medication Sig Dispense Refill  . amLODipine (NORVASC) 5 MG tablet Take 1 tablet (5 mg total) by mouth daily. 30 tablet 11  . carvedilol  (COREG) 25 MG tablet Take 1.5 tablets (37.5 mg total) by mouth 2 (two) times daily. 270 tablet 3  . furosemide (LASIX) 40 MG tablet Take 1 tablet (40 mg total) by mouth daily. 90 tablet 3  . potassium chloride SA (K-DUR,KLOR-CON) 20 MEQ tablet Take 1 tablet (20 mEq total) by mouth daily. 90 tablet 3  . sacubitril-valsartan (ENTRESTO) 49-51 MG Take 1 tablet by mouth 2 (two) times daily. 60 tablet 11  . sitaGLIPtin-metformin (JANUMET) 50-1000 MG tablet Take 1 tablet 2 (two) times daily with a meal by mouth. 60 tablet 3  . triamcinolone cream (KENALOG) 0.1 % Apply 1 application topically 2 (two) times daily. 30 g 0  . ALPRAZolam (XANAX) 0.5 MG tablet Take 1 tablet (0.5 mg total) by mouth at bedtime as needed for anxiety. (Patient not taking: Reported on 02/08/2018) 30 tablet 0   No current facility-administered medications on file prior to visit.     No Known Allergies   Objective:  BP (!) 142/88   [place] 94   Temp 98.5 F (36.9 C) (Oral)   Ht 5\' 5"  (1.651 m)   Wt 146 lb 12.8 oz (66.6 kg)   SpO2 97%   BMI 24.43 kg/m   Physical Exam Vitals signs reviewed.  Constitutional:      General: She is not in acute distress. HENT:     Right Ear: Tympanic membrane normal.     Left Ear: Tympanic membrane normal.     Nose: Mucosal edema present. No rhinorrhea.     Right Sinus: No maxillary sinus tenderness or  frontal sinus tenderness.     Left Sinus: No maxillary sinus tenderness or frontal sinus tenderness.  Cardiovascular:     Heart sounds: Normal heart sounds.  Pulmonary:     Effort: Pulmonary effort is normal. No respiratory distress.     Breath sounds: Normal breath sounds. No wheezing, rhonchi or rales.  Skin:    General: Skin is warm and dry.  Neurological:     Mental Status: She is alert and oriented to person, place, and time.  Psychiatric:        Mood and Affect: Mood is anxious (mild).        Speech: Speech normal.        Behavior: Behavior normal.        Thought Content:  Thought content normal.        Judgment: Judgment normal.     Comments: Tearful when discussing needle biopsy     Assessment and Plan :  1. Anxiety about health -Patient presents for follow-up anxiety.  She started Prozac several weeks ago however stopped due to side effect of headache.  Will try Lexapro with instruction to taper up to 20 mg if needed after 2 weeks.  Return to clinic in 4 to 6 weeks to recheck.  Okay to refill Xanax. - escitalopram (LEXAPRO) 10 MG tablet; Take 1 tablet (10 mg total) by mouth daily. If needed, increase dose to 20mg /day after 2 weeks.  Dispense: 60 tablet; Refill: 2 - ALPRAZolam (XANAX) 0.5 MG tablet; Take 1 tablet (0.5 mg total) by mouth at bedtime as needed for anxiety.  Dispense: 30 tablet; Refill: 0  2. Nasal congestion 3. Cough -Patient complains of cough x3 weeks.  Vital signs are stable.  No abnormal findings on physical exam.  Suspect viral illness.  Plan to treat supportively, return to clinic in 7 days if no improvement - fluticasone (FLONASE) 50 MCG/ACT nasal spray; Place 2 sprays into both nostrils daily.  Dispense: 16 g; Refill: 12 - loratadine-pseudoephedrine (CLARITIN-D 12 HOUR) 5-120 MG tablet; Take 1 tablet by mouth 2 (two) times daily.  Dispense: 20 tablet; Refill: 1     Crystal Brookelle Pellicane, PA-C  [facility] [place] 02/08/2018 8:48 AM  Please note: Portions of this report may have been transcribed using dragon voice recognition software. Every effort was made to ensure accuracy; however, inadvertent computerized transcription errors may be present.

## 2018-02-10 ENCOUNTER — Other Ambulatory Visit: Payer: Self-pay | Admitting: Family Medicine

## 2018-02-10 ENCOUNTER — Ambulatory Visit
Admission: RE | Admit: 2018-02-10 | Discharge: 2018-02-10 | Disposition: A | Discharge status: Home / Self Care | Payer: 59 | Source: Ambulatory Visit | Attending: Family Medicine | Admitting: Family Medicine

## 2018-02-10 ENCOUNTER — Telehealth: Payer: Self-pay | Admitting: Physician Assistant

## 2018-02-10 DIAGNOSIS — R928 Other abnormal and inconclusive findings on diagnostic imaging of breast: Secondary | ICD-10-CM

## 2018-02-10 DIAGNOSIS — N641 Fat necrosis of breast: Secondary | ICD-10-CM | POA: Diagnosis not present

## 2018-02-10 DIAGNOSIS — R921 Mammographic calcification found on diagnostic imaging of breast: Secondary | ICD-10-CM | POA: Diagnosis not present

## 2018-02-10 HISTORY — PX: BREAST BIOPSY: SHX20

## 2018-02-10 MED FILL — ALPRAZolam 0.5 MG TABS: 0.5 | 30 days supply | Qty: 30 | Fill #0

## 2018-02-10 MED FILL — ESCITALOPRAM 10 MG TABLET: 10 | 30 days supply | Qty: 60 | Fill #0

## 2018-02-10 NOTE — Telephone Encounter (Signed)
Patient has put in for FMLA for her last OV with [employer] for anxiety and head congestion. I do not see that we took her out of work for anything or stated that she may need time off in the future. I was not sure how to complete the forms and wanted to make sure this was something that you wanted to do before placing them in your box.  Please let me know.  Thank you

## 2018-02-11 ENCOUNTER — Encounter: Payer: Self-pay | Admitting: Family Medicine

## 2018-02-11 NOTE — Telephone Encounter (Signed)
Have been sent to [facility] and radiologist documented that pt was called by phone and results discussed with her. - BENIGN fat necrosis w/ calcifications in right upper outer on biopsy

## 2018-02-12 ENCOUNTER — Ambulatory Visit: Payer: 59

## 2018-04-23 ENCOUNTER — Other Ambulatory Visit: Payer: Self-pay | Admitting: Physician Assistant

## 2018-04-23 DIAGNOSIS — F418 Other specified anxiety disorders: Secondary | ICD-10-CM

## 2018-04-24 MED ORDER — ALPRAZOLAM 0.5 MG PO TABS: 0.5000 mg | ORAL_TABLET | Freq: Every evening | ORAL | 0 refills | Status: DC | PRN

## 2018-04-26 ENCOUNTER — Emergency Department (HOSPITAL_COMMUNITY): Payer: 59

## 2018-04-26 ENCOUNTER — Encounter (HOSPITAL_COMMUNITY): Payer: Self-pay | Admitting: *Deleted

## 2018-04-26 ENCOUNTER — Emergency Department (HOSPITAL_COMMUNITY)
Admission: EM | Admit: 2018-04-26 | Discharge: 2018-04-26 | Disposition: A | Discharge status: Home / Self Care | Payer: 59 | Attending: Emergency Medicine | Admitting: Emergency Medicine

## 2018-04-26 ENCOUNTER — Other Ambulatory Visit: Payer: Self-pay

## 2018-04-26 DIAGNOSIS — Z85118 Personal history of other malignant neoplasm of bronchus and lung: Secondary | ICD-10-CM | POA: Insufficient documentation

## 2018-04-26 DIAGNOSIS — I509 Heart failure, unspecified: Secondary | ICD-10-CM | POA: Diagnosis not present

## 2018-04-26 DIAGNOSIS — E119 Type 2 diabetes mellitus without complications: Secondary | ICD-10-CM | POA: Diagnosis not present

## 2018-04-26 DIAGNOSIS — Z79899 Other long term (current) drug therapy: Secondary | ICD-10-CM | POA: Diagnosis not present

## 2018-04-26 DIAGNOSIS — R0602 Shortness of breath: Secondary | ICD-10-CM | POA: Diagnosis not present

## 2018-04-26 DIAGNOSIS — I110 Hypertensive heart disease with heart failure: Secondary | ICD-10-CM | POA: Diagnosis not present

## 2018-04-26 DIAGNOSIS — Z7984 Long term (current) use of oral hypoglycemic drugs: Secondary | ICD-10-CM | POA: Insufficient documentation

## 2018-04-26 LAB — CBC WITH DIFFERENTIAL/PLATELET
Abs Immature Granulocytes: 0.04 10*3/uL (ref 0.00–0.07)
BASOS PCT: 0 %
Basophils Absolute: 0.1 10*3/uL (ref 0.0–0.1)
EOS ABS: 0.1 10*3/uL (ref 0.0–0.5)
Eosinophils Relative: 0 %
HCT: 44.1 % (ref 36.0–46.0)
Hemoglobin: 14.9 g/dL (ref 12.0–15.0)
Immature Granulocytes: 0 %
Lymphocytes Relative: 32 %
Lymphs Abs: 4.4 10*3/uL — ABNORMAL HIGH (ref 0.7–4.0)
MCH: 27.9 pg (ref 26.0–34.0)
MCHC: 33.8 g/dL (ref 30.0–36.0)
MCV: 82.6 fL (ref 80.0–100.0)
Monocytes Absolute: 0.7 10*3/uL (ref 0.1–1.0)
Monocytes Relative: 5 %
Neutro Abs: 8.4 10*3/uL — ABNORMAL HIGH (ref 1.7–7.7)
Neutrophils Relative %: 63 %
PLATELETS: 377 10*3/uL (ref 150–400)
RBC: 5.34 MIL/uL — AB (ref 3.87–5.11)
RDW: 11.9 % (ref 11.5–15.5)
WBC: 13.7 10*3/uL — ABNORMAL HIGH (ref 4.0–10.5)
nRBC: 0 % (ref 0.0–0.2)

## 2018-04-26 LAB — BRAIN NATRIURETIC PEPTIDE: B Natriuretic Peptide: 194 pg/mL — ABNORMAL HIGH (ref 0.0–100.0)

## 2018-04-26 LAB — BASIC METABOLIC PANEL
Anion gap: 11 (ref 5–15)
BUN: 13 mg/dL (ref 6–20)
CO2: 21 mmol/L — ABNORMAL LOW (ref 22–32)
Calcium: 10 mg/dL (ref 8.9–10.3)
Chloride: 106 mmol/L (ref 98–111)
Creatinine, Ser: 0.76 mg/dL (ref 0.44–1.00)
GFR calc Af Amer: >60 mL/min (ref >60)
GFR calc non Af Amer: >60 mL/min (ref >60)
Glucose, Bld: 392 mg/dL — ABNORMAL HIGH (ref 70–99)
Potassium: 3.8 mmol/L (ref 3.5–5.1)
SODIUM: 138 mmol/L (ref 135–145)

## 2018-04-26 LAB — TROPONIN I: Troponin I: 0.03 ng/mL (ref <0.03)

## 2018-04-26 MED ORDER — IPRATROPIUM-ALBUTEROL 0.5-2.5 (3) MG/3ML IN SOLN
3.0000 mL | Freq: Once | RESPIRATORY_TRACT | Status: AC
  Administered 2018-04-26: 3 mL via RESPIRATORY_TRACT
  Filled 2018-04-26: qty 3

## 2018-04-26 MED ORDER — FUROSEMIDE 10 MG/ML IJ SOLN
40.0000 mg | Freq: Once | INTRAMUSCULAR | Status: AC
  Administered 2018-04-26: 40 mg via INTRAVENOUS
  Filled 2018-04-26: qty 4

## 2018-04-26 NOTE — ED Notes (Signed)
Pt updated on plan of care,  

## 2018-04-26 NOTE — ED Notes (Signed)
Date and time results received: 04/26/18 03.40 (use smartphrase ".now" to insert current time)  Test: trop 0.03 Critical Value: 0.03 Name of Provider Notified: Dr Stark Jock  Orders Received? Or Actions Taken?: none

## 2018-04-26 NOTE — ED Triage Notes (Signed)
Pt c/o sob that started tonight, denies any new cough, states that she always has a cough but that it is not any worse, denies any pain, denies any fever or chills,

## 2018-04-26 NOTE — ED Notes (Signed)
Ambulated around nurses station. O2 sats sustained 95-98% RA. Pt had to stop and rest halfway around the nurses station. Heart rate reached in 130s. Dr. Stark Jock notified.

## 2018-04-26 NOTE — ED Notes (Signed)
ED Provider at bedside. 

## 2018-04-26 NOTE — ED Provider Notes (Signed)
[facility] EMERGENCY DEPARTMENT Provider Note   CSN: [id number] Arrival date & time: 04/26/18  0242    History   Chief Complaint Chief Complaint  Patient presents with  . Shortness of Breath    HPI Crystal Bryant is a 48 y.o. female.     Patient is a 48 year old female with past medical history of prior lobectomy secondary to lung cancer, hypertension, type 2 diabetes.  She presents tonight with complaints of shortness of breath.  This came on this evening.  She describes feeling more short of breath when she lies flat.  She has had some cough starting this evening that is nonproductive.  She denies any fevers.  She denies any ill contacts.  Patient is a non-smoker.  The history is provided by the patient.  Shortness of Breath  Severity:  Moderate Onset quality:  Sudden Duration:  3 hours Timing:  Constant Progression:  Worsening Chronicity:  New Relieved by:  Nothing Exacerbated by: Lying flat. Ineffective treatments:  None tried   Past Medical History:  Diagnosis Date  . Anxiety   . Diabetes mellitus without complication ([place])   . Hypertension   . Lung cancer, upper lobe ([place]) 09/2015   Non-small cell in right upper lobe treated with RULectomy by VATS at [facility]    Patient Active Problem List   Diagnosis Date Noted  . History of malignant neoplasm of upper lobe bronchus or lung 03/02/2017  . Status post lobectomy of lung 03/02/2017  . Hyperlipidemia LDL goal <100 01/05/2017  . Essential hypertension 01/05/2017  . Type 2 diabetes mellitus with hyperglycemia, without long-term current use of insulin ([place]) 01/05/2017    Past Surgical History:  Procedure Laterality Date  . ABDOMINAL HYSTERECTOMY    . BREAST BIOPSY Right 02/10/2018   Biopsy of upper outer quadrant of right breast due to calcifications seen on mammography revealed fat necrosis with calcifications.  Benign so continue annual screening mammograms.  Marland Kitchen BREAST SURGERY    . CESAREAN SECTION     . LOBECTOMY Right 10/06/2015   May lung cancer treated with VATS right upper lobectomy by Dr. [name] at [facility].     OB History    Gravida  2   Para  2   Term  2   Preterm      AB      Living        SAB      TAB      Ectopic      Multiple      Live Births               Home Medications    Prior to Admission medications   Medication Sig Start Date End Date Taking? Authorizing Provider  ALPRAZolam Duanne Moron) 0.5 MG tablet Take 1 tablet (0.5 mg total) by mouth at bedtime as needed for anxiety. 04/24/18   Forrest Moron, MD  amLODipine (NORVASC) 5 MG tablet Take 1 tablet (5 mg total) by mouth daily. 05/03/17   Nahser, Wonda Cheng, MD  carvedilol (COREG) 25 MG tablet Take 1.5 tablets (37.5 mg total) by mouth 2 (two) times daily. 05/14/17   Nahser, Wonda Cheng, MD  escitalopram (LEXAPRO) 10 MG tablet Take 1 tablet (10 mg total) by mouth daily. If needed, increase dose to 20mg /day after 2 weeks. 02/08/18   McVey, Gelene Mink, PA-C  fluticasone (FLONASE) 50 MCG/ACT nasal spray Place 2 sprays into both nostrils daily. 02/08/18   McVey, Gelene Mink, PA-C  furosemide (LASIX) 40  MG tablet Take 1 tablet (40 mg total) by mouth daily. 05/06/17   Nahser, Wonda Cheng, MD  loratadine-pseudoephedrine (CLARITIN-D 12 HOUR) 5-120 MG tablet Take 1 tablet by mouth 2 (two) times daily. 02/08/18   McVey, Gelene Mink, PA-C  potassium chloride SA (K-DUR,KLOR-CON) 20 MEQ tablet Take 1 tablet (20 mEq total) by mouth daily. 05/06/17   Nahser, Wonda Cheng, MD  sacubitril-valsartan (ENTRESTO) 49-51 MG Take 1 tablet by mouth 2 (two) times daily. 05/14/17   Nahser, Wonda Cheng, MD  sitaGLIPtin-metformin (JANUMET) 50-1000 MG tablet Take 1 tablet 2 (two) times daily with a meal by mouth. 12/15/16   Shawnee Knapp, MD  triamcinolone cream (KENALOG) 0.1 % Apply 1 application topically 2 (two) times daily. 06/04/17   Jaynee Eagles, PA-C    Family History Family History  Problem Relation Age of Onset  . Diabetes Mother    . Hypertension Mother   . Stroke Father   . Breast cancer Neg Hx     Social History Social History   Tobacco Use  . Smoking status: Never Smoker  . Smokeless tobacco: Never Used  Substance Use Topics  . Alcohol use: No  . Drug use: No     Allergies   Patient has no known allergies.   Review of Systems Review of Systems  Respiratory: Positive for shortness of breath.   All other systems reviewed and are negative.    Physical Exam Updated Vital Signs BP (!) 179/133   Pulse (!) 143   Temp 99.1 F (37.3 C) (Oral)   Resp (!) 26   Ht 5\' 5"  (1.651 m)   Wt 65.8 kg   SpO2 (!) 88%   BMI 24.13 kg/m   Physical Exam Vitals signs and nursing note reviewed.  Constitutional:      General: She is not in acute distress.    Appearance: She is well-developed. She is not diaphoretic.  HENT:     Head: Normocephalic and atraumatic.  Neck:     Musculoskeletal: Normal range of motion and neck supple.  Cardiovascular:     Rate and Rhythm: Normal rate and regular rhythm.     Heart sounds: No murmur. No friction rub. No gallop.   Pulmonary:     Effort: Pulmonary effort is normal. No respiratory distress.     Breath sounds: Normal breath sounds. No wheezing.  Abdominal:     General: Bowel sounds are normal. There is no distension.     Palpations: Abdomen is soft.     Tenderness: There is no abdominal tenderness.  Musculoskeletal: Normal range of motion.     Right lower leg: She exhibits no tenderness. No edema.     Left lower leg: She exhibits no tenderness. No edema.  Skin:    General: Skin is warm and dry.  Neurological:     Mental Status: She is alert and oriented to person, place, and time.      ED Treatments / Results  Labs (all labs ordered are listed, but only abnormal results are displayed) Labs Reviewed  CBC WITH DIFFERENTIAL/PLATELET - Abnormal; Notable for the following components:      Result Value   WBC 13.7 (*)    RBC 5.34 (*)    Neutro Abs 8.4 (*)     Lymphs Abs 4.4 (*)    All other components within normal limits  BASIC METABOLIC PANEL  BRAIN NATRIURETIC PEPTIDE  TROPONIN I    EKG EKG Interpretation  Date/Time:  Saturday April 26 2018 02:53:45  EDT Ventricular Rate:  142 PR Interval:    QRS Duration: 143 QT Interval:  347 QTC Calculation: 534 R Axis:   -46 Text Interpretation:  Sinus tachycardia Probable left atrial enlargement Left bundle branch block Baseline wander in lead(s) II III aVF V1 V2 V6 Confirmed by Veryl Speak [phone]) on 04/26/2018 3:15:24 AM   Radiology No results found.  Procedures Procedures (including critical care time)  Medications Ordered in ED Medications - No data to display   Initial Impression / Assessment and Plan / ED Course  I have reviewed the triage vital signs and the nursing notes.  Pertinent labs & imaging results that were available during my care of the patient were reviewed by me and considered in my medical decision making (see chart for details).  Patient presenting here with complaints of shortness of breath.  She has a history of CHF and prior lobectomy.  She presents today with dyspnea that is worse with lying flat.  Her BNP is elevated at nearly 200 which is above her baseline and I suspect that this is an exacerbation of CHF.  She was given IV Lasix and 1 breathing treatment and is feeling significantly improved.  She is now saturating in the upper 90s and heart rate is improving, as is her blood pressure.  She will be discharged with an increase in her Lasix and return as needed for any problems.  Final Clinical Impressions(s) / ED Diagnoses   Final diagnoses:  None    ED Discharge Orders    None       Veryl Speak, MD 04/26/18 [phone]

## 2018-04-26 NOTE — ED Notes (Signed)
Pt ambulated to the bathroom. O2 sats at 92% RA on return.

## 2018-04-26 NOTE — Discharge Instructions (Addendum)
Increase your Lasix to 40 mg twice daily.  Return to the emergency department if you develop severe chest pain, worsening breathing, high fever, or other new and concerning symptoms.

## 2018-04-26 NOTE — ED Notes (Signed)
Date and time results received: 04/26/18 /now  (use smartphrase ".now" to insert current time)  Test: trop  Critical Value: 0.04  Name of Provider Notified: delo  Orders Received? Or Actions Taken?: none at this time

## 2018-06-27 MED FILL — ALPRAZolam 0.5 MG TABS: 0.5 | 30 days supply | Qty: 30 | Fill #0

## 2018-07-07 ENCOUNTER — Other Ambulatory Visit: Payer: Self-pay

## 2018-07-07 ENCOUNTER — Emergency Department (HOSPITAL_COMMUNITY)
Admission: EM | Admit: 2018-07-07 | Discharge: 2018-07-08 | Disposition: A | Discharge status: Home / Self Care | Payer: 59 | Source: Home / Self Care | Attending: Emergency Medicine | Admitting: Emergency Medicine

## 2018-07-07 ENCOUNTER — Emergency Department (HOSPITAL_COMMUNITY): Payer: 59

## 2018-07-07 ENCOUNTER — Encounter (HOSPITAL_COMMUNITY): Payer: Self-pay | Admitting: Emergency Medicine

## 2018-07-07 DIAGNOSIS — E1165 Type 2 diabetes mellitus with hyperglycemia: Secondary | ICD-10-CM | POA: Diagnosis not present

## 2018-07-07 DIAGNOSIS — R0602 Shortness of breath: Secondary | ICD-10-CM | POA: Diagnosis not present

## 2018-07-07 DIAGNOSIS — U071 COVID-19: Secondary | ICD-10-CM

## 2018-07-07 DIAGNOSIS — F419 Anxiety disorder, unspecified: Secondary | ICD-10-CM | POA: Diagnosis not present

## 2018-07-07 DIAGNOSIS — I110 Hypertensive heart disease with heart failure: Secondary | ICD-10-CM | POA: Diagnosis not present

## 2018-07-07 DIAGNOSIS — J1289 Other viral pneumonia: Secondary | ICD-10-CM | POA: Diagnosis not present

## 2018-07-07 DIAGNOSIS — A4189 Other specified sepsis: Secondary | ICD-10-CM | POA: Diagnosis not present

## 2018-07-07 DIAGNOSIS — R509 Fever, unspecified: Secondary | ICD-10-CM | POA: Diagnosis not present

## 2018-07-07 DIAGNOSIS — E119 Type 2 diabetes mellitus without complications: Secondary | ICD-10-CM | POA: Insufficient documentation

## 2018-07-07 DIAGNOSIS — N3000 Acute cystitis without hematuria: Secondary | ICD-10-CM | POA: Diagnosis not present

## 2018-07-07 DIAGNOSIS — I5022 Chronic systolic (congestive) heart failure: Secondary | ICD-10-CM | POA: Diagnosis not present

## 2018-07-07 DIAGNOSIS — I10 Essential (primary) hypertension: Secondary | ICD-10-CM | POA: Insufficient documentation

## 2018-07-07 DIAGNOSIS — Z79899 Other long term (current) drug therapy: Secondary | ICD-10-CM | POA: Insufficient documentation

## 2018-07-07 DIAGNOSIS — R000 Tachycardia, unspecified: Secondary | ICD-10-CM | POA: Diagnosis not present

## 2018-07-07 DIAGNOSIS — R739 Hyperglycemia, unspecified: Secondary | ICD-10-CM | POA: Diagnosis not present

## 2018-07-07 DIAGNOSIS — R51 Headache: Secondary | ICD-10-CM | POA: Diagnosis not present

## 2018-07-07 DIAGNOSIS — R0600 Dyspnea, unspecified: Secondary | ICD-10-CM | POA: Diagnosis not present

## 2018-07-07 DIAGNOSIS — R918 Other nonspecific abnormal finding of lung field: Secondary | ICD-10-CM | POA: Diagnosis not present

## 2018-07-07 DIAGNOSIS — J9601 Acute respiratory failure with hypoxia: Secondary | ICD-10-CM | POA: Diagnosis not present

## 2018-07-07 DIAGNOSIS — Z85118 Personal history of other malignant neoplasm of bronchus and lung: Secondary | ICD-10-CM | POA: Diagnosis not present

## 2018-07-07 LAB — COMPREHENSIVE METABOLIC PANEL
ALT: 34 U/L (ref 0–44)
AST: 32 U/L (ref 15–41)
Albumin: 3.4 g/dL — ABNORMAL LOW (ref 3.5–5.0)
Alkaline Phosphatase: 103 U/L (ref 38–126)
Anion gap: 16 — ABNORMAL HIGH (ref 5–15)
BUN: 12 mg/dL (ref 6–20)
CO2: 19 mmol/L — ABNORMAL LOW (ref 22–32)
Calcium: 8.5 mg/dL — ABNORMAL LOW (ref 8.9–10.3)
Chloride: 97 mmol/L — ABNORMAL LOW (ref 98–111)
Creatinine, Ser: 0.75 mg/dL (ref 0.44–1.00)
GFR calc Af Amer: >60 mL/min (ref >60)
GFR calc non Af Amer: >60 mL/min (ref >60)
Glucose, Bld: 315 mg/dL — ABNORMAL HIGH (ref 70–99)
Potassium: 3.8 mmol/L (ref 3.5–5.1)
Sodium: 132 mmol/L — ABNORMAL LOW (ref 135–145)
Total Bilirubin: 0.6 mg/dL (ref 0.3–1.2)
Total Protein: 7.6 g/dL (ref 6.5–8.1)

## 2018-07-07 LAB — CBC WITH DIFFERENTIAL/PLATELET
Abs Immature Granulocytes: 0.03 10*3/uL (ref 0.00–0.07)
Basophils Absolute: 0 10*3/uL (ref 0.0–0.1)
Basophils Relative: 0 %
Eosinophils Absolute: 0.1 10*3/uL (ref 0.0–0.5)
Eosinophils Relative: 1 %
HCT: 41.6 % (ref 36.0–46.0)
Hemoglobin: 13.7 g/dL (ref 12.0–15.0)
Immature Granulocytes: 1 %
Lymphocytes Relative: 16 %
Lymphs Abs: 1.1 10*3/uL (ref 0.7–4.0)
MCH: 27.7 pg (ref 26.0–34.0)
MCHC: 32.9 g/dL (ref 30.0–36.0)
MCV: 84.2 fL (ref 80.0–100.0)
Monocytes Absolute: 0.3 10*3/uL (ref 0.1–1.0)
Monocytes Relative: 5 %
Neutro Abs: 5.1 10*3/uL (ref 1.7–7.7)
Neutrophils Relative %: 77 %
Platelets: 192 10*3/uL (ref 150–400)
RBC: 4.94 MIL/uL (ref 3.87–5.11)
RDW: 11.9 % (ref 11.5–15.5)
WBC: 6.7 10*3/uL (ref 4.0–10.5)
nRBC: 0 % (ref 0.0–0.2)

## 2018-07-07 LAB — D-DIMER, QUANTITATIVE: D-Dimer, Quant: 0.48 ug/mL-FEU (ref 0.00–0.50)

## 2018-07-07 LAB — LACTIC ACID, PLASMA: Lactic Acid, Venous: 1.2 mmol/L (ref 0.5–1.9)

## 2018-07-07 LAB — SARS CORONAVIRUS 2 BY RT PCR (HOSPITAL ORDER, PERFORMED IN ~~LOC~~ HOSPITAL LAB): SARS Coronavirus 2: POSITIVE — AB

## 2018-07-07 MED ORDER — ACETAMINOPHEN 325 MG PO TABS
650.0000 mg | ORAL_TABLET | Freq: Once | ORAL | Status: AC
  Administered 2018-07-07: 650 mg via ORAL

## 2018-07-07 MED ORDER — HYDROCODONE-ACETAMINOPHEN 5-325 MG PO TABS
2.0000 | ORAL_TABLET | Freq: Once | ORAL | Status: AC
  Administered 2018-07-07: 2 via ORAL
  Filled 2018-07-07: qty 2

## 2018-07-07 MED ORDER — PREDNISONE 20 MG PO TABS
40.0000 mg | ORAL_TABLET | Freq: Once | ORAL | Status: AC
  Administered 2018-07-08: 40 mg via ORAL
  Filled 2018-07-07: qty 2

## 2018-07-07 MED ORDER — SODIUM CHLORIDE 0.9 % IV BOLUS (SEPSIS)
1000.0000 mL | Freq: Once | INTRAVENOUS | Status: AC
  Administered 2018-07-07: 1000 mL via INTRAVENOUS

## 2018-07-07 MED ORDER — ONDANSETRON HCL 4 MG PO TABS
4.0000 mg | ORAL_TABLET | Freq: Once | ORAL | Status: AC
  Administered 2018-07-07: 4 mg via ORAL
  Filled 2018-07-07: qty 1

## 2018-07-07 MED ORDER — ALBUTEROL SULFATE HFA 108 (90 BASE) MCG/ACT IN AERS
4.0000 | INHALATION_SPRAY | Freq: Once | RESPIRATORY_TRACT | Status: AC
  Administered 2018-07-08: 4 via RESPIRATORY_TRACT
  Filled 2018-07-07: qty 6.7

## 2018-07-07 MED ORDER — ACETAMINOPHEN 325 MG PO TABS
ORAL_TABLET | ORAL | Status: AC
  Filled 2018-07-07: qty 2

## 2018-07-07 MED ORDER — SODIUM CHLORIDE 0.9 % IV SOLN
1000.0000 mL | INTRAVENOUS | Status: DC
  Administered 2018-07-07: 1000 mL via INTRAVENOUS

## 2018-07-07 NOTE — ED Provider Notes (Signed)
[facility] EMERGENCY DEPARTMENT Provider Note   CSN: [id number] Arrival date & time: 07/07/18  1929    History   Chief Complaint Chief Complaint  Patient presents with  . Fever    HPI Crystal Bryant is a 48 y.o. female.     Patient is a 48 year old female who presents to the emergency department with a complaint of 3 days of fever in 2 days of a headache.  The patient states that the headache is new.  She says she does not usually get headaches.  She says the pain is mostly behind her eyes.  She says that light hurts her eyes.  She has noticed a change in her appetite and a change in her taste.  She has been able to drink liquids.  Patient states she is also had problems with cough fever and chills as well as some body aches.  She has been taking Tylenol for the fevers.  Patient states she has some shortness of breath, but she does not feel that it is much different than her usual.  She has had lung surgery for lung cancer in the past.  She is not coughing up any blood.  She is not had abdominal pain.  There is been no vaginal discharge or dysuria reported.  The patient presents to the emergency department because she is fearful of being dehydrated, and concerned about this new onset of headaches.  The history is provided by the patient.    Past Medical History:  Diagnosis Date  . Anxiety   . Diabetes mellitus without complication ([place])   . Hypertension   . Lung cancer, upper lobe ([employer]) 09/2015   Non-small cell in right upper lobe treated with RULectomy by VATS at [facility]    Patient Active Problem List   Diagnosis Date Noted  . History of malignant neoplasm of upper lobe bronchus or lung 03/02/2017  . Status post lobectomy of lung 03/02/2017  . Hyperlipidemia LDL goal <100 01/05/2017  . Essential hypertension 01/05/2017  . Type 2 diabetes mellitus with hyperglycemia, without long-term current use of insulin ([place]) 01/05/2017    Past Surgical History:  Procedure  Laterality Date  . ABDOMINAL HYSTERECTOMY    . BREAST BIOPSY Right 02/10/2018   Biopsy of upper outer quadrant of right breast due to calcifications seen on mammography revealed fat necrosis with calcifications.  Benign so continue annual screening mammograms.  Marland Kitchen BREAST SURGERY    . CESAREAN SECTION    . LOBECTOMY Right 10/06/2015   [place] lung cancer treated with VATS right upper lobectomy by Dr. [name] at [facility].     OB History    Gravida  2   Para  2   Term  2   Preterm      AB      Living        SAB      TAB      Ectopic      Multiple      Live Births               Home Medications    Prior to Admission medications   Medication Sig Start Date End Date Taking? Authorizing Provider  ALPRAZolam Duanne Moron) 0.5 MG tablet Take 1 tablet (0.5 mg total) by mouth at bedtime as needed for anxiety. 04/24/18   Forrest Moron, MD  amLODipine (NORVASC) 5 MG tablet Take 1 tablet (5 mg total) by mouth daily. 05/03/17   Nahser, Wonda Cheng, MD  carvedilol (  COREG) 25 MG tablet Take 1.5 tablets (37.5 mg total) by mouth 2 (two) times daily. 05/14/17   Nahser, Wonda Cheng, MD  escitalopram (LEXAPRO) 10 MG tablet Take 1 tablet (10 mg total) by mouth daily. If needed, increase dose to 20mg /day after 2 weeks. 02/08/18   McVey, Gelene Mink, PA-C  fluticasone (FLONASE) 50 MCG/ACT nasal spray Place 2 sprays into both nostrils daily. 02/08/18   McVey, Gelene Mink, PA-C  furosemide (LASIX) 40 MG tablet Take 1 tablet (40 mg total) by mouth daily. 05/06/17   Nahser, Wonda Cheng, MD  loratadine-pseudoephedrine (CLARITIN-D 12 HOUR) 5-120 MG tablet Take 1 tablet by mouth 2 (two) times daily. 02/08/18   McVey, Gelene Mink, PA-C  potassium chloride SA (K-DUR,KLOR-CON) 20 MEQ tablet Take 1 tablet (20 mEq total) by mouth daily. 05/06/17   Nahser, Wonda Cheng, MD  sacubitril-valsartan (ENTRESTO) 49-51 MG Take 1 tablet by mouth 2 (two) times daily. 05/14/17   Nahser, Wonda Cheng, MD  sitaGLIPtin-metformin  (JANUMET) 50-1000 MG tablet Take 1 tablet 2 (two) times daily with a meal by mouth. 12/15/16   Shawnee Knapp, MD  triamcinolone cream (KENALOG) 0.1 % Apply 1 application topically 2 (two) times daily. 06/04/17   Jaynee Eagles, PA-C    Family History Family History  Problem Relation Age of Onset  . Diabetes Mother   . Hypertension Mother   . Stroke Father   . Breast cancer Neg Hx     Social History Social History   Tobacco Use  . Smoking status: Never Smoker  . Smokeless tobacco: Never Used  Substance Use Topics  . Alcohol use: No  . Drug use: No     Allergies   Patient has no known allergies.   Review of Systems Review of Systems  Constitutional: Positive for fever. Negative for activity change.       All ROS Neg except as noted in HPI  HENT: Negative for nosebleeds.   Eyes: Negative for photophobia and discharge.  Respiratory: Negative for cough, shortness of breath and wheezing.   Cardiovascular: Negative for chest pain and palpitations.  Gastrointestinal: Negative for abdominal pain and blood in stool.  Genitourinary: Negative for dysuria, frequency and hematuria.  Musculoskeletal: Negative for arthralgias, back pain and neck pain.  Skin: Negative.   Neurological: Positive for headaches. Negative for dizziness, seizures and speech difficulty.  Psychiatric/Behavioral: Negative for confusion and hallucinations.     Physical Exam Updated Vital Signs BP (!) 168/117   Pulse (!) 126   Temp (!) 102.5 F (39.2 C) (Oral)   Resp 20   Ht 5\' 5"  (1.651 m)   Wt 68 kg   SpO2 96%   BMI 24.96 kg/m   Physical Exam Vitals signs and nursing note reviewed.  Constitutional:      Appearance: She is well-developed. She is ill-appearing. She is not toxic-appearing.  HENT:     Head: Normocephalic.     Right Ear: Tympanic membrane and external ear normal.     Left Ear: Tympanic membrane and external ear normal.  Eyes:     General: Lids are normal.     Pupils: Pupils are equal,  round, and reactive to light.  Neck:     Musculoskeletal: Normal range of motion and neck supple.     Vascular: No carotid bruit.  Cardiovascular:     Rate and Rhythm: Regular rhythm. Tachycardia present.     Pulses: Normal pulses.     Heart sounds: Normal heart sounds.  Pulmonary:  Effort: No respiratory distress.     Breath sounds: Normal breath sounds.     Comments: Symmetrical rise and fall of the chest.  Patient speaks in complete sentences.  There is mild cough on examination.  Coarse breath sounds noted. Abdominal:     General: Bowel sounds are normal.     Palpations: Abdomen is soft.     Tenderness: There is no abdominal tenderness. There is no guarding.  Musculoskeletal: Normal range of motion.     Right lower leg: No edema.     Left lower leg: No edema.  Lymphadenopathy:     Head:     Right side of head: No submandibular adenopathy.     Left side of head: No submandibular adenopathy.     Cervical: No cervical adenopathy.  Skin:    General: Skin is warm and dry.     Coloration: Skin is not jaundiced.     Findings: No rash.  Neurological:     Mental Status: She is alert and oriented to person, place, and time.     Cranial Nerves: No cranial nerve deficit.     Sensory: No sensory deficit.  Psychiatric:        Speech: Speech normal.      ED Treatments / Results  Labs (all labs ordered are listed, but only abnormal results are displayed) Labs Reviewed - No data to display  EKG None  Radiology No results found.  Procedures Procedures (including critical care time)  Medications Ordered in ED Medications  acetaminophen (TYLENOL) 325 MG tablet (has no administration in time range)  acetaminophen (TYLENOL) tablet 650 mg (650 mg Oral Given 07/07/18 1938)     Initial Impression / Assessment and Plan / ED Course  I have reviewed the triage vital signs and the nursing notes.  Pertinent labs & imaging results that were available during my care of the patient  were reviewed by me and considered in my medical decision making (see chart for details).          Final Clinical Impressions(s) / ED Diagnoses MDM Patient presents to the emergency department with complaint of headache and fever.  Temperature in the emergency department is 102.5, pulse rate elevated at 126, blood pressure is elevated at 168/117. COVID virus precautions taken. Portable chest x-ray shows new patchy bilateral lower lung and peripheral pulmonary opacities.  These are suspicious for acute viral/atypical respiratory infection. D-dimer is normal at 0.48 Complete blood count is within normal limits.   10:56pm - Lab reports pt is COVID POSITIVE.  Comprehensive metabolic panel shows the glucose to be elevated at 315 albumin is low at 3.4, and the anion gap is slightly elevated at 16.  The CO2 is slightly low at 19.  IV fluids given to the patient.  After IV fluids, the patient states she feels better.  Patient had Tylenol for temperature elevation and the temperature recheck is down to 99.  The glucose has improved to 288.  Patient is ambulatory without significant problem.  I have made the patient aware of the positive COVID test.  I have given the patient instructions on the discharge summary to quarantine herself over the next 14 days. PUlse ox 97-98% on room air. Patient has been given albuterol inhaler to use to assist with her breathing.  Patient will use Tylenol every 4 hours for fever, and or chills.  The patient also was noted on urine test to have a urinary tract infection, the culture has been sent to  the lab and the patient will be placed on Keflex 4 times daily for the next 5 days to assist with this infection.  The patient is to follow-up with the primary physician after the quarantine.  To ensure that the symptoms are improving.  I have been advised the patient that she will need to contact any and all personal contacts over the past week and inform them of the positive  COVID-19 tests.   Final diagnoses:  [id number] virus infection  Acute cystitis without hematuria    ED Discharge Orders         Ordered    HYDROcodone-acetaminophen (NORCO/VICODIN) 5-325 MG tablet  Every 4 hours PRN     07/08/18 0031    cephALEXin (KEFLEX) 500 MG capsule  4 times daily,   Status:  Discontinued     07/08/18 0035    cephALEXin (KEFLEX) 500 MG capsule  4 times daily     07/08/18 0036           Lily Kocher, PA-C 07/08/18 0047    Isla Pence, MD 07/08/18 1404

## 2018-07-07 NOTE — ED Triage Notes (Signed)
Pt c/o headache x 2 weeks and fever started Friday.

## 2018-07-08 LAB — URINALYSIS, ROUTINE W REFLEX MICROSCOPIC
Bacteria, UA: NONE SEEN
Bilirubin Urine: NEGATIVE
Glucose, UA: >=500 mg/dL — AB
Hgb urine dipstick: NEGATIVE
Ketones, ur: 20 mg/dL — AB
Nitrite: NEGATIVE
Protein, ur: 30 mg/dL — AB
Specific Gravity, Urine: 1.035 — ABNORMAL HIGH (ref 1.005–1.030)
pH: 5 (ref 5.0–8.0)

## 2018-07-08 LAB — CBG MONITORING, ED: Glucose-Capillary: 288 mg/dL — ABNORMAL HIGH (ref 70–99)

## 2018-07-08 MED ORDER — HYDROCODONE-ACETAMINOPHEN 5-325 MG PO TABS: 1.0000 | ORAL_TABLET | ORAL | 0 refills | Status: DC | PRN

## 2018-07-08 MED ORDER — CEPHALEXIN 500 MG PO CAPS: 500.0000 mg | ORAL_CAPSULE | Freq: Four times a day (QID) | ORAL | 0 refills | Status: DC

## 2018-07-08 MED FILL — CEPHALEXIN 500 MG CAPSULE: 500 | 5 days supply | Qty: 20 | Fill #0

## 2018-07-08 NOTE — Discharge Instructions (Addendum)
Your chest x-ray shows a viral pneumonia.  Your COVID-19 test is positive.  Please quarantine yourself for the next 14days.  At the end of the 14 days, please see your primary physician for recheck to ensure that this has resolved.  Please contact anyone and everyone that you have had contact with over the past week.  Please increase fluids.  Please use Tylenol every 4 hours for temperature of 100.4 or greater.  Please use albuterol 2 puffs every 4 hours.  Wash hands frequently.  Wipe off surfaces frequently.  Use your mask at all times.  Please return to the emergency department if any chest pain, shortness of breath, coughing up blood, temperature elevation that would not respond to the Tylenol, or ibuprofen, excessive weakness, nausea/vomiting, changes in your condition, worsening of your symptoms.

## 2018-07-09 ENCOUNTER — Other Ambulatory Visit: Payer: Self-pay

## 2018-07-09 ENCOUNTER — Encounter (HOSPITAL_COMMUNITY): Payer: Self-pay | Admitting: Emergency Medicine

## 2018-07-09 ENCOUNTER — Inpatient Hospital Stay (HOSPITAL_COMMUNITY)
Admission: EM | Admit: 2018-07-09 | Discharge: 2018-07-15 | DRG: 871 | Disposition: A | Discharge status: Home / Self Care | Payer: 59 | Attending: Internal Medicine | Admitting: Internal Medicine

## 2018-07-09 ENCOUNTER — Emergency Department (HOSPITAL_COMMUNITY): Payer: 59

## 2018-07-09 DIAGNOSIS — A419 Sepsis, unspecified organism: Secondary | ICD-10-CM | POA: Diagnosis not present

## 2018-07-09 DIAGNOSIS — Z7984 Long term (current) use of oral hypoglycemic drugs: Secondary | ICD-10-CM

## 2018-07-09 DIAGNOSIS — E1165 Type 2 diabetes mellitus with hyperglycemia: Secondary | ICD-10-CM | POA: Diagnosis present

## 2018-07-09 DIAGNOSIS — J069 Acute upper respiratory infection, unspecified: Secondary | ICD-10-CM | POA: Diagnosis not present

## 2018-07-09 DIAGNOSIS — I10 Essential (primary) hypertension: Secondary | ICD-10-CM | POA: Diagnosis not present

## 2018-07-09 DIAGNOSIS — R000 Tachycardia, unspecified: Secondary | ICD-10-CM | POA: Diagnosis not present

## 2018-07-09 DIAGNOSIS — J181 Lobar pneumonia, unspecified organism: Secondary | ICD-10-CM | POA: Diagnosis not present

## 2018-07-09 DIAGNOSIS — R0602 Shortness of breath: Secondary | ICD-10-CM | POA: Diagnosis not present

## 2018-07-09 DIAGNOSIS — I110 Hypertensive heart disease with heart failure: Secondary | ICD-10-CM | POA: Diagnosis present

## 2018-07-09 DIAGNOSIS — Z8249 Family history of ischemic heart disease and other diseases of the circulatory system: Secondary | ICD-10-CM

## 2018-07-09 DIAGNOSIS — F419 Anxiety disorder, unspecified: Secondary | ICD-10-CM | POA: Diagnosis present

## 2018-07-09 DIAGNOSIS — J1289 Other viral pneumonia: Secondary | ICD-10-CM | POA: Diagnosis present

## 2018-07-09 DIAGNOSIS — Z823 Family history of stroke: Secondary | ICD-10-CM | POA: Diagnosis not present

## 2018-07-09 DIAGNOSIS — Z86718 Personal history of other venous thrombosis and embolism: Secondary | ICD-10-CM

## 2018-07-09 DIAGNOSIS — J9601 Acute respiratory failure with hypoxia: Secondary | ICD-10-CM | POA: Diagnosis present

## 2018-07-09 DIAGNOSIS — R0600 Dyspnea, unspecified: Secondary | ICD-10-CM

## 2018-07-09 DIAGNOSIS — I5022 Chronic systolic (congestive) heart failure: Secondary | ICD-10-CM | POA: Diagnosis not present

## 2018-07-09 DIAGNOSIS — Z85118 Personal history of other malignant neoplasm of bronchus and lung: Secondary | ICD-10-CM | POA: Diagnosis not present

## 2018-07-09 DIAGNOSIS — Z833 Family history of diabetes mellitus: Secondary | ICD-10-CM | POA: Diagnosis not present

## 2018-07-09 DIAGNOSIS — Z902 Acquired absence of lung [part of]: Secondary | ICD-10-CM

## 2018-07-09 DIAGNOSIS — R739 Hyperglycemia, unspecified: Secondary | ICD-10-CM

## 2018-07-09 DIAGNOSIS — A4189 Other specified sepsis: Principal | ICD-10-CM | POA: Diagnosis present

## 2018-07-09 DIAGNOSIS — U071 COVID-19: Secondary | ICD-10-CM | POA: Diagnosis not present

## 2018-07-09 DIAGNOSIS — R918 Other nonspecific abnormal finding of lung field: Secondary | ICD-10-CM | POA: Diagnosis not present

## 2018-07-09 LAB — COMPREHENSIVE METABOLIC PANEL
ALT: 42 U/L (ref 0–44)
AST: 50 U/L — ABNORMAL HIGH (ref 15–41)
Albumin: 3.4 g/dL — ABNORMAL LOW (ref 3.5–5.0)
Alkaline Phosphatase: 122 U/L (ref 38–126)
Anion gap: 18 — ABNORMAL HIGH (ref 5–15)
BUN: 12 mg/dL (ref 6–20)
CO2: 18 mmol/L — ABNORMAL LOW (ref 22–32)
Calcium: 9 mg/dL (ref 8.9–10.3)
Chloride: 102 mmol/L (ref 98–111)
Creatinine, Ser: 0.81 mg/dL (ref 0.44–1.00)
GFR calc Af Amer: >60 mL/min (ref >60)
GFR calc non Af Amer: >60 mL/min (ref >60)
Glucose, Bld: 306 mg/dL — ABNORMAL HIGH (ref 70–99)
Potassium: 4.2 mmol/L (ref 3.5–5.1)
Sodium: 138 mmol/L (ref 135–145)
Total Bilirubin: 1.1 mg/dL (ref 0.3–1.2)
Total Protein: 8 g/dL (ref 6.5–8.1)

## 2018-07-09 LAB — CBC WITH DIFFERENTIAL/PLATELET
Abs Immature Granulocytes: 0.06 10*3/uL (ref 0.00–0.07)
Basophils Absolute: 0 10*3/uL (ref 0.0–0.1)
Basophils Relative: 0 %
Eosinophils Absolute: 0 10*3/uL (ref 0.0–0.5)
Eosinophils Relative: 0 %
HCT: 41.4 % (ref 36.0–46.0)
Hemoglobin: 13.3 g/dL (ref 12.0–15.0)
Immature Granulocytes: 1 %
Lymphocytes Relative: 7 %
Lymphs Abs: 0.8 10*3/uL (ref 0.7–4.0)
MCH: 27.7 pg (ref 26.0–34.0)
MCHC: 32.1 g/dL (ref 30.0–36.0)
MCV: 86.3 fL (ref 80.0–100.0)
Monocytes Absolute: 0.1 10*3/uL (ref 0.1–1.0)
Monocytes Relative: 1 %
Neutro Abs: 9.8 10*3/uL — ABNORMAL HIGH (ref 1.7–7.7)
Neutrophils Relative %: 91 %
Platelets: 235 10*3/uL (ref 150–400)
RBC: 4.8 MIL/uL (ref 3.87–5.11)
RDW: 12.2 % (ref 11.5–15.5)
WBC: 10.7 10*3/uL — ABNORMAL HIGH (ref 4.0–10.5)
nRBC: 0 % (ref 0.0–0.2)

## 2018-07-09 LAB — URINE CULTURE
Culture: 70000 — AB
Special Requests: NORMAL

## 2018-07-09 LAB — FIBRINOGEN: Fibrinogen: 601 mg/dL — ABNORMAL HIGH (ref 210–475)

## 2018-07-09 LAB — TROPONIN I: Troponin I: <0.03 ng/mL (ref <0.03)

## 2018-07-09 LAB — C-REACTIVE PROTEIN: CRP: 30 mg/dL — ABNORMAL HIGH (ref <1.0)

## 2018-07-09 LAB — TRIGLYCERIDES: Triglycerides: 171 mg/dL — ABNORMAL HIGH (ref <150)

## 2018-07-09 LAB — D-DIMER, QUANTITATIVE: D-Dimer, Quant: 0.72 ug/mL-FEU — ABNORMAL HIGH (ref 0.00–0.50)

## 2018-07-09 LAB — FERRITIN: Ferritin: 248 ng/mL (ref 11–307)

## 2018-07-09 LAB — LACTIC ACID, PLASMA
Lactic Acid, Venous: 1.6 mmol/L (ref 0.5–1.9)
Lactic Acid, Venous: 1 mmol/L (ref 0.5–1.9)

## 2018-07-09 LAB — BRAIN NATRIURETIC PEPTIDE: B Natriuretic Peptide: 118 pg/mL — ABNORMAL HIGH (ref 0.0–100.0)

## 2018-07-09 LAB — LACTATE DEHYDROGENASE: LDH: 339 U/L — ABNORMAL HIGH (ref 98–192)

## 2018-07-09 LAB — PROCALCITONIN: Procalcitonin: 0.44 ng/mL

## 2018-07-09 LAB — GLUCOSE, CAPILLARY
Glucose-Capillary: 305 mg/dL — ABNORMAL HIGH (ref 70–99)
Glucose-Capillary: 296 mg/dL — ABNORMAL HIGH (ref 70–99)

## 2018-07-09 LAB — ABO/RH: ABO/RH(D): O POS

## 2018-07-09 MED ORDER — SODIUM CHLORIDE 0.9 % IV SOLN
INTRAVENOUS | Status: DC
  Administered 2018-07-09: 13:00:00 via INTRAVENOUS

## 2018-07-09 MED ORDER — ONDANSETRON HCL 4 MG PO TABS: 4.0000 mg | ORAL_TABLET | Freq: Four times a day (QID) | ORAL | Status: DC | PRN

## 2018-07-09 MED ORDER — INSULIN ASPART 100 UNIT/ML ~~LOC~~ SOLN
0.0000 [IU] | Freq: Three times a day (TID) | SUBCUTANEOUS | Status: DC
  Administered 2018-07-09: 11 [IU] via SUBCUTANEOUS

## 2018-07-09 MED ORDER — INSULIN ASPART 100 UNIT/ML ~~LOC~~ SOLN
0.0000 [IU] | Freq: Three times a day (TID) | SUBCUTANEOUS | Status: DC
  Administered 2018-07-09: 8 [IU] via SUBCUTANEOUS
  Administered 2018-07-10: 11 [IU] via SUBCUTANEOUS
  Administered 2018-07-10 (×3): 8 [IU] via SUBCUTANEOUS
  Administered 2018-07-11: 11 [IU] via SUBCUTANEOUS
  Administered 2018-07-11: 5 [IU] via SUBCUTANEOUS
  Administered 2018-07-11: 11 [IU] via SUBCUTANEOUS
  Administered 2018-07-11: 8 [IU] via SUBCUTANEOUS
  Administered 2018-07-12: 5 [IU] via SUBCUTANEOUS
  Administered 2018-07-12: 8 [IU] via SUBCUTANEOUS
  Administered 2018-07-12: 11 [IU] via SUBCUTANEOUS
  Administered 2018-07-12: 5 [IU] via SUBCUTANEOUS
  Administered 2018-07-13: 8 [IU] via SUBCUTANEOUS
  Administered 2018-07-13 (×2): 3 [IU] via SUBCUTANEOUS
  Administered 2018-07-14: 15 [IU] via SUBCUTANEOUS
  Administered 2018-07-14: 3 [IU] via SUBCUTANEOUS
  Administered 2018-07-14: 5 [IU] via SUBCUTANEOUS
  Administered 2018-07-15: 09:00:00 12 [IU] via SUBCUTANEOUS

## 2018-07-09 MED ORDER — PANTOPRAZOLE SODIUM 40 MG PO TBEC
40.0000 mg | DELAYED_RELEASE_TABLET | Freq: Every day | ORAL | Status: DC
  Administered 2018-07-09 – 2018-07-15 (×7): 40 mg via ORAL
  Filled 2018-07-09 (×6): qty 1

## 2018-07-09 MED ORDER — HYDRALAZINE HCL 20 MG/ML IJ SOLN: 10.0000 mg | Freq: Three times a day (TID) | INTRAMUSCULAR | Status: DC | PRN

## 2018-07-09 MED ORDER — CARVEDILOL 12.5 MG PO TABS
25.0000 mg | ORAL_TABLET | Freq: Two times a day (BID) | ORAL | Status: DC
  Administered 2018-07-09 – 2018-07-15 (×13): 25 mg via ORAL
  Filled 2018-07-09 (×13): qty 2

## 2018-07-09 MED ORDER — ENOXAPARIN SODIUM 40 MG/0.4ML ~~LOC~~ SOLN
40.0000 mg | SUBCUTANEOUS | Status: DC
  Administered 2018-07-09: 40 mg via SUBCUTANEOUS

## 2018-07-09 MED ORDER — ENOXAPARIN SODIUM 40 MG/0.4ML ~~LOC~~ SOLN
SUBCUTANEOUS | Status: AC
  Filled 2018-07-09: qty 0.4

## 2018-07-09 MED ORDER — TRAMADOL HCL 50 MG PO TABS
100.0000 mg | ORAL_TABLET | Freq: Once | ORAL | Status: AC
  Administered 2018-07-09: 15:00:00 100 mg via ORAL
  Filled 2018-07-09: qty 2

## 2018-07-09 MED ORDER — ALBUTEROL SULFATE HFA 108 (90 BASE) MCG/ACT IN AERS
INHALATION_SPRAY | RESPIRATORY_TRACT | Status: AC
  Filled 2018-07-09: qty 6.7

## 2018-07-09 MED ORDER — ACETAMINOPHEN 500 MG PO TABS
1000.0000 mg | ORAL_TABLET | Freq: Once | ORAL | Status: AC
  Administered 2018-07-09: 1000 mg via ORAL
  Filled 2018-07-09: qty 2

## 2018-07-09 MED ORDER — SODIUM CHLORIDE 0.9 % IV SOLN: Freq: Once | INTRAVENOUS | Status: DC

## 2018-07-09 MED ORDER — ALPRAZOLAM 0.5 MG PO TABS
ORAL_TABLET | ORAL | Status: AC
  Administered 2018-07-09: 0.5 mg
  Filled 2018-07-09: qty 1

## 2018-07-09 MED ORDER — ALBUTEROL SULFATE HFA 108 (90 BASE) MCG/ACT IN AERS
2.0000 | INHALATION_SPRAY | Freq: Four times a day (QID) | RESPIRATORY_TRACT | Status: DC | PRN
  Administered 2018-07-09: 14:00:00 2 via RESPIRATORY_TRACT
  Filled 2018-07-09: qty 6.7

## 2018-07-09 MED ORDER — SODIUM CHLORIDE 0.9 % IV SOLN
200.0000 mg | Freq: Once | INTRAVENOUS | Status: AC
  Administered 2018-07-09: 200 mg via INTRAVENOUS
  Filled 2018-07-09: qty 40

## 2018-07-09 MED ORDER — SODIUM CHLORIDE 0.9 % IV SOLN
100.0000 mg | INTRAVENOUS | Status: AC
  Administered 2018-07-10 – 2018-07-13 (×4): 100 mg via INTRAVENOUS
  Filled 2018-07-09 (×4): qty 20

## 2018-07-09 MED ORDER — METHYLPREDNISOLONE ACETATE 40 MG/ML IJ SUSP
INTRAMUSCULAR | Status: AC
  Filled 2018-07-09: qty 1

## 2018-07-09 MED ORDER — ALPRAZOLAM 0.5 MG PO TABS
0.5000 mg | ORAL_TABLET | Freq: Two times a day (BID) | ORAL | Status: DC | PRN
  Administered 2018-07-10 – 2018-07-13 (×4): 0.5 mg via ORAL
  Filled 2018-07-09 (×4): qty 1

## 2018-07-09 MED ORDER — PANTOPRAZOLE SODIUM 40 MG PO TBEC
DELAYED_RELEASE_TABLET | ORAL | Status: AC
  Filled 2018-07-09: qty 1

## 2018-07-09 MED ORDER — INSULIN DETEMIR 100 UNIT/ML ~~LOC~~ SOLN
12.0000 [IU] | Freq: Every day | SUBCUTANEOUS | Status: DC
  Administered 2018-07-09 – 2018-07-14 (×6): 12 [IU] via SUBCUTANEOUS
  Filled 2018-07-09 (×7): qty 0.12

## 2018-07-09 MED ORDER — METHYLPREDNISOLONE SODIUM SUCC 125 MG IJ SOLR
INTRAMUSCULAR | Status: AC
  Filled 2018-07-09: qty 2

## 2018-07-09 MED ORDER — METHYLPREDNISOLONE SODIUM SUCC 40 MG IJ SOLR
40.0000 mg | Freq: Two times a day (BID) | INTRAMUSCULAR | Status: DC
  Administered 2018-07-09 – 2018-07-12 (×7): 40 mg via INTRAVENOUS
  Filled 2018-07-09 (×6): qty 1

## 2018-07-09 MED ORDER — ACETAMINOPHEN 325 MG PO TABS
650.0000 mg | ORAL_TABLET | Freq: Four times a day (QID) | ORAL | Status: DC | PRN
  Administered 2018-07-09 – 2018-07-11 (×3): 650 mg via ORAL
  Filled 2018-07-09 (×3): qty 2

## 2018-07-09 MED ORDER — ONDANSETRON HCL 4 MG/2ML IJ SOLN
4.0000 mg | Freq: Four times a day (QID) | INTRAMUSCULAR | Status: DC | PRN
  Administered 2018-07-14: 05:00:00 4 mg via INTRAVENOUS
  Filled 2018-07-09: qty 2

## 2018-07-09 NOTE — ED Provider Notes (Signed)
[facility] EMERGENCY DEPARTMENT Provider Note   CSN: [id number] Arrival date & time: 07/09/18  7035    History   Chief Complaint Chief Complaint  Patient presents with  . Shortness of Breath    HPI Crystal Bryant is a 48 y.o. female who  has a past medical history of Anxiety, Diabetes mellitus without complication ([place]), Hypertension, and Lung cancer, upper lobe ([place]) (09/2015). She is s/p lobectomy of the R upper lung.  The patient tested pos for COVID-19 x2 days ago. She c/o worsening dyspnea. She has associated fever, chills, body aches, fatigue, weakness, anosmia, ageusia, loss of appetite. She denies vomiting. She denies abdominal pain. Diarrhea, urinary symptoms, hx of DVT or PE. She denies chest pain       HPI  Past Medical History:  Diagnosis Date  . Anxiety   . Diabetes mellitus without complication ([place])   . Hypertension   . Lung cancer, upper lobe ([place]) 09/2015   Non-small cell in right upper lobe treated with RULectomy by VATS at [facility]    Patient Active Problem List   Diagnosis Date Noted  . Acute respiratory disease due to COVID-19 virus 07/09/2018  . Chronic systolic HF (heart failure) ([place]) 07/09/2018  . History of malignant neoplasm of upper lobe bronchus or lung 03/02/2017  . Status post lobectomy of lung 03/02/2017  . Hyperlipidemia LDL goal <100 01/05/2017  . Essential hypertension 01/05/2017  . Type 2 diabetes mellitus with hyperglycemia, without long-term current use of insulin ([place]) 01/05/2017    Past Surgical History:  Procedure Laterality Date  . ABDOMINAL HYSTERECTOMY    . BREAST BIOPSY Right 02/10/2018   Biopsy of upper outer quadrant of right breast due to calcifications seen on mammography revealed fat necrosis with calcifications.  Benign so continue annual screening mammograms.  Marland Kitchen BREAST SURGERY    . CESAREAN SECTION    . LOBECTOMY Right 10/06/2015   [place] lung cancer treated with VATS right upper lobectomy by Dr. [name] at [facility].    OB History    Gravida  2   Para  2   Term  2   Preterm      AB      Living        SAB      TAB      Ectopic      Multiple      Live Births               Home Medications    Prior to Admission medications   Medication Sig Start Date End Date Taking? Authorizing Provider  acetaminophen (TYLENOL) 500 MG tablet Take 1,000 mg by mouth every 6 (six) hours as needed for mild pain or moderate pain.   Yes [provider]  albuterol (VENTOLIN HFA) 108 (90 Base) MCG/ACT inhaler Inhale 2 puffs into the lungs every 4 (four) hours as needed for wheezing or shortness of breath.   Yes [provider]  carvedilol (COREG) 25 MG tablet Take 1.5 tablets (37.5 mg total) by mouth 2 (two) times daily. 05/14/17  Yes Nahser, Wonda Cheng, MD  fluticasone (FLONASE) 50 MCG/ACT nasal spray Place 2 sprays into both nostrils daily. 02/08/18  Yes McVey, Gelene Mink, PA-C  furosemide (LASIX) 40 MG tablet Take 1 tablet (40 mg total) by mouth daily. 05/06/17  Yes Nahser, Wonda Cheng, MD  sacubitril-valsartan (ENTRESTO) 49-51 MG Take 1 tablet by mouth 2 (two) times daily. 05/14/17  Yes Nahser, Wonda Cheng, MD  sitaGLIPtin-metformin (JANUMET) 50-1000 MG  tablet Take 1 tablet 2 (two) times daily with a meal by mouth. 12/15/16  Yes Shawnee Knapp, MD  ALPRAZolam Duanne Moron) 0.5 MG tablet Take 1 tablet (0.5 mg total) by mouth at bedtime as needed for anxiety. Patient not taking: Reported on 07/09/2018 04/24/18   Forrest Moron, MD  HYDROcodone-acetaminophen (NORCO/VICODIN) 5-325 MG tablet Take 1 tablet by mouth every 4 (four) hours as needed (for headache or severe pain.). 07/08/18   Lily Kocher, PA-C  loratadine-pseudoephedrine (CLARITIN-D 12 HOUR) 5-120 MG tablet Take 1 tablet by mouth 2 (two) times daily. 02/08/18   McVey, Gelene Mink, PA-C  potassium chloride SA (K-DUR,KLOR-CON) 20 MEQ tablet Take 1 tablet (20 mEq total) by mouth daily. 05/06/17   Nahser, Wonda Cheng, MD  triamcinolone cream  (KENALOG) 0.1 % Apply 1 application topically 2 (two) times daily. 06/04/17   Jaynee Eagles, PA-C    Family History Family History  Problem Relation Age of Onset  . Diabetes Mother   . Hypertension Mother   . Stroke Father   . Breast cancer Neg Hx     Social History Social History   Tobacco Use  . Smoking status: Never Smoker  . Smokeless tobacco: Never Used  Substance Use Topics  . Alcohol use: No  . Drug use: No     Allergies   Patient has no known allergies.   Review of Systems Review of Systems Ten systems reviewed and are negative for acute change, except as noted in the HPI.   Physical Exam Updated Vital Signs BP (!) 139/102   Pulse (!) 117   Temp 99.7 F (37.6 C) (Oral)   Resp (!) 41   Ht 5\' 5"  (1.651 m)   Wt 68 kg   SpO2 92%   BMI 24.95 kg/m   Physical Exam Vitals signs and nursing note reviewed.  Constitutional:      Appearance: She is well-developed. She is ill-appearing. She is not diaphoretic.  HENT:     Head: Normocephalic and atraumatic.  Eyes:     General: No scleral icterus.    Extraocular Movements: Extraocular movements intact.     Conjunctiva/sclera: Conjunctivae normal.     Pupils: Pupils are equal, round, and reactive to light.  Neck:     Musculoskeletal: Normal range of motion.  Cardiovascular:     Rate and Rhythm: Tachycardia present.     Heart sounds: No murmur. No friction rub. No gallop.   Pulmonary:     Effort: Tachypnea present. No accessory muscle usage or respiratory distress.  Abdominal:     General: Bowel sounds are normal. There is no distension.     Palpations: Abdomen is soft. There is no mass.     Tenderness: There is no abdominal tenderness. There is no guarding.  Musculoskeletal:     Right lower leg: No edema.     Left lower leg: No edema.  Skin:    General: Skin is warm.  Neurological:     Mental Status: She is alert and oriented to person, place, and time.  Psychiatric:        Behavior: Behavior normal.       ED Treatments / Results  Labs (all labs ordered are listed, but only abnormal results are displayed) Labs Reviewed  CBC WITH DIFFERENTIAL/PLATELET - Abnormal; Notable for the following components:      Result Value   WBC 10.7 (*)    Neutro Abs 9.8 (*)    All other components within normal limits  COMPREHENSIVE  METABOLIC PANEL - Abnormal; Notable for the following components:   CO2 18 (*)    Glucose, Bld 306 (*)    Albumin 3.4 (*)    AST 50 (*)    Anion gap 18 (*)    All other components within normal limits  D-DIMER, QUANTITATIVE (NOT AT [facility]) - Abnormal; Notable for the following components:   D-Dimer, Quant 0.72 (*)    All other components within normal limits  LACTATE DEHYDROGENASE - Abnormal; Notable for the following components:   LDH 339 (*)    All other components within normal limits  TRIGLYCERIDES - Abnormal; Notable for the following components:   Triglycerides 171 (*)    All other components within normal limits  FIBRINOGEN - Abnormal; Notable for the following components:   Fibrinogen 601 (*)    All other components within normal limits  C-REACTIVE PROTEIN - Abnormal; Notable for the following components:   CRP 30.0 (*)    All other components within normal limits  BRAIN NATRIURETIC PEPTIDE - Abnormal; Notable for the following components:   B Natriuretic Peptide 118.0 (*)    All other components within normal limits  CULTURE, BLOOD (ROUTINE X 2)  CULTURE, BLOOD (ROUTINE X 2)  LACTIC ACID, PLASMA  LACTIC ACID, PLASMA  PROCALCITONIN  FERRITIN  TROPONIN I  HIV ANTIBODY (ROUTINE TESTING W REFLEX)  HEMOGLOBIN A1C  ABO/RH    EKG EKG Interpretation  Date/Time:  Wednesday July 09 2018 09:44:10 EDT Ventricular Rate:  113 PR Interval:    QRS Duration: 131 QT Interval:  363 QTC Calculation: 498 R Axis:   -16 Text Interpretation:  Sinus tachycardia Left bundle branch block Confirmed by Gerlene Fee [phone]) on 07/09/2018 9:53:10 AM   Radiology Dg  Chest Port 1 View  Result Date: 07/09/2018 CLINICAL DATA:  Shortness of breath. History of lung carcinoma. Positive test for COVID-19 EXAM: PORTABLE CHEST 1 VIEW COMPARISON:  July 07, 2018 FINDINGS: There is airspace consolidation in the right lower lobe. There is more ill-defined airspace opacity in the left upper lobe and left base regions. Heart size and pulmonary vascularity are normal. No adenopathy. No bone lesions. IMPRESSION: Multifocal pneumonia with ground-glass type airspace opacity throughout much of the left lung. There is consolidation as well as airspace opacity in the right lower lung region. Stable cardiac silhouette.  No evident adenopathy. Electronically Signed   By: Lowella Grip III M.D.   On: 07/09/2018 10:02   Dg Chest Portable 1 View  Result Date: 07/07/2018 CLINICAL DATA:  48 year old female with headache and fever. History of right lung cancer and prior wedge resection. Testing for COVID-19 pending. EXAM: PORTABLE CHEST 1 VIEW COMPARISON:  04/26/2018 and earlier. FINDINGS: New patchy and indistinct bilateral lower lung and peripheral pulmonary opacities. Similar lung volumes. Mediastinal contours remain normal. Visualized tracheal air column is within normal limits. No pneumothorax, pulmonary edema or pleural effusion. Paucity of bowel gas in the upper abdomen. No osseous abnormality identified. IMPRESSION: Patchy and indistinct bilateral lower lung opacity suspicious for acute viral/atypical respiratory infection. No pleural effusion. Electronically Signed   By: Genevie Ann M.D.   On: 07/07/2018 21:40    Procedures Procedures (including critical care time)  Medications Ordered in ED Medications  methylPREDNISolone sodium succinate (SOLU-MEDROL) 40 mg/mL injection 40 mg (40 mg Intravenous Given 07/09/18 1236)  enoxaparin (LOVENOX) injection 40 mg (40 mg Subcutaneous Given 07/09/18 1235)  0.9 %  sodium chloride infusion ( Intravenous New Bag/Given 07/09/18 1231)  acetaminophen  (TYLENOL) tablet 650  mg (650 mg Oral Given 07/09/18 1409)  pantoprazole (PROTONIX) EC tablet 40 mg ( Oral Not Given 07/09/18 1251)  ondansetron (ZOFRAN) tablet 4 mg (has no administration in time range)    Or  ondansetron (ZOFRAN) injection 4 mg (has no administration in time range)  methylPREDNISolone acetate (DEPO-MEDROL) 40 MG/ML injection (  Not Given 07/09/18 1232)  methylPREDNISolone sodium succinate (SOLU-MEDROL) 125 mg/2 mL injection (  Not Given 07/09/18 1251)  insulin aspart (novoLOG) injection 0-15 Units (has no administration in time range)  insulin detemir (LEVEMIR) injection 12 Units (has no administration in time range)  albuterol (VENTOLIN HFA) 108 (90 Base) MCG/ACT inhaler 2 puff (2 puffs Inhalation Given 07/09/18 1411)  carvedilol (COREG) tablet 25 mg (25 mg Oral Given 07/09/18 1502)  albuterol (VENTOLIN HFA) 108 (90 Base) MCG/ACT inhaler (  Canceled Entry 07/09/18 1449)  hydrALAZINE (APRESOLINE) injection 10 mg (has no administration in time range)  ALPRAZolam (XANAX) tablet 0.5 mg (has no administration in time range)  acetaminophen (TYLENOL) tablet 1,000 mg (1,000 mg Oral Given 07/09/18 0954)  traMADol (ULTRAM) tablet 100 mg (100 mg Oral Given 07/09/18 1502)  ALPRAZolam (XANAX) 0.5 MG tablet (0.5 mg  Given 07/09/18 1503)     Initial Impression / Assessment and Plan / ED Course  I have reviewed the triage vital signs and the nursing notes.  Pertinent labs & imaging results that were available during my care of the patient were reviewed by me and considered in my medical decision making (see chart for details).  Clinical Course as of Jul 09 1626  Wed Jul 09, 2018  1003 I personally reviewed the patient's 1V cxr which shows acute BL patchy infiltrates and consolidations.  DG Chest Port 1 View [AH]  1006 I personally reviewed the EKG which shows sinus tachycardia and LBBB unchanged from previous tracings  EKG 12-Lead [AH]  1007 Temp(!): 101 F (38.3 C) [AH]  1007 Pulse  Rate(!): 116 [AH]  1007 Resp(!): 28 [AH]  1007 SpO2(!): 85 % [AH]  1007 On 2 L  SpO2: 94 % [AH]  1034 Doubt sepsis  Lactic Acid, Venous: 1.6 [AH]  1054 WBC(!): 10.7 [AH]  1055 BNP lower than previous  B Natriuretic Peptide(!): 118.0 [AH]  1055 Fibrinogen(!): 601 [AH]  1055 LDH(!): 339 [AH]  1055 CO2(!): 18 [AH]  1055 Glucose(!): 306 [AH]    Clinical Course User Index [AH] Jaeshawn Silvio, PA-C       FB:PZWCHEN- COVID-19 + VS: fever, cardia, tachypnea hypoxia improved on 2 L nasal cannula ID:[id number] is gathered by patient and review of EMR. DDX:The emergent differential diagnosis for shortness of breath includes, but is not limited to, Pulmonary edema, bronchoconstriction, Pneumonia, Pulmonary embolism, Pneumotherax/ Hemothorax, Dysrythmia, ACS.  Labs: I reviewed the labs which show elevated LDH, elevated d-dimer, elevated fibrinogen and BNP.  Troponin negative, mild leukocytosis with left shift, elevated glucose, negative lactic acidosis and procalcitonin Imaging: I personally reviewed the images (1 view chest x-ray which shows multi focal groundglass opacity pneumonia consistent with coronavirus infection) EKG:  EKG Interpretation  Date/Time:  Wednesday July 09 2018 09:44:10 EDT Ventricular Rate:  113 PR Interval:    QRS Duration: 131 QT Interval:  363 QTC Calculation: 498 R Axis:   -16 Text Interpretation:  Sinus tachycardia Left bundle branch block Confirmed by Gerlene Fee [phone]) on 07/09/2018 9:53:10 AM       MDM: Patient with multifocal pneumonia, acute hypoxic respiratory failure secondary to COVID-19 infection.  Need admission and supportive care.  She  is maintaining her oxygen saturations with 92% and above with 2 L via nasal cannula. Patient disposition: Admit Patient condition: Serious. The patient appears reasonably stabilized for admission considering the current resources, flow, and capabilities available in the ED at this time, and I doubt any other [facility]  requiring further screening and/or treatment in the ED prior to admission.   Final Clinical Impressions(s) / ED Diagnoses   Final diagnoses:  Acute respiratory failure with hypoxia ([place])  2019 novel coronavirus disease (COVID-19)  Hyperglycemia    ED Discharge Orders    None       Margarita Mail, PA-C 07/09/18 1628    Maudie Flakes, MD 07/10/18 [phone]

## 2018-07-09 NOTE — H&P (Signed)
History and Physical    Lulabelle Desta ERD:[id number] DOB: 03/26/1970 DOA: 07/09/2018  Referring MD/NP/PA: Dr. Sedonia Small and Margarita Mail PA PCP: Shawnee Knapp, MD  Patient coming from: home  Chief Complaint: Increased shortness of breath, general malaise and fever.  HPI: Crystal Bryant is a 48 y.o. female with a past medical history significant for anxiety, hypertension, diabetes mellitus type 2, systolic HF (EF 56%) and non-small cell and right upper lobe treated with right upper lobectomy via VATS at [facility]; who presented to the hospital secondary to worsening shortness of breath, fever, general malaise and decreased appetite.  Patient was seen 2 days ago in the emergency department secondary to similar symptoms (less severe at that time) and was diagnosed with COVID-19; during that visit patient didn't has hypoxia and was felt to be adequate to go home with supportive management and isolation.  48 hours later patient continue experiencing high-grade temperatures, chills, decreased appetite, generalized weakness/fatigue, body aches, anosmia, ageusia and worsening breathing.  She returned to the emergency department for further evaluation and management, and was found to be hypoxic in the mid 80s on room air.    Patient denies chest pain, dysuria, hematuria, headaches, focal weakness, abdominal pain, nausea, vomiting, melena, hematochezia, blurred vision or any other complaints.  In the ED chest x-ray demonstrated multifocal infiltrates affecting left upper/mid lobes and also right lower lobes.  Normal lactic acid, ferritin level 248, Fibrinogen 601, CRP 30, negative troponin, d-dimer 0.72; CBGs in the 306 range, normal LFTs and normal renal function.  WBC is 10.7.  TRH contacted to admit patient for acute respiratory failure with hypoxia and meeting sepsis criteria due to COVID-19 infection. IVF's started, steroids given.   Past Medical/Surgical History: Past Medical History:  Diagnosis Date    Anxiety    Diabetes mellitus without complication ([place])    Hypertension    Lung cancer, upper lobe ([place]) 09/2015   Non-small cell in right upper lobe treated with RULectomy by VATS at [facility]    Past Surgical History:  Procedure Laterality Date   ABDOMINAL HYSTERECTOMY     BREAST BIOPSY Right 02/10/2018   Biopsy of upper outer quadrant of right breast due to calcifications seen on mammography revealed fat necrosis with calcifications.  Benign so continue annual screening mammograms.   BREAST SURGERY     CESAREAN SECTION     LOBECTOMY Right 10/06/2015   [place] lung cancer treated with VATS right upper lobectomy by Dr. [name] at [facility].    Social History:  reports that she has never smoked. She has never used smokeless tobacco. She reports that she does not drink alcohol or use drugs.  Allergies: No Known Allergies  Family History:  Family History  Problem Relation Age of Onset   Diabetes Mother    Hypertension Mother    Stroke Father    Breast cancer Neg Hx     Prior to Admission medications   Medication Sig Start Date End Date Taking? Authorizing Provider  acetaminophen (TYLENOL) 500 MG tablet Take 1,000 mg by mouth every 6 (six) hours as needed for mild pain or moderate pain.   Yes [provider]  albuterol (VENTOLIN HFA) 108 (90 Base) MCG/ACT inhaler Inhale 2 puffs into the lungs every 4 (four) hours as needed for wheezing or shortness of breath.   Yes [provider]  carvedilol (COREG) 25 MG tablet Take 1.5 tablets (37.5 mg total) by mouth 2 (two) times daily. 05/14/17  Yes Nahser, Wonda Cheng, MD  fluticasone Asencion Islam)  50 MCG/ACT nasal spray Place 2 sprays into both nostrils daily. 02/08/18  Yes McVey, Gelene Mink, PA-C  furosemide (LASIX) 40 MG tablet Take 1 tablet (40 mg total) by mouth daily. 05/06/17  Yes Nahser, Wonda Cheng, MD  sacubitril-valsartan (ENTRESTO) 49-51 MG Take 1 tablet by mouth 2 (two) times daily. 05/14/17  Yes Nahser, Wonda Cheng, MD    sitaGLIPtin-metformin (JANUMET) 50-1000 MG tablet Take 1 tablet 2 (two) times daily with a meal by mouth. 12/15/16  Yes Shawnee Knapp, MD  ALPRAZolam Duanne Moron) 0.5 MG tablet Take 1 tablet (0.5 mg total) by mouth at bedtime as needed for anxiety. Patient not taking: Reported on 07/09/2018 04/24/18   Forrest Moron, MD  amLODipine (NORVASC) 5 MG tablet Take 1 tablet (5 mg total) by mouth daily. Patient not taking: Reported on 07/09/2018 05/03/17   Nahser, Wonda Cheng, MD  cephALEXin (KEFLEX) 500 MG capsule Take 1 capsule (500 mg total) by mouth 4 (four) times daily. Patient not taking: Reported on 07/09/2018 07/08/18   Lily Kocher, PA-C  escitalopram (LEXAPRO) 10 MG tablet Take 1 tablet (10 mg total) by mouth daily. If needed, increase dose to 20mg /day after 2 weeks. Patient not taking: Reported on 07/09/2018 02/08/18   McVey, Gelene Mink, PA-C  HYDROcodone-acetaminophen (NORCO/VICODIN) 5-325 MG tablet Take 1 tablet by mouth every 4 (four) hours as needed (for headache or severe pain.). 07/08/18   Lily Kocher, PA-C  loratadine-pseudoephedrine (CLARITIN-D 12 HOUR) 5-120 MG tablet Take 1 tablet by mouth 2 (two) times daily. 02/08/18   McVey, Gelene Mink, PA-C  potassium chloride SA (K-DUR,KLOR-CON) 20 MEQ tablet Take 1 tablet (20 mEq total) by mouth daily. 05/06/17   Nahser, Wonda Cheng, MD  triamcinolone cream (KENALOG) 0.1 % Apply 1 application topically 2 (two) times daily. 06/04/17   Jaynee Eagles, PA-C    Review of Systems:  Negative except as otherwise mentioned in HPI.  Physical Exam: Vitals:   07/09/18 1030 07/09/18 1100 07/09/18 1130 07/09/18 1139  BP: (!) 169/110 (!) 170/113 140/89   Pulse:      Resp: (!) 38 (!) 38 (!) 27 (!) 25  Temp:      TempSrc:      SpO2: 94% 95%  96%  Weight:      Height:        Constitutional: Well-developed, warm to touch, having mild difficulty speaking in full sentences and requiring oxygen supplementation. Eyes: PERRL, lids and conjunctivae normal, no  icterus, no nystagmus. ENMT: Mucous membranes are moist. Posterior pharynx clear of any exudate or lesions. Neck: normal, supple, no masses, no thyromegaly, no JVD. Respiratory: Diffuse rhonchi right, no wheezing, no crackles.  Positive tachypnea.  No using accessory muscle.  Nasal cannula supplementation in place.   Cardiovascular: Tachycardia, no rubs, no gallops, no murmurs. No extremity edema.  Abdomen: no tenderness, no masses palpated. No hepatosplenomegaly. Bowel sounds positive.  Musculoskeletal: no clubbing / cyanosis. No joint deformity upper and lower extremities. Good ROM, no contractures. Normal muscle tone.  Skin: no rashes, lesions, ulcers. No induration Neurologic: CN 2-12 grossly intact. Sensation intact, DTR normal. Strength 5/5 in all 4.  Psychiatric: Normal judgment and insight. Alert and oriented x 3. Normal mood.    Labs on Admission: I have personally reviewed the following labs and imaging studies  CBC: Recent Labs  Lab 07/07/18 2133 07/09/18 1015  WBC 6.7 10.7*  NEUTROABS 5.1 9.8*  HGB 13.7 13.3  HCT 41.6 41.4  MCV 84.2 86.3  PLT 192 235  Basic Metabolic Panel: Recent Labs  Lab 07/07/18 2133 07/09/18 1015  NA 132* 138  K 3.8 4.2  CL 97* 102  CO2 19* 18*  GLUCOSE 315* 306*  BUN 12 12  CREATININE 0.75 0.81  CALCIUM 8.5* 9.0   GFR: Estimated Creatinine Clearance: 78.9 mL/min (by C-G formula based on SCr of 0.81 mg/dL).   Liver Function Tests: Recent Labs  Lab 07/07/18 2133 07/09/18 1015  AST 32 50*  ALT 34 42  ALKPHOS 103 122  BILITOT 0.6 1.1  PROT 7.6 8.0  ALBUMIN 3.4* 3.4*   Cardiac Enzymes: Recent Labs  Lab 07/09/18 1015  TROPONINI <0.03   CBG: Recent Labs  Lab 07/08/18 0006  GLUCAP 288*   Lipid Profile: Recent Labs    07/09/18 1015  TRIG 171*   Anemia Panel: Recent Labs    07/09/18 1015  FERRITIN 248   Urine analysis:    Component Value Date/Time   COLORURINE YELLOW 07/08/2018 0015   APPEARANCEUR HAZY (A)  07/08/2018 0015   LABSPEC 1.035 (H) 07/08/2018 0015   PHURINE 5.0 07/08/2018 0015   GLUCOSEU >=500 (A) 07/08/2018 0015   HGBUR NEGATIVE 07/08/2018 0015   BILIRUBINUR NEGATIVE 07/08/2018 0015   BILIRUBINUR negative 01/05/2017 0906   KETONESUR 20 (A) 07/08/2018 0015   PROTEINUR 30 (A) 07/08/2018 0015   UROBILINOGEN 0.2 01/05/2017 0906   NITRITE NEGATIVE 07/08/2018 0015   LEUKOCYTESUR MODERATE (A) 07/08/2018 0015    Recent Results (from the past 240 hour(s))  SARS Coronavirus 2 (CEPHEID- Performed in [place] hospital lab), Hosp Order     Status: Abnormal   Collection Time: 07/07/18  9:10 PM  Result Value Ref Range Status   SARS Coronavirus 2 POSITIVE (A) NEGATIVE Final    Comment: RESULT CALLED TO, READ BACK BY AND VERIFIED WITH: H BRYANT,PA @2258  07/07/18 MKELLY (NOTE) If result is NEGATIVE SARS-CoV-2 target nucleic acids are NOT DETECTED. The SARS-CoV-2 RNA is generally detectable in upper and lower  respiratory specimens during the acute phase of infection. The lowest  concentration of SARS-CoV-2 viral copies this assay can detect is 250  copies / mL. A negative result does not preclude SARS-CoV-2 infection  and should not be used as the sole basis for treatment or other  patient management decisions.  A negative result may occur with  improper specimen collection / handling, submission of specimen other  than nasopharyngeal swab, presence of viral mutation(s) within the  areas targeted by this assay, and inadequate number of viral copies  (<250 copies / mL). A negative result must be combined with clinical  observations, patient history, and epidemiological information. If result is POSITIVE SARS-CoV-2 target nucleic acids are DETECTED. The  SARS-CoV-2 RNA is generally detectable in upper and lower  respiratory specimens during the acute phase of infection.  Positive  results are indicative of active infection with SARS-CoV-2.  Clinical  correlation with patient history  and other diagnostic information is  necessary to determine patient infection status.  Positive results do  not rule out bacterial infection or co-infection with other viruses. If result is PRESUMPTIVE POSTIVE SARS-CoV-2 nucleic acids MAY BE PRESENT.   A presumptive positive result was obtained on the submitted specimen  and confirmed on repeat testing.  While 2019 novel coronavirus  (SARS-CoV-2) nucleic acids may be present in the submitted sample  additional confirmatory testing may be necessary for epidemiological  and / or clinical management purposes  to differentiate between  SARS-CoV-2 and other Sarbecovirus currently known to infect humans.  If clinically indicated additional testing with an alternate test  methodology [phone]) is a dvised. The SARS-CoV-2 RNA is generally  detectable in upper and lower respiratory specimens during the acute  phase of infection. The expected result is Negative. Fact Sheet for Patients:  StrictlyIdeas.no Fact Sheet for Healthcare Providers: BankingDealers.co.za This test is not yet approved or cleared by the [place] FDA and has been authorized for detection and/or diagnosis of SARS-CoV-2 by FDA under an Emergency Use Authorization (EUA).  This EUA will remain in effect (meaning this test can be used) for the duration of the COVID-19 declaration under Section 564(b)(1) of the Act, 21 U.S.C. section 360bbb-3(b)(1), unless the authorization is terminated or revoked sooner. Performed at [facility], [address], [place], [place] [address]   Culture, blood (routine x 2)     Status: None (Preliminary result)   Collection Time: 07/07/18  9:34 PM  Result Value Ref Range Status   Specimen Description BLOOD LEFT ANTECUBITAL  Final   Special Requests   Final    BOTTLES DRAWN AEROBIC AND ANAEROBIC Blood Culture adequate volume   Culture   Final    NO GROWTH 2 DAYS Performed at [facility],  [address], [place], [place] [address]    Report Status PENDING  Incomplete  Culture, blood (routine x 2)     Status: None (Preliminary result)   Collection Time: 07/07/18  9:35 PM  Result Value Ref Range Status   Specimen Description BLOOD RIGHT ANTECUBITAL  Final   Special Requests   Final    BOTTLES DRAWN AEROBIC AND ANAEROBIC Blood Culture adequate volume   Culture   Final    NO GROWTH 2 DAYS Performed at [facility], [address], [place], [place] [address]    Report Status PENDING  Incomplete  Urine Culture     Status: Abnormal   Collection Time: 07/08/18 12:36 AM  Result Value Ref Range Status   Specimen Description   Final    URINE, CLEAN CATCH Performed at [facility], [address], [place], [place] [address]    Special Requests   Final    Normal Performed at [facility], [address] [place], [place] [address]    Culture (A)  Final    70,000 COLONIES/mL LACTOBACILLUS SPECIES Standardized susceptibility testing for this organism is not available. Performed at [facility], [place] [address] [place], [place] [address]    Report Status 07/09/2018 FINAL  Final  Blood Culture (routine x 2)     Status: None (Preliminary result)   Collection Time: 07/09/18 10:00 AM  Result Value Ref Range Status   Specimen Description BLOOD RIGHT ARM DRAWN BY RN  Final   Special Requests   Final    BOTTLES DRAWN AEROBIC AND ANAEROBIC Blood Culture adequate volume Performed at [facility], [address], [place], [place] [address]    Culture PENDING  Incomplete   Report Status PENDING  Incomplete  Blood Culture (routine x 2)     Status: None (Preliminary result)   Collection Time: 07/09/18 10:10 AM  Result Value Ref Range Status   Specimen Description BLOOD LEFT ARM DRAWN BY RN  Final   Special Requests   Final    BOTTLES DRAWN AEROBIC AND ANAEROBIC Blood Culture results may not be optimal due to an inadequate volume of blood received in culture bottles Performed at [facility], [address], [place], [place] [address]    Culture PENDING  Incomplete  Report Status PENDING  Incomplete     Radiological Exams on Admission: Dg Chest Port 1 View  Result Date: 07/09/2018 CLINICAL DATA:  Shortness of breath. History of lung carcinoma. Positive test for COVID-19 EXAM: PORTABLE CHEST 1 VIEW COMPARISON:  July 07, 2018 FINDINGS: There is airspace consolidation in the right lower lobe. There is more ill-defined airspace opacity in the left upper lobe and left base regions. Heart size and pulmonary vascularity are normal. No adenopathy. No bone lesions. IMPRESSION: Multifocal pneumonia with ground-glass type airspace opacity throughout much of the left lung. There is consolidation as well as airspace opacity in the right lower lung region. Stable cardiac silhouette.  No evident adenopathy. Electronically Signed   By: Lowella Grip III M.D.   On: 07/09/2018 10:02   Dg Chest Portable 1 View  Result Date: 07/07/2018 CLINICAL DATA:  48 year old female with headache and fever. History of right lung cancer and prior wedge resection. Testing for COVID-19 pending. EXAM: PORTABLE CHEST 1 VIEW COMPARISON:  04/26/2018 and earlier. FINDINGS: New patchy and indistinct bilateral lower lung and peripheral pulmonary opacities. Similar lung volumes. Mediastinal contours remain normal. Visualized tracheal air column is within normal limits. No pneumothorax, pulmonary edema or pleural effusion. Paucity of bowel gas in the upper abdomen. No osseous abnormality identified. IMPRESSION: Patchy and indistinct bilateral lower lung opacity suspicious for acute viral/atypical respiratory infection. No pleural effusion. Electronically Signed   By: Genevie Ann M.D.   On: 07/07/2018 21:40    EKG: Independently reviewed.  Cardiac, LVH by voltage; left bundle branch block.  No acute ischemic changes.  Assessment/Plan 1-acute respiratory failure with hypoxia due to COVID-19 virus -Patient will be admitted to  [facility] -started on steroids and will need Remdesivir (not available at [facility]) -admission protocol use, procalcitonin was not significantly elevated, will hold on antibiotics for now. -given criteria of elevated CRP, hypoxia and changes in X-ray, might benefit for the use of Actemra as well. -continue supportive care and O2 supplementation.  2-sepsis due to COVID-19 -judicious IVF overnight -continue supportive care -follow clinical response  -patient admitted to progressive unit  3-type 2 diabetes with hyperglycemia -uncontrolled with A1C 10.8 in 2017 and CBG in the 300 range on admission -repeat A1C -hold oral hypoglycemic agents -start SSI and levemir  4-chronic systolic HF -compensated currently -most recent EF 40% -continue coreg -holding entresto and diuretics on admission -gentle IVF's for 24 hours. -Follow daily weights, strict intake and output and low-sodium diet.  5-HTN -will continue coreg -use PRN hydralazine -holding entresto and lasix for now  6-GERD/GI prophylaxis -will use protonix.   DVT prophylaxis: lovenox   Code Status: Full code Family Communication: No family at bedside. Disposition Plan: Anticipate discharge back home once medically stable. Consults called: None Admission status: Inpatient, admit to [place], progressive unit, length of stay more than 2 midnights.   Time Spent: 70 minutes  Barton Dubois MD [facility] Pager [phone]  07/09/2018, 11:51 AM

## 2018-07-09 NOTE — Progress Notes (Signed)
Pharmacy Brief Note  O:  ALT: 42 CXR: multifocal PNA SpO2: 97% on 3L [place]  A/P:  Patient meets criteria for remdesivir use. Will initiate remdesivir 200 mg iv once followed by 100 mg iv daily x 4 days.   Ulice Dash, PharmD Clinical Pharmacist

## 2018-07-09 NOTE — ED Triage Notes (Signed)
Pt tested + for covid 19 x 2 days ago.  Reports increased sob since that time.

## 2018-07-10 ENCOUNTER — Telehealth: Payer: Self-pay | Admitting: *Deleted

## 2018-07-10 DIAGNOSIS — R739 Hyperglycemia, unspecified: Secondary | ICD-10-CM

## 2018-07-10 DIAGNOSIS — U071 COVID-19: Secondary | ICD-10-CM

## 2018-07-10 DIAGNOSIS — J9601 Acute respiratory failure with hypoxia: Secondary | ICD-10-CM

## 2018-07-10 DIAGNOSIS — I10 Essential (primary) hypertension: Secondary | ICD-10-CM

## 2018-07-10 DIAGNOSIS — J069 Acute upper respiratory infection, unspecified: Secondary | ICD-10-CM

## 2018-07-10 DIAGNOSIS — I5022 Chronic systolic (congestive) heart failure: Secondary | ICD-10-CM

## 2018-07-10 LAB — HIV ANTIBODY (ROUTINE TESTING W REFLEX): HIV Screen 4th Generation wRfx: NONREACTIVE

## 2018-07-10 LAB — COMPREHENSIVE METABOLIC PANEL
ALT: 32 U/L (ref 0–44)
AST: 31 U/L (ref 15–41)
Albumin: 2.9 g/dL — ABNORMAL LOW (ref 3.5–5.0)
Alkaline Phosphatase: 97 U/L (ref 38–126)
Anion gap: 12 (ref 5–15)
BUN: 22 mg/dL — ABNORMAL HIGH (ref 6–20)
CO2: 20 mmol/L — ABNORMAL LOW (ref 22–32)
Calcium: 8.7 mg/dL — ABNORMAL LOW (ref 8.9–10.3)
Chloride: 102 mmol/L (ref 98–111)
Creatinine, Ser: 0.82 mg/dL (ref 0.44–1.00)
GFR calc Af Amer: >60 mL/min (ref >60)
GFR calc non Af Amer: >60 mL/min (ref >60)
Glucose, Bld: 315 mg/dL — ABNORMAL HIGH (ref 70–99)
Potassium: 4.4 mmol/L (ref 3.5–5.1)
Sodium: 134 mmol/L — ABNORMAL LOW (ref 135–145)
Total Bilirubin: 0.8 mg/dL (ref 0.3–1.2)
Total Protein: 7.3 g/dL (ref 6.5–8.1)

## 2018-07-10 LAB — GLUCOSE, CAPILLARY
Glucose-Capillary: 297 mg/dL — ABNORMAL HIGH (ref 70–99)
Glucose-Capillary: 329 mg/dL — ABNORMAL HIGH (ref 70–99)
Glucose-Capillary: 285 mg/dL — ABNORMAL HIGH (ref 70–99)
Glucose-Capillary: 284 mg/dL — ABNORMAL HIGH (ref 70–99)
Glucose-Capillary: 282 mg/dL — ABNORMAL HIGH (ref 70–99)

## 2018-07-10 LAB — C-REACTIVE PROTEIN: CRP: 30 mg/dL — ABNORMAL HIGH (ref <1.0)

## 2018-07-10 LAB — CBC
HCT: 35.3 % — ABNORMAL LOW (ref 36.0–46.0)
Hemoglobin: 11.6 g/dL — ABNORMAL LOW (ref 12.0–15.0)
MCH: 28 pg (ref 26.0–34.0)
MCHC: 32.9 g/dL (ref 30.0–36.0)
MCV: 85.3 fL (ref 80.0–100.0)
Platelets: 220 10*3/uL (ref 150–400)
RBC: 4.14 MIL/uL (ref 3.87–5.11)
RDW: 12.7 % (ref 11.5–15.5)
WBC: 8.6 10*3/uL (ref 4.0–10.5)
nRBC: 0 % (ref 0.0–0.2)

## 2018-07-10 LAB — MAGNESIUM: Magnesium: 2.4 mg/dL (ref 1.7–2.4)

## 2018-07-10 LAB — D-DIMER, QUANTITATIVE: D-Dimer, Quant: 0.67 ug/mL-FEU — ABNORMAL HIGH (ref 0.00–0.50)

## 2018-07-10 LAB — LACTATE DEHYDROGENASE: LDH: 311 U/L — ABNORMAL HIGH (ref 98–192)

## 2018-07-10 LAB — PHOSPHORUS: Phosphorus: 3.7 mg/dL (ref 2.5–4.6)

## 2018-07-10 LAB — FERRITIN: Ferritin: 308 ng/mL — ABNORMAL HIGH (ref 11–307)

## 2018-07-10 MED ORDER — ENOXAPARIN SODIUM 40 MG/0.4ML ~~LOC~~ SOLN: 40.0000 mg | Freq: Two times a day (BID) | SUBCUTANEOUS | Status: DC

## 2018-07-10 MED ORDER — INSULIN ASPART 100 UNIT/ML ~~LOC~~ SOLN
4.0000 [IU] | Freq: Three times a day (TID) | SUBCUTANEOUS | Status: DC
  Administered 2018-07-10 – 2018-07-15 (×16): 4 [IU] via SUBCUTANEOUS

## 2018-07-10 NOTE — TOC Progression Note (Addendum)
Transition of Care [facility]) - Progression Note    Patient Details  Name: Crystal Bryant MRN: [id number] Date of Birth: Aug 21, 1970  Transition of Care [facility]) CM/SW [place], RN Phone Number: 07/10/2018, 2:53 PM  Clinical Narrative:    Patient remains acutely ill; continues to feel poorly and dyspneic with minimal activity. HH resumption orders/F2F needed prior to discharge for arrangements of [facility] services. CM team will continue to follow.

## 2018-07-10 NOTE — Progress Notes (Signed)
PROGRESS NOTE  Crystal Bryant IRS:[id number] DOB: 1970/10/08 DOA: 07/09/2018 PCP: Shawnee Knapp, MD   LOS: 1 day   Brief Narrative / Interim history: 48 year old female with history of chronic systolic CHF with most recent EF of 40%, non-small cell lung cancer status post lobectomy and VATS at [facility] in 2018, hypertension, type 2 diabetes mellitus, who comes to the hospital with chief complaint of shortness of breath, fever, general malaise and decreased appetite.  Patient was initially evaluated on 07/07/2018 in the ED for similar symptoms, was positive for COVID-19, but was not hypoxic and discharged home.  She returned and was admitted on 6/10 due to persistent and worsening symptoms.  She was found to be hypoxic, chest x-ray on admission showed multifocal pneumonia and was admitted to the hospital.  Subjective: Continues to feel poorly this morning, weak, fatigued, has shortness of breath slightly better at rest but still quite dyspneic with minimal activity  Assessment & Plan: Active Problems:   Essential hypertension   History of malignant neoplasm of upper lobe bronchus or lung   Acute respiratory disease due to COVID-19 virus   Chronic systolic HF (heart failure) (HCC)   Principal Problem Acute Hypoxic Respiratory Failure due to Covid-19 Viral Illness, viral pneumonia, sepsis -Patient was admitted to the hospital with hypoxic respiratory failure in the setting of COVID-19 viral pneumonia. -Had elevated inflammatory markers, she was started on Solu-Medrol as well as Remdesivir on 6/10, continue, closely monitor -Chest x-ray personally reviewed, mild right lower lobe consolidation but most infiltrates appear on the left  The treatment plan and use of medications and known side effects were discussed with patient/family, they were clearly explained that there is no proven definitive treatment for COVID-19 infection, any medications used here are based on published clinical  articles/anecdotal data which are not peer-reviewed or randomized control trials.  Complete risks and long-term side effects are unknown, however in the best clinical judgment they seem to be of some clinical benefit rather than medical risks.  Patient agree with the treatment plan and want to receive the given medications.  COVID-19 Labs  Recent Labs    07/07/18 2133 07/09/18 1015 07/10/18 0300  DDIMER 0.48 0.72* 0.67*  FERRITIN  --  248 308*  LDH  --  339* 311*  CRP  --  30.0* 30.0*    Lab Results  Component Value Date   SARSCOV2NAA POSITIVE (A) 07/07/2018   Active Problems Chronic systolic CHF -She appears euvolemic, discontinue IV fluids.  Probably resume Lasix next day or 2. -Hold Entresto for now, continue Coreg  Type 2 diabetes mellitus -Patient was placed on detemir as well as sliding scale, she was persistently hyperglycemic and have added NovoLog 4 units scheduled today.  Anticipated once steroids will be discontinued a day 3 her insulin requirements will go down  Hypertension -Continue Coreg, she is normotensive  History of lung cancer -Monitor as an outpatient  Anxiety -Continue home Xanax   Scheduled Meds: . carvedilol  25 mg Oral BID  . enoxaparin (LOVENOX) injection  40 mg Subcutaneous Q24H  . insulin aspart  0-15 Units Subcutaneous TID AC & HS  . insulin aspart  4 Units Subcutaneous TID WC  . insulin detemir  12 Units Subcutaneous QHS  . methylPREDNISolone (SOLU-MEDROL) injection  40 mg Intravenous Q12H  . pantoprazole  40 mg Oral Daily   Continuous Infusions: . remdesivir 100 mg in NS 250 mL     PRN Meds:.acetaminophen, albuterol, ALPRAZolam, hydrALAZINE, ondansetron **OR** ondansetron (ZOFRAN) IV  DVT prophylaxis: Lovenox Code Status: Full code Family Communication: d/w patient Disposition Plan: home when ready   Consultants:   None   Procedures:   None   Antimicrobials:  Remdesevir 6/10 >>   Objective: Vitals:   07/10/18 0007  07/10/18 0418 07/10/18 0500 07/10/18 0800  BP: 118/86 120/85    Pulse: 84 87  86  Resp: (!) 33 (!) 28  (!) 40  Temp:  98.8 F (37.1 C)    TempSrc:  Oral    SpO2: 95% 92%  98%  Weight:   61.7 kg   Height:        Intake/Output Summary (Last 24 hours) at 07/10/2018 1132 Last data filed at 07/10/2018 0900 Gross per 24 hour  Intake 550 ml  Output -  Net 550 ml   Filed Weights   07/09/18 0926 07/09/18 1727 07/10/18 0500  Weight: 68 kg 68 kg 61.7 kg    Examination:  Constitutional: Sitting in bed, no significant distress Eyes: PERRL, lids and conjunctivae normal ENMT: Mucous membranes are moist Neck: normal, supple Respiratory: Bibasilar rhonchi, no wheezing or crackles.  Increased respiratory effort, tachypneic Cardiovascular: Regular rate and rhythm, no murmurs.  No peripheral edema. Abdomen: Soft, NT, ND, BS + Musculoskeletal: no clubbing / cyanosis. Skin: No rashes seen Neurologic: No focal deficits, equal strength Psychiatric: Normal judgment and insight. Alert and oriented x 3. Normal mood.    Data Reviewed: I have independently reviewed following labs and imaging studies   CBC: Recent Labs  Lab 07/07/18 2133 07/09/18 1015 07/10/18 0300  WBC 6.7 10.7* 8.6  NEUTROABS 5.1 9.8*  --   HGB 13.7 13.3 11.6*  HCT 41.6 41.4 35.3*  MCV 84.2 86.3 85.3  PLT 192 235 841   Basic Metabolic Panel: Recent Labs  Lab 07/07/18 2133 07/09/18 1015 07/10/18 0300  NA 132* 138 134*  K 3.8 4.2 4.4  CL 97* 102 102  CO2 19* 18* 20*  GLUCOSE 315* 306* 315*  BUN 12 12 22*  CREATININE 0.75 0.81 0.82  CALCIUM 8.5* 9.0 8.7*  MG  --   --  2.4  PHOS  --   --  3.7   GFR: Estimated Creatinine Clearance: 78 mL/min (by C-G formula based on SCr of 0.82 mg/dL). Liver Function Tests: Recent Labs  Lab 07/07/18 2133 07/09/18 1015 07/10/18 0300  AST 32 50* 31  ALT 34 42 32  ALKPHOS 103 122 97  BILITOT 0.6 1.1 0.8  PROT 7.6 8.0 7.3  ALBUMIN 3.4* 3.4* 2.9*   No results for  input(s): LIPASE, AMYLASE in the last 168 hours. No results for input(s): AMMONIA in the last 168 hours. Coagulation Profile: No results for input(s): INR, PROTIME in the last 168 hours. Cardiac Enzymes: Recent Labs  Lab 07/09/18 1015  TROPONINI <0.03   BNP (last 3 results) No results for input(s): PROBNP in the last 8760 hours. HbA1C: No results for input(s): HGBA1C in the last 72 hours. CBG: Recent Labs  Lab 07/08/18 0006 07/09/18 1759 07/09/18 2114 07/10/18 0737  GLUCAP 288* 305* 296* 297*   Lipid Profile: Recent Labs    07/09/18 1015  TRIG 171*   Thyroid Function Tests: No results for input(s): TSH, T4TOTAL, FREET4, T3FREE, THYROIDAB in the last 72 hours. Anemia Panel: Recent Labs    07/09/18 1015 07/10/18 0300  FERRITIN 248 308*   Urine analysis:    Component Value Date/Time   COLORURINE YELLOW 07/08/2018 0015   APPEARANCEUR HAZY (A) 07/08/2018 0015   LABSPEC 1.035 (  H) 07/08/2018 0015   PHURINE 5.0 07/08/2018 0015   GLUCOSEU >=500 (A) 07/08/2018 0015   HGBUR NEGATIVE 07/08/2018 0015   BILIRUBINUR NEGATIVE 07/08/2018 0015   BILIRUBINUR negative 01/05/2017 0906   KETONESUR 20 (A) 07/08/2018 0015   PROTEINUR 30 (A) 07/08/2018 0015   UROBILINOGEN 0.2 01/05/2017 0906   NITRITE NEGATIVE 07/08/2018 0015   LEUKOCYTESUR MODERATE (A) 07/08/2018 0015   Sepsis Labs: Invalid input(s): PROCALCITONIN, LACTICIDVEN  Recent Results (from the past 240 hour(s))  SARS Coronavirus 2 (CEPHEID- Performed in [place] hospital lab), Hosp Order     Status: Abnormal   Collection Time: 07/07/18  9:10 PM   Specimen: Nasopharyngeal Swab  Result Value Ref Range Status   SARS Coronavirus 2 POSITIVE (A) NEGATIVE Final    Comment: RESULT CALLED TO, READ BACK BY AND VERIFIED WITH: H BRYANT,PA @2258  07/07/18 MKELLY (NOTE) If result is NEGATIVE SARS-CoV-2 target nucleic acids are NOT DETECTED. The SARS-CoV-2 RNA is generally detectable in upper and lower  respiratory specimens  during the acute phase of infection. The lowest  concentration of SARS-CoV-2 viral copies this assay can detect is 250  copies / mL. A negative result does not preclude SARS-CoV-2 infection  and should not be used as the sole basis for treatment or other  patient management decisions.  A negative result may occur with  improper specimen collection / handling, submission of specimen other  than nasopharyngeal swab, presence of viral mutation(s) within the  areas targeted by this assay, and inadequate number of viral copies  (<250 copies / mL). A negative result must be combined with clinical  observations, patient history, and epidemiological information. If result is POSITIVE SARS-CoV-2 target nucleic acids are DETECTED. The  SARS-CoV-2 RNA is generally detectable in upper and lower  respiratory specimens during the acute phase of infection.  Positive  results are indicative of active infection with SARS-CoV-2.  Clinical  correlation with patient history and other diagnostic information is  necessary to determine patient infection status.  Positive results do  not rule out bacterial infection or co-infection with other viruses. If result is PRESUMPTIVE POSTIVE SARS-CoV-2 nucleic acids MAY BE PRESENT.   A presumptive positive result was obtained on the submitted specimen  and confirmed on repeat testing.  While 2019 novel coronavirus  (SARS-CoV-2) nucleic acids may be present in the submitted sample  additional confirmatory testing may be necessary for epidemiological  and / or clinical management purposes  to differentiate between  SARS-CoV-2 and other Sarbecovirus currently known to infect humans.  If clinically indicated additional testing with an alternate test  methodology [phone]) is a dvised. The SARS-CoV-2 RNA is generally  detectable in upper and lower respiratory specimens during the acute  phase of infection. The expected result is Negative. Fact Sheet for Patients:   StrictlyIdeas.no Fact Sheet for Healthcare Providers: BankingDealers.co.za This test is not yet approved or cleared by the [place] FDA and has been authorized for detection and/or diagnosis of SARS-CoV-2 by FDA under an Emergency Use Authorization (EUA).  This EUA will remain in effect (meaning this test can be used) for the duration of the COVID-19 declaration under Section 564(b)(1) of the Act, 21 U.S.C. section 360bbb-3(b)(1), unless the authorization is terminated or revoked sooner. Performed at [facility], [address] [place], [place] [address]   Culture, blood (routine x 2)     Status: None (Preliminary result)   Collection Time: 07/07/18  9:34 PM   Specimen: BLOOD  Result Value Ref Range Status  Specimen Description BLOOD LEFT ANTECUBITAL  Final   Special Requests   Final    BOTTLES DRAWN AEROBIC AND ANAEROBIC Blood Culture adequate volume   Culture   Final    NO GROWTH 3 DAYS Performed at [facility], [address], [place], [place] [address]    Report Status PENDING  Incomplete  Culture, blood (routine x 2)     Status: None (Preliminary result)   Collection Time: 07/07/18  9:35 PM   Specimen: BLOOD  Result Value Ref Range Status   Specimen Description BLOOD RIGHT ANTECUBITAL  Final   Special Requests   Final    BOTTLES DRAWN AEROBIC AND ANAEROBIC Blood Culture adequate volume   Culture   Final    NO GROWTH 3 DAYS Performed at [facility], [address] [place], [place] [address]    Report Status PENDING  Incomplete  Urine Culture     Status: Abnormal   Collection Time: 07/08/18 12:36 AM   Specimen: Urine, Clean Catch  Result Value Ref Range Status   Specimen Description   Final    URINE, CLEAN CATCH Performed at [facility], [address], [place], [place] [address]    Special Requests   Final    Normal Performed at [facility], [address] [place], [place] [address]    Culture (A)  Final     70,000 COLONIES/mL LACTOBACILLUS SPECIES Standardized susceptibility testing for this organism is not available. Performed at [facility], [place] [address] [place], [place] [address]    Report Status 07/09/2018 FINAL  Final  Blood Culture (routine x 2)     Status: None (Preliminary result)   Collection Time: 07/09/18 10:00 AM   Specimen: BLOOD RIGHT ARM  Result Value Ref Range Status   Specimen Description BLOOD RIGHT ARM DRAWN BY RN  Final   Special Requests   Final    BOTTLES DRAWN AEROBIC AND ANAEROBIC Blood Culture adequate volume   Culture   Final    NO GROWTH < 24 HOURS Performed at [facility], [address], [place], [place] [address]    Report Status PENDING  Incomplete  Blood Culture (routine x 2)     Status: None (Preliminary result)   Collection Time: 07/09/18 10:10 AM   Specimen: BLOOD LEFT ARM  Result Value Ref Range Status   Specimen Description BLOOD LEFT ARM DRAWN BY RN  Final   Special Requests   Final    BOTTLES DRAWN AEROBIC AND ANAEROBIC Blood Culture results may not be optimal due to an inadequate volume of blood received in culture bottles   Culture   Final    NO GROWTH < 24 HOURS Performed at [facility], [address], [place], [place] [address]    Report Status PENDING  Incomplete      Radiology Studies: Dg Chest Port 1 View  Result Date: 07/09/2018 CLINICAL DATA:  Shortness of breath. History of lung carcinoma. Positive test for COVID-19 EXAM: PORTABLE CHEST 1 VIEW COMPARISON:  July 07, 2018 FINDINGS: There is airspace consolidation in the right lower lobe. There is more ill-defined airspace opacity in the left upper lobe and left base regions. Heart size and pulmonary vascularity are normal. No adenopathy. No bone lesions. IMPRESSION: Multifocal pneumonia with ground-glass type airspace opacity throughout much of the left lung. There is consolidation as well as airspace opacity in the right lower lung region. Stable cardiac silhouette.  No evident  adenopathy. Electronically Signed   By: Lowella Grip  III M.D.   On: 07/09/2018 10:02     Marzetta Board, MD, PhD [facility]  Contact via  [email or web address]  [place] P: [phone]  F: [phone]

## 2018-07-10 NOTE — Telephone Encounter (Signed)
Post ED Visit - Positive Culture Follow-up  Culture report reviewed by antimicrobial stewardship pharmacist: [place] Team []  Elenor Quinones, Pharm.D. []  Heide Guile, Pharm.D., BCPS AQ-ID []  Parks Neptune, Pharm.D., BCPS []  Alycia Rossetti, Pharm.D., BCPS []  [place], Pharm.D., BCPS, AAHIVP []  Legrand Como, Pharm.D., BCPS, AAHIVP []  Salome Arnt, PharmD, BCPS []  Johnnette Gourd, PharmD, BCPS []  Hughes Better, PharmD, BCPS []  Leeroy Cha, PharmD []  Laqueta Linden, PharmD, BCPS []  Albertina Parr, PharmD  [place] Team []  Leodis Sias, PharmD []  Lindell Spar, PharmD []  Royetta Asal, PharmD []  Graylin Shiver, Rph []  Rema Fendt) Glennon Mac, PharmD []  Arlyn Dunning, PharmD []  Netta Cedars, PharmD []  Dia Sitter, PharmD []  Leone Haven, PharmD []  Gretta Arab, PharmD []  Theodis Shove, PharmD []  Peggyann Juba, PharmD []  Reuel Boom, PharmD Isaias Sakai, PharmD  Positive urine culture Treated with Cephalexin, organism sensitive to the same and no further patient follow-up is required at this time.  Harlon Flor [facility] 07/10/2018, 9:12 AM

## 2018-07-11 LAB — COMPREHENSIVE METABOLIC PANEL
ALT: 41 U/L (ref 0–44)
AST: 65 U/L — ABNORMAL HIGH (ref 15–41)
Albumin: 2.8 g/dL — ABNORMAL LOW (ref 3.5–5.0)
Alkaline Phosphatase: 114 U/L (ref 38–126)
Anion gap: 12 (ref 5–15)
BUN: 32 mg/dL — ABNORMAL HIGH (ref 6–20)
CO2: 21 mmol/L — ABNORMAL LOW (ref 22–32)
Calcium: 9 mg/dL (ref 8.9–10.3)
Chloride: 103 mmol/L (ref 98–111)
Creatinine, Ser: 0.79 mg/dL (ref 0.44–1.00)
GFR calc Af Amer: >60 mL/min (ref >60)
GFR calc non Af Amer: >60 mL/min (ref >60)
Glucose, Bld: 306 mg/dL — ABNORMAL HIGH (ref 70–99)
Potassium: 4.8 mmol/L (ref 3.5–5.1)
Sodium: 136 mmol/L (ref 135–145)
Total Bilirubin: 0.4 mg/dL (ref 0.3–1.2)
Total Protein: 7.2 g/dL (ref 6.5–8.1)

## 2018-07-11 LAB — HEMOGLOBIN A1C
Hgb A1c MFr Bld: 13.7 % — ABNORMAL HIGH (ref 4.8–5.6)
Hgb A1c MFr Bld: 14.1 % — ABNORMAL HIGH (ref 4.8–5.6)
Mean Plasma Glucose: 346 mg/dL
Mean Plasma Glucose: 358 mg/dL

## 2018-07-11 LAB — CBC
HCT: 37.5 % (ref 36.0–46.0)
Hemoglobin: 12 g/dL (ref 12.0–15.0)
MCH: 27.3 pg (ref 26.0–34.0)
MCHC: 32 g/dL (ref 30.0–36.0)
MCV: 85.2 fL (ref 80.0–100.0)
Platelets: 272 10*3/uL (ref 150–400)
RBC: 4.4 MIL/uL (ref 3.87–5.11)
RDW: 12.7 % (ref 11.5–15.5)
WBC: 10.9 10*3/uL — ABNORMAL HIGH (ref 4.0–10.5)
nRBC: 0 % (ref 0.0–0.2)

## 2018-07-11 LAB — GLUCOSE, CAPILLARY
Glucose-Capillary: 340 mg/dL — ABNORMAL HIGH (ref 70–99)
Glucose-Capillary: 250 mg/dL — ABNORMAL HIGH (ref 70–99)
Glucose-Capillary: 303 mg/dL — ABNORMAL HIGH (ref 70–99)
Glucose-Capillary: 251 mg/dL — ABNORMAL HIGH (ref 70–99)

## 2018-07-11 LAB — D-DIMER, QUANTITATIVE: D-Dimer, Quant: 1.86 ug/mL-FEU — ABNORMAL HIGH (ref 0.00–0.50)

## 2018-07-11 LAB — FERRITIN: Ferritin: 360 ng/mL — ABNORMAL HIGH (ref 11–307)

## 2018-07-11 LAB — LACTATE DEHYDROGENASE: LDH: 358 U/L — ABNORMAL HIGH (ref 98–192)

## 2018-07-11 LAB — C-REACTIVE PROTEIN: CRP: 15.1 mg/dL — ABNORMAL HIGH (ref <1.0)

## 2018-07-11 MED ORDER — GUAIFENESIN-DM 100-10 MG/5ML PO SYRP
5.0000 mL | ORAL_SOLUTION | ORAL | Status: DC | PRN
  Administered 2018-07-11 – 2018-07-12 (×4): 5 mL via ORAL
  Filled 2018-07-11 (×4): qty 10

## 2018-07-11 MED ORDER — ENOXAPARIN SODIUM 40 MG/0.4ML ~~LOC~~ SOLN
40.0000 mg | SUBCUTANEOUS | Status: DC
  Administered 2018-07-11 – 2018-07-15 (×5): 40 mg via SUBCUTANEOUS
  Filled 2018-07-11 (×5): qty 0.4

## 2018-07-11 MED ORDER — OXYCODONE HCL 5 MG PO TABS
5.0000 mg | ORAL_TABLET | Freq: Four times a day (QID) | ORAL | Status: DC | PRN
  Administered 2018-07-11 – 2018-07-12 (×3): 5 mg via ORAL
  Filled 2018-07-11 (×3): qty 1

## 2018-07-11 MED ORDER — FUROSEMIDE 10 MG/ML IJ SOLN
40.0000 mg | Freq: Every day | INTRAMUSCULAR | Status: DC
  Administered 2018-07-11 – 2018-07-15 (×5): 40 mg via INTRAVENOUS
  Filled 2018-07-11 (×5): qty 4

## 2018-07-11 NOTE — Progress Notes (Signed)
Results for RUTHEL, MARTINE (MRN [id number]) as of 07/11/2018 11:04  Ref. Range 07/10/2018 11:58 07/10/2018 17:21 07/10/2018 20:28 07/10/2018 21:00 07/11/2018 07:49  Glucose-Capillary Latest Ref Range: 70 - 99 mg/dL 329 (H) 285 (H) 284 (H) 282 (H) 340 (H)  Noted that blood sugars continue to be greater than 200 mg/dl.  Recommend increasing Lantus to 12 units BID if blood sugars continue to be elevated. Titrate dosage as needed. Will continue to monitor blood sugars while in the hospital.  Noted that HgbA1C is 14.1%.  Harvel Ricks RN BSN CDE Diabetes Coordinator Pager: [phone]  8am-5pm

## 2018-07-11 NOTE — Progress Notes (Signed)
PROGRESS NOTE  Crystal Bryant KDT:[id number] DOB: 1970-04-12 DOA: 07/09/2018 PCP: Shawnee Knapp, MD   LOS: 2 days   Brief Narrative / Interim history: 48 year old female with history of chronic systolic CHF with most recent EF of 40%, non-small cell lung cancer status post lobectomy and VATS at [facility] in 2018, hypertension, type 2 diabetes mellitus, who comes to the hospital with chief complaint of shortness of breath, fever, general malaise and decreased appetite.  Patient was initially evaluated on 07/07/2018 in the ED for similar symptoms, was positive for COVID-19, but was not hypoxic and discharged home.  She returned and was admitted on 6/10 due to persistent and worsening symptoms.  She was found to be hypoxic, chest x-ray on admission showed multifocal pneumonia and was admitted to the hospital.  Subjective: Does not feel too good, weak, complains of every time she sits up she has a persistent cough which is associated with chest pain.  No abdominal pain, nausea or vomiting  Assessment & Plan: Active Problems:   Essential hypertension   History of malignant neoplasm of upper lobe bronchus or lung   Acute respiratory disease due to COVID-19 virus   Chronic systolic HF (heart failure) (HCC)   Principal Problem Acute Hypoxic Respiratory Failure due to Covid-19 Viral Illness, viral pneumonia, sepsis -Patient was admitted to the hospital with hypoxic respiratory failure in the setting of COVID-19 viral pneumonia. -Had elevated inflammatory markers, she was started on Solu-Medrol as well as Remdesivir on 6/10, continue, closely monitor -Stable, steroids to be finished tomorrow, Remdesivir in 2 days.  Maintains sats in the upper 90s on 2 L nasal cannula  The treatment plan and use of medications and known side effects were discussed with patient/family, they were clearly explained that there is no proven definitive treatment for COVID-19 infection, any medications used here are based on  published clinical articles/anecdotal data which are not peer-reviewed or randomized control trials.  Complete risks and long-term side effects are unknown, however in the best clinical judgment they seem to be of some clinical benefit rather than medical risks.  Patient agree with the treatment plan and want to receive the given medications.  COVID-19 Labs  Recent Labs    07/09/18 1015 07/10/18 0300 07/11/18 0400  DDIMER 0.72* 0.67* 1.86*  FERRITIN 248 308*  --   LDH 339* 311* 358*  CRP 30.0* 30.0* 15.1*    Lab Results  Component Value Date   SARSCOV2NAA POSITIVE (A) 07/07/2018   Active Problems Chronic systolic CHF -Continue Coreg, resume Lasix today.  She appears euvolemic.  Continue to hold Entresto  Type 2 diabetes mellitus -continues to be hyperglycemic today in the 300s range, will come off of steroids soon and CBGs will improve  Hypertension -Continue Coreg, she is normotensive  History of lung cancer -Monitor as an outpatient  Anxiety -Continue home Xanax   Scheduled Meds: . carvedilol  25 mg Oral BID  . insulin aspart  0-15 Units Subcutaneous TID AC & HS  . insulin aspart  4 Units Subcutaneous TID WC  . insulin detemir  12 Units Subcutaneous QHS  . methylPREDNISolone (SOLU-MEDROL) injection  40 mg Intravenous Q12H  . pantoprazole  40 mg Oral Daily   Continuous Infusions: . remdesivir 100 mg in NS 250 mL 100 mg (07/10/18 2040)   PRN Meds:.acetaminophen, albuterol, ALPRAZolam, hydrALAZINE, ondansetron **OR** ondansetron (ZOFRAN) IV  DVT prophylaxis: Lovenox Code Status: Full code Family Communication: d/w patient Disposition Plan: home when ready   Consultants:   None  Procedures:   None   Antimicrobials:  Remdesevir 6/10 >>   Objective: Vitals:   07/11/18 0456 07/11/18 0457 07/11/18 0500 07/11/18 0600  BP:      Pulse:   88 87  Resp:      Temp: 98.1 F (36.7 C)     TempSrc: Oral     SpO2:   94% 97%  Weight:  61.7 kg    Height:         Intake/Output Summary (Last 24 hours) at 07/11/2018 1962 Last data filed at 07/11/2018 0600 Gross per 24 hour  Intake 310 ml  Output -  Net 310 ml   Filed Weights   07/09/18 1727 07/10/18 0500 07/11/18 0457  Weight: 68 kg 61.7 kg 61.7 kg    Examination:  Constitutional: Laying on her side, appears uncomfortable Eyes: No scleral icterus ENMT: Moist mucous membranes Neck: normal, supple Respiratory: Bibasilar rhonchi without wheezing or crackles heard.  Shallow breathing Cardiovascular: Regular rate and rhythm, no murmurs appreciated.  No peripheral edema Abdomen: Soft, nontender, nondistended, positive bowel sounds Musculoskeletal: no clubbing / cyanosis. Skin: No new rashes Neurologic: No new focal deficits Psychiatric: Alert and oriented x3   Data Reviewed: I have independently reviewed following labs and imaging studies   CBC: Recent Labs  Lab 07/07/18 2133 07/09/18 1015 07/10/18 0300 07/11/18 0400  WBC 6.7 10.7* 8.6 10.9*  NEUTROABS 5.1 9.8*  --   --   HGB 13.7 13.3 11.6* 12.0  HCT 41.6 41.4 35.3* 37.5  MCV 84.2 86.3 85.3 85.2  PLT 192 235 220 229   Basic Metabolic Panel: Recent Labs  Lab 07/07/18 2133 07/09/18 1015 07/10/18 0300 07/11/18 0400  NA 132* 138 134* 136  K 3.8 4.2 4.4 4.8  CL 97* 102 102 103  CO2 19* 18* 20* 21*  GLUCOSE 315* 306* 315* 306*  BUN 12 12 22* 32*  CREATININE 0.75 0.81 0.82 0.79  CALCIUM 8.5* 9.0 8.7* 9.0  MG  --   --  2.4  --   PHOS  --   --  3.7  --    GFR: Estimated Creatinine Clearance: 79.9 mL/min (by C-G formula based on SCr of 0.79 mg/dL). Liver Function Tests: Recent Labs  Lab 07/07/18 2133 07/09/18 1015 07/10/18 0300 07/11/18 0400  AST 32 50* 31 65*  ALT 34 42 32 41  ALKPHOS 103 122 97 114  BILITOT 0.6 1.1 0.8 0.4  PROT 7.6 8.0 7.3 7.2  ALBUMIN 3.4* 3.4* 2.9* 2.8*   No results for input(s): LIPASE, AMYLASE in the last 168 hours. No results for input(s): AMMONIA in the last 168 hours. Coagulation  Profile: No results for input(s): INR, PROTIME in the last 168 hours. Cardiac Enzymes: Recent Labs  Lab 07/09/18 1015  TROPONINI <0.03   BNP (last 3 results) No results for input(s): PROBNP in the last 8760 hours. HbA1C: Recent Labs    07/09/18 1015 07/10/18 0300  HGBA1C 13.7* 14.1*   CBG: Recent Labs  Lab 07/10/18 0737 07/10/18 1158 07/10/18 1721 07/10/18 2028 07/10/18 2100  GLUCAP 297* 329* 285* 284* 282*   Lipid Profile: Recent Labs    07/09/18 1015  TRIG 171*   Thyroid Function Tests: No results for input(s): TSH, T4TOTAL, FREET4, T3FREE, THYROIDAB in the last 72 hours. Anemia Panel: Recent Labs    07/09/18 1015 07/10/18 0300  FERRITIN 248 308*   Urine analysis:    Component Value Date/Time   COLORURINE YELLOW 07/08/2018 0015   APPEARANCEUR HAZY (A) 07/08/2018 0015  LABSPEC 1.035 (H) 07/08/2018 0015   PHURINE 5.0 07/08/2018 0015   GLUCOSEU >=500 (A) 07/08/2018 0015   HGBUR NEGATIVE 07/08/2018 0015   BILIRUBINUR NEGATIVE 07/08/2018 0015   BILIRUBINUR negative 01/05/2017 0906   KETONESUR 20 (A) 07/08/2018 0015   PROTEINUR 30 (A) 07/08/2018 0015   UROBILINOGEN 0.2 01/05/2017 0906   NITRITE NEGATIVE 07/08/2018 0015   LEUKOCYTESUR MODERATE (A) 07/08/2018 0015   Sepsis Labs: Invalid input(s): PROCALCITONIN, LACTICIDVEN  Recent Results (from the past 240 hour(s))  SARS Coronavirus 2 (CEPHEID- Performed in [place] hospital lab), Hosp Order     Status: Abnormal   Collection Time: 07/07/18  9:10 PM   Specimen: Nasopharyngeal Swab  Result Value Ref Range Status   SARS Coronavirus 2 POSITIVE (A) NEGATIVE Final    Comment: RESULT CALLED TO, READ BACK BY AND VERIFIED WITH: H BRYANT,PA @2258  07/07/18 MKELLY (NOTE) If result is NEGATIVE SARS-CoV-2 target nucleic acids are NOT DETECTED. The SARS-CoV-2 RNA is generally detectable in upper and lower  respiratory specimens during the acute phase of infection. The lowest  concentration of SARS-CoV-2  viral copies this assay can detect is 250  copies / mL. A negative result does not preclude SARS-CoV-2 infection  and should not be used as the sole basis for treatment or other  patient management decisions.  A negative result may occur with  improper specimen collection / handling, submission of specimen other  than nasopharyngeal swab, presence of viral mutation(s) within the  areas targeted by this assay, and inadequate number of viral copies  (<250 copies / mL). A negative result must be combined with clinical  observations, patient history, and epidemiological information. If result is POSITIVE SARS-CoV-2 target nucleic acids are DETECTED. The  SARS-CoV-2 RNA is generally detectable in upper and lower  respiratory specimens during the acute phase of infection.  Positive  results are indicative of active infection with SARS-CoV-2.  Clinical  correlation with patient history and other diagnostic information is  necessary to determine patient infection status.  Positive results do  not rule out bacterial infection or co-infection with other viruses. If result is PRESUMPTIVE POSTIVE SARS-CoV-2 nucleic acids MAY BE PRESENT.   A presumptive positive result was obtained on the submitted specimen  and confirmed on repeat testing.  While 2019 novel coronavirus  (SARS-CoV-2) nucleic acids may be present in the submitted sample  additional confirmatory testing may be necessary for epidemiological  and / or clinical management purposes  to differentiate between  SARS-CoV-2 and other Sarbecovirus currently known to infect humans.  If clinically indicated additional testing with an alternate test  methodology [id number]) is a dvised. The SARS-CoV-2 RNA is generally  detectable in upper and lower respiratory specimens during the acute  phase of infection. The expected result is Negative. Fact Sheet for Patients:  StrictlyIdeas.no Fact Sheet for Healthcare Providers:  BankingDealers.co.za This test is not yet approved or cleared by the [place] FDA and has been authorized for detection and/or diagnosis of SARS-CoV-2 by FDA under an Emergency Use Authorization (EUA).  This EUA will remain in effect (meaning this test can be used) for the duration of the COVID-19 declaration under Section 564(b)(1) of the Act, 21 U.S.C. section 360bbb-3(b)(1), unless the authorization is terminated or revoked sooner. Performed at [facility], [address] [place], [place] [address]   Culture, blood (routine x 2)     Status: None (Preliminary result)   Collection Time: 07/07/18  9:34 PM   Specimen: BLOOD  Result Value Ref  Range Status   Specimen Description BLOOD LEFT ANTECUBITAL  Final   Special Requests   Final    BOTTLES DRAWN AEROBIC AND ANAEROBIC Blood Culture adequate volume   Culture   Final    NO GROWTH 4 DAYS Performed at [facility], [address], [place], [place] [address]    Report Status PENDING  Incomplete  Culture, blood (routine x 2)     Status: None (Preliminary result)   Collection Time: 07/07/18  9:35 PM   Specimen: BLOOD  Result Value Ref Range Status   Specimen Description BLOOD RIGHT ANTECUBITAL  Final   Special Requests   Final    BOTTLES DRAWN AEROBIC AND ANAEROBIC Blood Culture adequate volume   Culture   Final    NO GROWTH 4 DAYS Performed at [facility], [address] [place], [place] [address]    Report Status PENDING  Incomplete  Urine Culture     Status: Abnormal   Collection Time: 07/08/18 12:36 AM   Specimen: Urine, Clean Catch  Result Value Ref Range Status   Specimen Description   Final    URINE, CLEAN CATCH Performed at [facility], [address], [place], [place] [address]    Special Requests   Final    Normal Performed at [facility], [address] [place], [place] [address]    Culture (A)  Final    70,000 COLONIES/mL LACTOBACILLUS SPECIES Standardized susceptibility testing for  this organism is not available. Performed at [facility], [place] [address], [place], [place] [address]    Report Status 07/09/2018 FINAL  Final  Blood Culture (routine x 2)     Status: None (Preliminary result)   Collection Time: 07/09/18 10:00 AM   Specimen: BLOOD RIGHT ARM  Result Value Ref Range Status   Specimen Description BLOOD RIGHT ARM DRAWN BY RN  Final   Special Requests   Final    BOTTLES DRAWN AEROBIC AND ANAEROBIC Blood Culture adequate volume   Culture   Final    NO GROWTH 2 DAYS Performed at [facility], [address] [place], [place] [address]    Report Status PENDING  Incomplete  Blood Culture (routine x 2)     Status: None (Preliminary result)   Collection Time: 07/09/18 10:10 AM   Specimen: BLOOD LEFT ARM  Result Value Ref Range Status   Specimen Description BLOOD LEFT ARM DRAWN BY RN  Final   Special Requests   Final    BOTTLES DRAWN AEROBIC AND ANAEROBIC Blood Culture results may not be optimal due to an inadequate volume of blood received in culture bottles   Culture   Final    NO GROWTH 2 DAYS Performed at [facility], [address], [place],  [address]    Report Status PENDING  Incomplete      Radiology Studies: Dg Chest Port 1 View  Result Date: 07/09/2018 CLINICAL DATA:  Shortness of breath. History of lung carcinoma. Positive test for COVID-19 EXAM: PORTABLE CHEST 1 VIEW COMPARISON:  July 07, 2018 FINDINGS: There is airspace consolidation in the right lower lobe. There is more ill-defined airspace opacity in the left upper lobe and left base regions. Heart size and pulmonary vascularity are normal. No adenopathy. No bone lesions. IMPRESSION: Multifocal pneumonia with ground-glass type airspace opacity throughout much of the left lung. There is consolidation as well as airspace opacity in the right lower lung region. Stable cardiac silhouette.  No evident adenopathy. Electronically Signed   By: Gwyndolyn Saxon  Jasmine December III M.D.   On: 07/09/2018 10:02      Marzetta Board, MD, PhD [facility]  Contact via  [email or web address]  [place] P: [phone]  F: [phone]

## 2018-07-12 ENCOUNTER — Inpatient Hospital Stay (HOSPITAL_COMMUNITY): Payer: 59

## 2018-07-12 LAB — D-DIMER, QUANTITATIVE: D-Dimer, Quant: 1.91 ug/mL-FEU — ABNORMAL HIGH (ref 0.00–0.50)

## 2018-07-12 LAB — COMPREHENSIVE METABOLIC PANEL
ALT: 36 U/L (ref 0–44)
AST: 24 U/L (ref 15–41)
Albumin: 2.7 g/dL — ABNORMAL LOW (ref 3.5–5.0)
Alkaline Phosphatase: 111 U/L (ref 38–126)
Anion gap: 11 (ref 5–15)
BUN: 35 mg/dL — ABNORMAL HIGH (ref 6–20)
CO2: 23 mmol/L (ref 22–32)
Calcium: 8.7 mg/dL — ABNORMAL LOW (ref 8.9–10.3)
Chloride: 104 mmol/L (ref 98–111)
Creatinine, Ser: 0.75 mg/dL (ref 0.44–1.00)
GFR calc Af Amer: >60 mL/min (ref >60)
GFR calc non Af Amer: >60 mL/min (ref >60)
Glucose, Bld: 253 mg/dL — ABNORMAL HIGH (ref 70–99)
Potassium: 4.6 mmol/L (ref 3.5–5.1)
Sodium: 138 mmol/L (ref 135–145)
Total Bilirubin: 0.3 mg/dL (ref 0.3–1.2)
Total Protein: 6.8 g/dL (ref 6.5–8.1)

## 2018-07-12 LAB — CULTURE, BLOOD (ROUTINE X 2)
Culture: NO GROWTH
Culture: NO GROWTH
Special Requests: ADEQUATE
Special Requests: ADEQUATE

## 2018-07-12 LAB — CBC
HCT: 37.8 % (ref 36.0–46.0)
Hemoglobin: 12.4 g/dL (ref 12.0–15.0)
MCH: 28.1 pg (ref 26.0–34.0)
MCHC: 32.8 g/dL (ref 30.0–36.0)
MCV: 85.5 fL (ref 80.0–100.0)
Platelets: 306 10*3/uL (ref 150–400)
RBC: 4.42 MIL/uL (ref 3.87–5.11)
RDW: 12.7 % (ref 11.5–15.5)
WBC: 8.2 10*3/uL (ref 4.0–10.5)
nRBC: 0 % (ref 0.0–0.2)

## 2018-07-12 LAB — GLUCOSE, CAPILLARY
Glucose-Capillary: 239 mg/dL — ABNORMAL HIGH (ref 70–99)
Glucose-Capillary: 302 mg/dL — ABNORMAL HIGH (ref 70–99)
Glucose-Capillary: 287 mg/dL — ABNORMAL HIGH (ref 70–99)
Glucose-Capillary: 204 mg/dL — ABNORMAL HIGH (ref 70–99)

## 2018-07-12 LAB — FERRITIN: Ferritin: 256 ng/mL (ref 11–307)

## 2018-07-12 LAB — LACTATE DEHYDROGENASE: LDH: 337 U/L — ABNORMAL HIGH (ref 98–192)

## 2018-07-12 LAB — C-REACTIVE PROTEIN: CRP: 5.5 mg/dL — ABNORMAL HIGH (ref <1.0)

## 2018-07-12 MED ORDER — HYDROCODONE-HOMATROPINE 5-1.5 MG/5ML PO SYRP
5.0000 mL | ORAL_SOLUTION | ORAL | Status: DC | PRN
  Administered 2018-07-12: 5 mL via ORAL
  Filled 2018-07-12 (×2): qty 5

## 2018-07-12 MED ORDER — GUAIFENESIN-DM 100-10 MG/5ML PO SYRP
15.0000 mL | ORAL_SOLUTION | ORAL | Status: DC | PRN
  Administered 2018-07-12 – 2018-07-13 (×2): 15 mL via ORAL
  Filled 2018-07-12 (×2): qty 20

## 2018-07-12 NOTE — Progress Notes (Signed)
PROGRESS NOTE  Crystal Bryant OZH:[id number] DOB: 08-02-1970 DOA: 07/09/2018 PCP: Shawnee Knapp, MD   LOS: 3 days   Brief Narrative / Interim history: 48 year old female with history of chronic systolic CHF with most recent EF of 40%, non-small cell lung cancer status post lobectomy and VATS at [facility] in 2018, hypertension, type 2 diabetes mellitus, who comes to the hospital with chief complaint of shortness of breath, fever, general malaise and decreased appetite.  Patient was initially evaluated on 07/07/2018 in the ED for similar symptoms, was positive for COVID-19, but was not hypoxic and discharged home.  She returned and was admitted on 6/10 due to persistent and worsening symptoms.  She was found to be hypoxic, chest x-ray on admission showed multifocal pneumonia and was admitted to the hospital.  Subjective: Continues to complaints of coughing, feels weak, tired, fatigued, muscle aches.  Assessment & Plan: Active Problems:   Essential hypertension   History of malignant neoplasm of upper lobe bronchus or lung   Acute respiratory disease due to COVID-19 virus   Chronic systolic HF (heart failure) (HCC)   Principal Problem Acute Hypoxic Respiratory Failure due to Covid-19 Viral Illness, viral pneumonia, sepsis -Patient was admitted to the hospital with hypoxic respiratory failure in the setting of COVID-19 viral pneumonia. -Had elevated inflammatory markers, she was started on Solu-Medrol as well as Remdesivir on 6/10, continue, closely monitor -Stable, to finish steroids today.  Still has Remdesivir left -Wean off to room air as tolerated  The treatment plan and use of medications and known side effects were discussed with patient/family, they were clearly explained that there is no proven definitive treatment for COVID-19 infection, any medications used here are based on published clinical articles/anecdotal data which are not peer-reviewed or randomized control trials.  Complete  risks and long-term side effects are unknown, however in the best clinical judgment they seem to be of some clinical benefit rather than medical risks.  Patient agree with the treatment plan and want to receive the given medications.  COVID-19 Labs  Recent Labs    07/10/18 0300 07/11/18 0400 07/12/18 0835  DDIMER 0.67* 1.86* 1.91*  FERRITIN 308* 360* 256  LDH 311* 358* 337*  CRP 30.0* 15.1* 5.5*    Lab Results  Component Value Date   SARSCOV2NAA POSITIVE (A) 07/07/2018   Active Problems Chronic systolic CHF -Continue Coreg, Lasix, appears euvolemic.  Hold Entresto still  Type 2 diabetes mellitus -Fasting 230 this morning, lunchtime 302.  Stop steroids  Hypertension -Continue Coreg, she is normotensive  History of lung cancer -Monitor as an outpatient  Anxiety -Continue home Xanax   Scheduled Meds: . carvedilol  25 mg Oral BID  . enoxaparin (LOVENOX) injection  40 mg Subcutaneous Q24H  . furosemide  40 mg Intravenous Daily  . insulin aspart  0-15 Units Subcutaneous TID AC & HS  . insulin aspart  4 Units Subcutaneous TID WC  . insulin detemir  12 Units Subcutaneous QHS  . methylPREDNISolone (SOLU-MEDROL) injection  40 mg Intravenous Q12H  . pantoprazole  40 mg Oral Daily   Continuous Infusions: . remdesivir 100 mg in NS 250 mL 100 mg (07/11/18 1952)   PRN Meds:.acetaminophen, albuterol, ALPRAZolam, guaiFENesin-dextromethorphan, hydrALAZINE, ondansetron **OR** ondansetron (ZOFRAN) IV, oxyCODONE  DVT prophylaxis: Lovenox Code Status: Full code Family Communication: d/w patient Disposition Plan: home when ready   Consultants:   None   Procedures:   None   Antimicrobials:  Remdesevir 6/10 >>   Objective: Vitals:   07/12/18 0400 07/12/18  0500 07/12/18 1012 07/12/18 1246  BP: 112/81  129/84 123/84  Pulse:      Resp:      Temp: 97.8 F (36.6 C)  97.9 F (36.6 C) 98.3 F (36.8 C)  TempSrc: Oral  Oral Oral  SpO2:      Weight:  59.2 kg    Height:         Intake/Output Summary (Last 24 hours) at 07/12/2018 1303 Last data filed at 07/12/2018 0500 Gross per 24 hour  Intake 100 ml  Output -  Net 100 ml   Filed Weights   07/10/18 0500 07/11/18 0457 07/12/18 0500  Weight: 61.7 kg 61.7 kg 59.2 kg    Examination:  Constitutional: Appears uncomfortable Eyes: No icterus seen ENMT: Moist mucous membranes Neck: normal, supple Respiratory: Bibasilar rhonchi, no wheezing, no crackles.  Antalgic shallow breathing Cardiovascular: Regular rate and rhythm, no murmurs.  No peripheral edema Abdomen: Soft, NT, ND, positive bowel sounds Musculoskeletal: no clubbing / cyanosis. Skin: No rashes seen Neurologic: Nonfocal, equal strength Psychiatric: Alert and oriented x3   Data Reviewed: I have independently reviewed following labs and imaging studies   CBC: Recent Labs  Lab 07/07/18 2133 07/09/18 1015 07/10/18 0300 07/11/18 0400 07/12/18 0835  WBC 6.7 10.7* 8.6 10.9* 8.2  NEUTROABS 5.1 9.8*  --   --   --   HGB 13.7 13.3 11.6* 12.0 12.4  HCT 41.6 41.4 35.3* 37.5 37.8  MCV 84.2 86.3 85.3 85.2 85.5  PLT 192 235 220 272 416   Basic Metabolic Panel: Recent Labs  Lab 07/07/18 2133 07/09/18 1015 07/10/18 0300 07/11/18 0400 07/12/18 0835  NA 132* 138 134* 136 138  K 3.8 4.2 4.4 4.8 4.6  CL 97* 102 102 103 104  CO2 19* 18* 20* 21* 23  GLUCOSE 315* 306* 315* 306* 253*  BUN 12 12 22* 32* 35*  CREATININE 0.75 0.81 0.82 0.79 0.75  CALCIUM 8.5* 9.0 8.7* 9.0 8.7*  MG  --   --  2.4  --   --   PHOS  --   --  3.7  --   --    GFR: Estimated Creatinine Clearance: 79.9 mL/min (by C-G formula based on SCr of 0.75 mg/dL). Liver Function Tests: Recent Labs  Lab 07/07/18 2133 07/09/18 1015 07/10/18 0300 07/11/18 0400 07/12/18 0835  AST 32 50* 31 65* 24  ALT 34 42 32 41 36  ALKPHOS 103 122 97 114 111  BILITOT 0.6 1.1 0.8 0.4 0.3  PROT 7.6 8.0 7.3 7.2 6.8  ALBUMIN 3.4* 3.4* 2.9* 2.8* 2.7*   No results for input(s): LIPASE, AMYLASE  in the last 168 hours. No results for input(s): AMMONIA in the last 168 hours. Coagulation Profile: No results for input(s): INR, PROTIME in the last 168 hours. Cardiac Enzymes: Recent Labs  Lab 07/09/18 1015  TROPONINI <0.03   BNP (last 3 results) No results for input(s): PROBNP in the last 8760 hours. HbA1C: Recent Labs    07/10/18 0300  HGBA1C 14.1*   CBG: Recent Labs  Lab 07/11/18 1147 07/11/18 1605 07/11/18 2141 07/12/18 0819 07/12/18 1242  GLUCAP 250* 303* 251* 239* 302*   Lipid Profile: No results for input(s): CHOL, HDL, LDLCALC, TRIG, CHOLHDL, LDLDIRECT in the last 72 hours. Thyroid Function Tests: No results for input(s): TSH, T4TOTAL, FREET4, T3FREE, THYROIDAB in the last 72 hours. Anemia Panel: Recent Labs    07/11/18 0400 07/12/18 0835  FERRITIN 360* 256   Urine analysis:    Component Value  Date/Time   COLORURINE YELLOW 07/08/2018 0015   APPEARANCEUR HAZY (A) 07/08/2018 0015   LABSPEC 1.035 (H) 07/08/2018 0015   PHURINE 5.0 07/08/2018 0015   GLUCOSEU >=500 (A) 07/08/2018 0015   HGBUR NEGATIVE 07/08/2018 0015   BILIRUBINUR NEGATIVE 07/08/2018 0015   BILIRUBINUR negative 01/05/2017 0906   KETONESUR 20 (A) 07/08/2018 0015   PROTEINUR 30 (A) 07/08/2018 0015   UROBILINOGEN 0.2 01/05/2017 0906   NITRITE NEGATIVE 07/08/2018 0015   LEUKOCYTESUR MODERATE (A) 07/08/2018 0015   Sepsis Labs: Invalid input(s): PROCALCITONIN, LACTICIDVEN  Recent Results (from the past 240 hour(s))  SARS Coronavirus 2 (CEPHEID- Performed in [place] hospital lab), Hosp Order     Status: Abnormal   Collection Time: 07/07/18  9:10 PM   Specimen: Nasopharyngeal Swab  Result Value Ref Range Status   SARS Coronavirus 2 POSITIVE (A) NEGATIVE Final    Comment: RESULT CALLED TO, READ BACK BY AND VERIFIED WITH: H BRYANT,PA @2258  07/07/18 MKELLY (NOTE) If result is NEGATIVE SARS-CoV-2 target nucleic acids are NOT DETECTED. The SARS-CoV-2 RNA is generally detectable in  upper and lower  respiratory specimens during the acute phase of infection. The lowest  concentration of SARS-CoV-2 viral copies this assay can detect is 250  copies / mL. A negative result does not preclude SARS-CoV-2 infection  and should not be used as the sole basis for treatment or other  patient management decisions.  A negative result may occur with  improper specimen collection / handling, submission of specimen other  than nasopharyngeal swab, presence of viral mutation(s) within the  areas targeted by this assay, and inadequate number of viral copies  (<250 copies / mL). A negative result must be combined with clinical  observations, patient history, and epidemiological information. If result is POSITIVE SARS-CoV-2 target nucleic acids are DETECTED. The  SARS-CoV-2 RNA is generally detectable in upper and lower  respiratory specimens during the acute phase of infection.  Positive  results are indicative of active infection with SARS-CoV-2.  Clinical  correlation with patient history and other diagnostic information is  necessary to determine patient infection status.  Positive results do  not rule out bacterial infection or co-infection with other viruses. If result is PRESUMPTIVE POSTIVE SARS-CoV-2 nucleic acids MAY BE PRESENT.   A presumptive positive result was obtained on the submitted specimen  and confirmed on repeat testing.  While 2019 novel coronavirus  (SARS-CoV-2) nucleic acids may be present in the submitted sample  additional confirmatory testing may be necessary for epidemiological  and / or clinical management purposes  to differentiate between  SARS-CoV-2 and other Sarbecovirus currently known to infect humans.  If clinically indicated additional testing with an alternate test  methodology [phone]) is a dvised. The SARS-CoV-2 RNA is generally  detectable in upper and lower respiratory specimens during the acute  phase of infection. The expected result is  Negative. Fact Sheet for Patients:  StrictlyIdeas.no Fact Sheet for Healthcare Providers: BankingDealers.co.za This test is not yet approved or cleared by the [place] FDA and has been authorized for detection and/or diagnosis of SARS-CoV-2 by FDA under an Emergency Use Authorization (EUA).  This EUA will remain in effect (meaning this test can be used) for the duration of the COVID-19 declaration under Section 564(b)(1) of the Act, 21 U.S.C. section 360bbb-3(b)(1), unless the authorization is terminated or revoked sooner. Performed at [facility], [address], [place],  [address]   Culture, blood (routine x 2)     Status: None  Collection Time: 07/07/18  9:34 PM   Specimen: BLOOD  Result Value Ref Range Status   Specimen Description BLOOD LEFT ANTECUBITAL  Final   Special Requests   Final    BOTTLES DRAWN AEROBIC AND ANAEROBIC Blood Culture adequate volume   Culture   Final    NO GROWTH 5 DAYS Performed at [facility], [address] [place], [place] [address]    Report Status 07/12/2018 FINAL  Final  Culture, blood (routine x 2)     Status: None   Collection Time: 07/07/18  9:35 PM   Specimen: BLOOD  Result Value Ref Range Status   Specimen Description BLOOD RIGHT ANTECUBITAL  Final   Special Requests   Final    BOTTLES DRAWN AEROBIC AND ANAEROBIC Blood Culture adequate volume   Culture   Final    NO GROWTH 5 DAYS Performed at [facility], [address] [place], [place] [address]    Report Status 07/12/2018 FINAL  Final  Urine Culture     Status: Abnormal   Collection Time: 07/08/18 12:36 AM   Specimen: Urine, Clean Catch  Result Value Ref Range Status   Specimen Description   Final    URINE, CLEAN CATCH Performed at [facility], [address] [place], [place] [address]    Special Requests   Final    Normal Performed at [facility], [address] [place], [place] [address]    Culture (A)  Final     70,000 COLONIES/mL LACTOBACILLUS SPECIES Standardized susceptibility testing for this organism is not available. Performed at [facility], [place] [address], [place], [place] [address]    Report Status 07/09/2018 FINAL  Final  Blood Culture (routine x 2)     Status: None (Preliminary result)   Collection Time: 07/09/18 10:00 AM   Specimen: BLOOD RIGHT ARM  Result Value Ref Range Status   Specimen Description BLOOD RIGHT ARM DRAWN BY RN  Final   Special Requests   Final    BOTTLES DRAWN AEROBIC AND ANAEROBIC Blood Culture adequate volume   Culture   Final    NO GROWTH 3 DAYS Performed at [facility], [address] [place], [place] [address]    Report Status PENDING  Incomplete  Blood Culture (routine x 2)     Status: None (Preliminary result)   Collection Time: 07/09/18 10:10 AM   Specimen: BLOOD LEFT ARM  Result Value Ref Range Status   Specimen Description BLOOD LEFT ARM DRAWN BY RN  Final   Special Requests   Final    BOTTLES DRAWN AEROBIC AND ANAEROBIC Blood Culture results may not be optimal due to an inadequate volume of blood received in culture bottles   Culture   Final    NO GROWTH 3 DAYS Performed at [facility], [address] [place],  [address]    Report Status PENDING  Incomplete      Radiology Studies: Dg Chest Port 1 View  Result Date: 07/12/2018 CLINICAL DATA:  Dyspnea. COVID-19 positive. EXAM: PORTABLE CHEST 1 VIEW COMPARISON:  07/09/2018 FINDINGS: The cardiac silhouette is accentuated by low lung volumes and AP technique. Right basilar airspace opacity has mildly improved. Airspace consolidation in the left lung base with air bronchograms has mildly worsened. Left mid to upper lung. There is hazy opacity in the left mid lung which may have mildly progressed while hazy left upper lung opacity appears mildly improved. No sizable pleural effusion or pneumothorax is identified. IMPRESSION: Bilateral lung opacities consistent with  pneumonia with mixed  interval changes as above. Electronically Signed   By: Logan Bores M.D.   On: 07/12/2018 11:18     Marzetta Board, MD, PhD [facility]  Contact via  [email or web address]  [place] P: [phone]  F: [phone]

## 2018-07-13 LAB — COMPREHENSIVE METABOLIC PANEL
ALT: 30 U/L (ref 0–44)
AST: 19 U/L (ref 15–41)
Albumin: 2.7 g/dL — ABNORMAL LOW (ref 3.5–5.0)
Alkaline Phosphatase: 97 U/L (ref 38–126)
Anion gap: 12 (ref 5–15)
BUN: 28 mg/dL — ABNORMAL HIGH (ref 6–20)
CO2: 24 mmol/L (ref 22–32)
Calcium: 8.6 mg/dL — ABNORMAL LOW (ref 8.9–10.3)
Chloride: 102 mmol/L (ref 98–111)
Creatinine, Ser: 0.74 mg/dL (ref 0.44–1.00)
GFR calc Af Amer: >60 mL/min (ref >60)
GFR calc non Af Amer: >60 mL/min (ref >60)
Glucose, Bld: 194 mg/dL — ABNORMAL HIGH (ref 70–99)
Potassium: 4.5 mmol/L (ref 3.5–5.1)
Sodium: 138 mmol/L (ref 135–145)
Total Bilirubin: 0.2 mg/dL — ABNORMAL LOW (ref 0.3–1.2)
Total Protein: 6.6 g/dL (ref 6.5–8.1)

## 2018-07-13 LAB — CBC
HCT: 38.9 % (ref 36.0–46.0)
Hemoglobin: 12.2 g/dL (ref 12.0–15.0)
MCH: 27 pg (ref 26.0–34.0)
MCHC: 31.4 g/dL (ref 30.0–36.0)
MCV: 86.1 fL (ref 80.0–100.0)
Platelets: 359 10*3/uL (ref 150–400)
RBC: 4.52 MIL/uL (ref 3.87–5.11)
RDW: 12.5 % (ref 11.5–15.5)
WBC: 7 10*3/uL (ref 4.0–10.5)
nRBC: 0 % (ref 0.0–0.2)

## 2018-07-13 LAB — FERRITIN: Ferritin: 194 ng/mL (ref 11–307)

## 2018-07-13 LAB — C-REACTIVE PROTEIN: CRP: 3 mg/dL — ABNORMAL HIGH (ref <1.0)

## 2018-07-13 LAB — GLUCOSE, CAPILLARY
Glucose-Capillary: 166 mg/dL — ABNORMAL HIGH (ref 70–99)
Glucose-Capillary: 256 mg/dL — ABNORMAL HIGH (ref 70–99)
Glucose-Capillary: 153 mg/dL — ABNORMAL HIGH (ref 70–99)
Glucose-Capillary: 143 mg/dL — ABNORMAL HIGH (ref 70–99)

## 2018-07-13 LAB — D-DIMER, QUANTITATIVE: D-Dimer, Quant: 1.74 ug/mL-FEU — ABNORMAL HIGH (ref 0.00–0.50)

## 2018-07-13 LAB — LACTATE DEHYDROGENASE: LDH: 317 U/L — ABNORMAL HIGH (ref 98–192)

## 2018-07-13 MED ORDER — SACUBITRIL-VALSARTAN 24-26 MG PO TABS
1.0000 | ORAL_TABLET | Freq: Two times a day (BID) | ORAL | Status: DC
  Administered 2018-07-13 – 2018-07-15 (×5): 1 via ORAL
  Filled 2018-07-13 (×5): qty 1

## 2018-07-13 NOTE — Progress Notes (Signed)
Pt phone found by charge RN in laundry.  Disinfected and returned to patient.

## 2018-07-13 NOTE — Progress Notes (Signed)
Pt reports lost iphone in pink and black case.  RN noted to see phone sitting on patient's bedside table in earlier rounds.  Unable to find in room.  Contacted housekeeping, checked linens and trash.  Agricultural consultant notified.

## 2018-07-13 NOTE — Progress Notes (Signed)
PROGRESS NOTE  Crystal Bryant JXB:[id number] DOB: Sep 01, 1970 DOA: 07/09/2018 PCP: Shawnee Knapp, MD   LOS: 4 days   Brief Narrative / Interim history: 48 year old female with history of chronic systolic CHF with most recent EF of 40%, non-small cell lung cancer status post lobectomy and VATS at [facility] in 2018, hypertension, type 2 diabetes mellitus, who comes to the hospital with chief complaint of shortness of breath, fever, general malaise and decreased appetite.  Patient was initially evaluated on 07/07/2018 in the ED for similar symptoms, was positive for COVID-19, but was not hypoxic and discharged home.  She returned and was admitted on 6/10 due to persistent and worsening symptoms.  She was found to be hypoxic, chest x-ray on admission showed multifocal pneumonia and was admitted to the hospital.  Subjective: Feeling a little bit better today, cough is better but still feels quite uncomfortable.  Complains of generalized weakness and muscle aches.  Assessment & Plan: Active Problems:   Essential hypertension   History of malignant neoplasm of upper lobe bronchus or lung   Acute respiratory disease due to COVID-19 virus   Chronic systolic HF (heart failure) (HCC)   Principal Problem Acute Hypoxic Respiratory Failure due to Covid-19 Viral Illness, viral pneumonia, sepsis -Patient was admitted to the hospital with hypoxic respiratory failure in the setting of COVID-19 viral pneumonia. -Had elevated inflammatory markers -Finished 3 days of Solu-Medrol -Started on Remdesivir on 6/10, today's last day -Wean off to room air as tolerated, ambulate more today  The treatment plan and use of medications and known side effects were discussed with patient/family, they were clearly explained that there is no proven definitive treatment for COVID-19 infection, any medications used here are based on published clinical articles/anecdotal data which are not peer-reviewed or randomized control trials.   Complete risks and long-term side effects are unknown, however in the best clinical judgment they seem to be of some clinical benefit rather than medical risks.  Patient agree with the treatment plan and want to receive the given medications.  COVID-19 Labs  Recent Labs    07/11/18 0400 07/12/18 0835 07/13/18 0310  DDIMER 1.86* 1.91* 1.74*  FERRITIN 360* 256 194  LDH 358* 337* 317*  CRP 15.1* 5.5* 3.0*    Lab Results  Component Value Date   SARSCOV2NAA POSITIVE (A) 07/07/2018   Active Problems Chronic systolic CHF -Continue Coreg, Lasix, appears euvolemic.  We will resume Entresto today but at a lower dose and closely monitor blood pressure  Type 2 diabetes mellitus -CBGs look better after steroids were discontinued  CBG (last 3)  Recent Labs    07/12/18 1555 07/12/18 2116 07/13/18 0727  GLUCAP 287* 204* 166*    Hypertension -Continue Coreg, add Entresto as above  History of lung cancer -Monitor as an outpatient  Anxiety -Continue home Xanax   Scheduled Meds: . carvedilol  25 mg Oral BID  . enoxaparin (LOVENOX) injection  40 mg Subcutaneous Q24H  . furosemide  40 mg Intravenous Daily  . insulin aspart  0-15 Units Subcutaneous TID AC & HS  . insulin aspart  4 Units Subcutaneous TID WC  . insulin detemir  12 Units Subcutaneous QHS  . pantoprazole  40 mg Oral Daily   Continuous Infusions: . remdesivir 100 mg in NS 250 mL 100 mg (07/12/18 1935)   PRN Meds:.acetaminophen, albuterol, ALPRAZolam, guaiFENesin-dextromethorphan, hydrALAZINE, HYDROcodone-homatropine, ondansetron **OR** ondansetron (ZOFRAN) IV, oxyCODONE  DVT prophylaxis: Lovenox Code Status: Full code Family Communication: d/w patient Disposition Plan: home when ready,  possibly within 24 hours  Consultants:   None   Procedures:   None   Antimicrobials:  Remdesevir 6/10 >>   Objective: Vitals:   07/12/18 1945 07/12/18 2304 07/13/18 0319 07/13/18 0750  BP: 105/79 (!) 123/91 (!) 128/98    Pulse: 82 80 81   Resp: (!) 28 (!) 34 (!) 31   Temp: 98.5 F (36.9 C) 97.6 F (36.4 C) 98.6 F (37 C) 98.2 F (36.8 C)  TempSrc: Oral Oral Oral Oral  SpO2: 98% 99% 97%   Weight:      Height:       No intake or output data in the 24 hours ending 07/13/18 0941 Filed Weights   07/10/18 0500 07/11/18 0457 07/12/18 0500  Weight: 61.7 kg 61.7 kg 59.2 kg    Examination:  Constitutional: more comfortable this morning Eyes: No scleral icterus ENMT: Moist mucous membranes Neck: normal, supple Respiratory: Faint rhonchi at the bases, no wheezing or crackles heard.  Tachypneic Cardiovascular: Regular rate and rhythm, no murmurs.  No edema Abdomen: Soft, nontender, nondistended, bowel sounds positive Musculoskeletal: no clubbing / cyanosis. Skin: No rashes seen Neurologic: No focal deficits, strength 5/5 in all 4 Psychiatric: Alert and oriented x3   Data Reviewed: I have independently reviewed following labs and imaging studies   CBC: Recent Labs  Lab 07/07/18 2133 07/09/18 1015 07/10/18 0300 07/11/18 0400 07/12/18 0835 07/13/18 0310  WBC 6.7 10.7* 8.6 10.9* 8.2 7.0  NEUTROABS 5.1 9.8*  --   --   --   --   HGB 13.7 13.3 11.6* 12.0 12.4 12.2  HCT 41.6 41.4 35.3* 37.5 37.8 38.9  MCV 84.2 86.3 85.3 85.2 85.5 86.1  PLT 192 235 220 272 306 938   Basic Metabolic Panel: Recent Labs  Lab 07/09/18 1015 07/10/18 0300 07/11/18 0400 07/12/18 0835 07/13/18 0310  NA 138 134* 136 138 138  K 4.2 4.4 4.8 4.6 4.5  CL 102 102 103 104 102  CO2 18* 20* 21* 23 24  GLUCOSE 306* 315* 306* 253* 194*  BUN 12 22* 32* 35* 28*  CREATININE 0.81 0.82 0.79 0.75 0.74  CALCIUM 9.0 8.7* 9.0 8.7* 8.6*  MG  --  2.4  --   --   --   PHOS  --  3.7  --   --   --    GFR: Estimated Creatinine Clearance: 79.9 mL/min (by C-G formula based on SCr of 0.74 mg/dL). Liver Function Tests: Recent Labs  Lab 07/09/18 1015 07/10/18 0300 07/11/18 0400 07/12/18 0835 07/13/18 0310  AST 50* 31 65* 24 19   ALT 42 32 41 36 30  ALKPHOS 122 97 114 111 97  BILITOT 1.1 0.8 0.4 0.3 0.2*  PROT 8.0 7.3 7.2 6.8 6.6  ALBUMIN 3.4* 2.9* 2.8* 2.7* 2.7*   No results for input(s): LIPASE, AMYLASE in the last 168 hours. No results for input(s): AMMONIA in the last 168 hours. Coagulation Profile: No results for input(s): INR, PROTIME in the last 168 hours. Cardiac Enzymes: Recent Labs  Lab 07/09/18 1015  TROPONINI <0.03   BNP (last 3 results) No results for input(s): PROBNP in the last 8760 hours. HbA1C: No results for input(s): HGBA1C in the last 72 hours. CBG: Recent Labs  Lab 07/12/18 0819 07/12/18 1242 07/12/18 1555 07/12/18 2116 07/13/18 0727  GLUCAP 239* 302* 287* 204* 166*   Lipid Profile: No results for input(s): CHOL, HDL, LDLCALC, TRIG, CHOLHDL, LDLDIRECT in the last 72 hours. Thyroid Function Tests: No results for input(s):  TSH, T4TOTAL, FREET4, T3FREE, THYROIDAB in the last 72 hours. Anemia Panel: Recent Labs    07/12/18 0835 07/13/18 0310  FERRITIN 256 194   Urine analysis:    Component Value Date/Time   COLORURINE YELLOW 07/08/2018 0015   APPEARANCEUR HAZY (A) 07/08/2018 0015   LABSPEC 1.035 (H) 07/08/2018 0015   PHURINE 5.0 07/08/2018 0015   GLUCOSEU >=500 (A) 07/08/2018 0015   HGBUR NEGATIVE 07/08/2018 0015   BILIRUBINUR NEGATIVE 07/08/2018 0015   BILIRUBINUR negative 01/05/2017 0906   KETONESUR 20 (A) 07/08/2018 0015   PROTEINUR 30 (A) 07/08/2018 0015   UROBILINOGEN 0.2 01/05/2017 0906   NITRITE NEGATIVE 07/08/2018 0015   LEUKOCYTESUR MODERATE (A) 07/08/2018 0015   Sepsis Labs: Invalid input(s): PROCALCITONIN, LACTICIDVEN  Recent Results (from the past 240 hour(s))  SARS Coronavirus 2 (CEPHEID- Performed in [facility] hospital lab), Hosp Order     Status: Abnormal   Collection Time: 07/07/18  9:10 PM   Specimen: Nasopharyngeal Swab  Result Value Ref Range Status   SARS Coronavirus 2 POSITIVE (A) NEGATIVE Final    Comment: RESULT CALLED TO, READ BACK  BY AND VERIFIED WITH: H BRYANT,PA @2258  07/07/18 MKELLY (NOTE) If result is NEGATIVE SARS-CoV-2 target nucleic acids are NOT DETECTED. The SARS-CoV-2 RNA is generally detectable in upper and lower  respiratory specimens during the acute phase of infection. The lowest  concentration of SARS-CoV-2 viral copies this assay can detect is 250  copies / mL. A negative result does not preclude SARS-CoV-2 infection  and should not be used as the sole basis for treatment or other  patient management decisions.  A negative result may occur with  improper specimen collection / handling, submission of specimen other  than nasopharyngeal swab, presence of viral mutation(s) within the  areas targeted by this assay, and inadequate number of viral copies  (<250 copies / mL). A negative result must be combined with clinical  observations, patient history, and epidemiological information. If result is POSITIVE SARS-CoV-2 target nucleic acids are DETECTED. The  SARS-CoV-2 RNA is generally detectable in upper and lower  respiratory specimens during the acute phase of infection.  Positive  results are indicative of active infection with SARS-CoV-2.  Clinical  correlation with patient history and other diagnostic information is  necessary to determine patient infection status.  Positive results do  not rule out bacterial infection or co-infection with other viruses. If result is PRESUMPTIVE POSTIVE SARS-CoV-2 nucleic acids MAY BE PRESENT.   A presumptive positive result was obtained on the submitted specimen  and confirmed on repeat testing.  While 2019 novel coronavirus  (SARS-CoV-2) nucleic acids may be present in the submitted sample  additional confirmatory testing may be necessary for epidemiological  and / or clinical management purposes  to differentiate between  SARS-CoV-2 and other Sarbecovirus currently known to infect humans.  If clinically indicated additional testing with an alternate test   methodology [phone]) is a dvised. The SARS-CoV-2 RNA is generally  detectable in upper and lower respiratory specimens during the acute  phase of infection. The expected result is Negative. Fact Sheet for Patients:  StrictlyIdeas.no Fact Sheet for Healthcare Providers: BankingDealers.co.za This test is not yet approved or cleared by the [place] FDA and has been authorized for detection and/or diagnosis of SARS-CoV-2 by FDA under an Emergency Use Authorization (EUA).  This EUA will remain in effect (meaning this test can be used) for the duration of the COVID-19 declaration under Section 564(b)(1) of the Act, 21 U.S.C. section 360bbb-3(b)(1),  unless the authorization is terminated or revoked sooner. Performed at [facility], [address] [place], [place] [address]   Culture, blood (routine x 2)     Status: None   Collection Time: 07/07/18  9:34 PM   Specimen: BLOOD  Result Value Ref Range Status   Specimen Description BLOOD LEFT ANTECUBITAL  Final   Special Requests   Final    BOTTLES DRAWN AEROBIC AND ANAEROBIC Blood Culture adequate volume   Culture   Final    NO GROWTH 5 DAYS Performed at [facility], [address] [place], [place] [address]    Report Status 07/12/2018 FINAL  Final  Culture, blood (routine x 2)     Status: None   Collection Time: 07/07/18  9:35 PM   Specimen: BLOOD  Result Value Ref Range Status   Specimen Description BLOOD RIGHT ANTECUBITAL  Final   Special Requests   Final    BOTTLES DRAWN AEROBIC AND ANAEROBIC Blood Culture adequate volume   Culture   Final    NO GROWTH 5 DAYS Performed at [facility], [address], [place], [place] [address]    Report Status 07/12/2018 FINAL  Final  Urine Culture     Status: Abnormal   Collection Time: 07/08/18 12:36 AM   Specimen: Urine, Clean Catch  Result Value Ref Range Status   Specimen Description   Final    URINE, CLEAN CATCH Performed at [facility], [address] [place], [place] [address]    Special Requests   Final    Normal Performed at [facility], [address], [place], [place] [address]    Culture (A)  Final    70,000 COLONIES/mL LACTOBACILLUS SPECIES Standardized susceptibility testing for this organism is not available. Performed at [facility], [address], [place], [place] [address]    Report Status 07/09/2018 FINAL  Final  Blood Culture (routine x 2)     Status: None (Preliminary result)   Collection Time: 07/09/18 10:00 AM   Specimen: BLOOD RIGHT ARM  Result Value Ref Range Status   Specimen Description BLOOD RIGHT ARM DRAWN BY RN  Final   Special Requests   Final    BOTTLES DRAWN AEROBIC AND ANAEROBIC Blood Culture adequate volume   Culture   Final    NO GROWTH 3 DAYS Performed at [facility], [address] [place], [place] [address]    Report Status PENDING  Incomplete  Blood Culture (routine x 2)     Status: None (Preliminary result)   Collection Time: 07/09/18 10:10 AM   Specimen: BLOOD LEFT ARM  Result Value Ref Range Status   Specimen Description BLOOD LEFT ARM DRAWN BY RN  Final   Special Requests   Final    BOTTLES DRAWN AEROBIC AND ANAEROBIC Blood Culture results may not be optimal due to an inadequate volume of blood received in culture bottles   Culture   Final    NO GROWTH 3 DAYS Performed at [facility], [address], [place], [place] [address]    Report Status PENDING  Incomplete      Radiology Studies: Dg Chest Port 1 View  Result Date: 07/12/2018 CLINICAL DATA:  Dyspnea. COVID-19 positive. EXAM: PORTABLE CHEST 1 VIEW COMPARISON:  07/09/2018 FINDINGS: The cardiac silhouette is accentuated by low lung volumes and AP technique. Right basilar airspace opacity has mildly improved. Airspace consolidation in the left lung base with air bronchograms has mildly worsened. Left mid to upper lung. There is hazy  opacity in the left mid lung which may have mildly progressed while  hazy left upper lung opacity appears mildly improved. No sizable pleural effusion or pneumothorax is identified. IMPRESSION: Bilateral lung opacities consistent with pneumonia with mixed interval changes as above. Electronically Signed   By: Logan Bores M.D.   On: 07/12/2018 11:18     Marzetta Board, MD, PhD [facility]  Contact via  [email or web address]  [place] P: [phone]  F: [phone]

## 2018-07-14 LAB — COMPREHENSIVE METABOLIC PANEL
ALT: 23 U/L (ref 0–44)
AST: 16 U/L (ref 15–41)
Albumin: 2.6 g/dL — ABNORMAL LOW (ref 3.5–5.0)
Alkaline Phosphatase: 87 U/L (ref 38–126)
Anion gap: 9 (ref 5–15)
BUN: 22 mg/dL — ABNORMAL HIGH (ref 6–20)
CO2: 26 mmol/L (ref 22–32)
Calcium: 8.3 mg/dL — ABNORMAL LOW (ref 8.9–10.3)
Chloride: 102 mmol/L (ref 98–111)
Creatinine, Ser: 0.6 mg/dL (ref 0.44–1.00)
GFR calc Af Amer: >60 mL/min (ref >60)
GFR calc non Af Amer: >60 mL/min (ref >60)
Glucose, Bld: 129 mg/dL — ABNORMAL HIGH (ref 70–99)
Potassium: 3.4 mmol/L — ABNORMAL LOW (ref 3.5–5.1)
Sodium: 137 mmol/L (ref 135–145)
Total Bilirubin: 0.6 mg/dL (ref 0.3–1.2)
Total Protein: 6 g/dL — ABNORMAL LOW (ref 6.5–8.1)

## 2018-07-14 LAB — FERRITIN: Ferritin: 175 ng/mL (ref 11–307)

## 2018-07-14 LAB — CBC
HCT: 38.8 % (ref 36.0–46.0)
Hemoglobin: 12.7 g/dL (ref 12.0–15.0)
MCH: 27.8 pg (ref 26.0–34.0)
MCHC: 32.7 g/dL (ref 30.0–36.0)
MCV: 84.9 fL (ref 80.0–100.0)
Platelets: 380 10*3/uL (ref 150–400)
RBC: 4.57 MIL/uL (ref 3.87–5.11)
RDW: 12.2 % (ref 11.5–15.5)
WBC: 7.5 10*3/uL (ref 4.0–10.5)
nRBC: 0 % (ref 0.0–0.2)

## 2018-07-14 LAB — LACTATE DEHYDROGENASE: LDH: 264 U/L — ABNORMAL HIGH (ref 98–192)

## 2018-07-14 LAB — GLUCOSE, CAPILLARY
Glucose-Capillary: 115 mg/dL — ABNORMAL HIGH (ref 70–99)
Glucose-Capillary: 209 mg/dL — ABNORMAL HIGH (ref 70–99)
Glucose-Capillary: 178 mg/dL — ABNORMAL HIGH (ref 70–99)
Glucose-Capillary: 401 mg/dL — ABNORMAL HIGH (ref 70–99)

## 2018-07-14 LAB — C-REACTIVE PROTEIN: CRP: 3 mg/dL — ABNORMAL HIGH (ref <1.0)

## 2018-07-14 LAB — D-DIMER, QUANTITATIVE: D-Dimer, Quant: 2.16 ug/mL-FEU — ABNORMAL HIGH (ref 0.00–0.50)

## 2018-07-14 MED ORDER — POTASSIUM CHLORIDE CRYS ER 20 MEQ PO TBCR
40.0000 meq | EXTENDED_RELEASE_TABLET | Freq: Once | ORAL | Status: AC
  Administered 2018-07-14: 11:00:00 40 meq via ORAL
  Filled 2018-07-14: qty 2

## 2018-07-14 MED ORDER — METHYLPREDNISOLONE SODIUM SUCC 40 MG IJ SOLR
30.0000 mg | Freq: Two times a day (BID) | INTRAMUSCULAR | Status: DC
  Administered 2018-07-14 (×2): 30 mg via INTRAVENOUS
  Filled 2018-07-14 (×2): qty 1

## 2018-07-14 NOTE — Progress Notes (Signed)
PROGRESS NOTE  Crystal Bryant JYN:[id number] DOB: 09-20-1970 DOA: 07/09/2018 PCP: Shawnee Knapp, MD   LOS: 5 days   Brief Narrative / Interim history: 48 year old female with history of chronic systolic CHF with most recent EF of 40%, non-small cell lung cancer status post lobectomy and VATS at [facility] in 2018, hypertension, type 2 diabetes mellitus, who comes to the hospital with chief complaint of shortness of breath, fever, general malaise and decreased appetite.  Patient was initially evaluated on 07/07/2018 in the ED for similar symptoms, was positive for COVID-19, but was not hypoxic and discharged home.  She returned and was admitted on 6/10 due to persistent and worsening symptoms.  She was found to be hypoxic, chest x-ray on admission showed multifocal pneumonia and was admitted to the hospital.  Subjective: Feels weak, fatigued, still short of breath with minimal activity  Assessment & Plan: Active Problems:   Essential hypertension   History of malignant neoplasm of upper lobe bronchus or lung   Acute respiratory disease due to COVID-19 virus   Chronic systolic HF (heart failure) (HCC)   Principal Problem Acute Hypoxic Respiratory Failure due to Covid-19 Viral Illness, viral pneumonia, sepsis -Patient was admitted to the hospital with hypoxic respiratory failure in the setting of COVID-19 viral pneumonia. -Had elevated inflammatory markers -Finished 3 days of Solu-Medrol.  Given persistent tachypnea resume steroids today -Started on Remdesivir on 6/10 finished on 6/14 -Able to be weaned off to room air, sats in the low 90s, however still remains tachypneic with a respiratory rate in the mid to upper 20s, getting significantly dyspneic with minimal activity.  Continue to monitor.  The treatment plan and use of medications and known side effects were discussed with patient/family, they were clearly explained that there is no proven definitive treatment for COVID-19 infection, any  medications used here are based on published clinical articles/anecdotal data which are not peer-reviewed or randomized control trials.  Complete risks and long-term side effects are unknown, however in the best clinical judgment they seem to be of some clinical benefit rather than medical risks.  Patient agree with the treatment plan and want to receive the given medications.  COVID-19 Labs  Recent Labs    07/12/18 0835 07/13/18 0310 07/14/18 0542  DDIMER 1.91* 1.74* 2.16*  FERRITIN 256 194 175  LDH 337* 317* 264*  CRP 5.5* 3.0* 3.0*    Lab Results  Component Value Date   SARSCOV2NAA POSITIVE (A) 07/07/2018   Active Problems Chronic systolic CHF -Continue Coreg, Lasix, appears euvolemic.  Entresto resumed on 6/14  Type 2 diabetes mellitus -CBGs look better after steroids were discontinued, resume steroids today and closely monitor CBGs  CBG (last 3)  Recent Labs    07/13/18 1702 07/13/18 2025 07/14/18 0749  GLUCAP 153* 143* 115*    Hypertension -Continue Coreg, Entresto, blood pressure stable  History of lung cancer -Monitor as an outpatient  Anxiety -Continue home Xanax   Scheduled Meds:  carvedilol  25 mg Oral BID   enoxaparin (LOVENOX) injection  40 mg Subcutaneous Q24H   furosemide  40 mg Intravenous Daily   insulin aspart  0-15 Units Subcutaneous TID AC & HS   insulin aspart  4 Units Subcutaneous TID WC   insulin detemir  12 Units Subcutaneous QHS   pantoprazole  40 mg Oral Daily   potassium chloride  40 mEq Oral Once   sacubitril-valsartan  1 tablet Oral BID   Continuous Infusions:  PRN Meds:.acetaminophen, albuterol, ALPRAZolam, guaiFENesin-dextromethorphan, hydrALAZINE, HYDROcodone-homatropine, ondansetron **  OR** ondansetron (ZOFRAN) IV, oxyCODONE  DVT prophylaxis: Lovenox Code Status: Full code Family Communication: d/w patient Disposition Plan: home when ready, possibly tomorrow  Consultants:   None   Procedures:   None    Antimicrobials:  Remdesevir 6/10 >>   Objective: Vitals:   07/14/18 0025 07/14/18 0453 07/14/18 0456 07/14/18 0751  BP: 113/79 113/77    Pulse: 90 90    Resp: (!) 26 (!) 22    Temp: 99.1 F (37.3 C) 98.9 F (37.2 C)  98.4 F (36.9 C)  TempSrc: Oral Oral  Oral  SpO2: 94% 95%    Weight:   62.5 kg   Height:        Intake/Output Summary (Last 24 hours) at 07/14/2018 1046 Last data filed at 07/14/2018 0300 Gross per 24 hour  Intake 490 ml  Output --  Net 490 ml   Filed Weights   07/11/18 0457 07/12/18 0500 07/14/18 0456  Weight: 61.7 kg 59.2 kg 62.5 kg    Examination:  Constitutional: Appears anxious Eyes: No icterus seen ENMT: Moist mucous membranes Neck: normal, supple Respiratory: Diminished at the bases, no wheezing, no crackles.  Tachypneic Cardiovascular: Regular rate and rhythm, no murmurs Abdomen: Soft, NT, ND, positive bowel sounds Musculoskeletal: no clubbing / cyanosis. Skin: No rashes Neurologic: Nonfocal, equal strength Psychiatric: Alert and oriented x3   Data Reviewed: I have independently reviewed following labs and imaging studies   CBC: Recent Labs  Lab 07/07/18 2133 07/09/18 1015 07/10/18 0300 07/11/18 0400 07/12/18 0835 07/13/18 0310 07/14/18 0542  WBC 6.7 10.7* 8.6 10.9* 8.2 7.0 7.5  NEUTROABS 5.1 9.8*  --   --   --   --   --   HGB 13.7 13.3 11.6* 12.0 12.4 12.2 12.7  HCT 41.6 41.4 35.3* 37.5 37.8 38.9 38.8  MCV 84.2 86.3 85.3 85.2 85.5 86.1 84.9  PLT 192 235 220 272 306 359 981   Basic Metabolic Panel: Recent Labs  Lab 07/10/18 0300 07/11/18 0400 07/12/18 0835 07/13/18 0310 07/14/18 0542  NA 134* 136 138 138 137  K 4.4 4.8 4.6 4.5 3.4*  CL 102 103 104 102 102  CO2 20* 21* 23 24 26   GLUCOSE 315* 306* 253* 194* 129*  BUN 22* 32* 35* 28* 22*  CREATININE 0.82 0.79 0.75 0.74 0.60  CALCIUM 8.7* 9.0 8.7* 8.6* 8.3*  MG 2.4  --   --   --   --   PHOS 3.7  --   --   --   --    GFR: Estimated Creatinine Clearance: 79.9  mL/min (by C-G formula based on SCr of 0.6 mg/dL). Liver Function Tests: Recent Labs  Lab 07/10/18 0300 07/11/18 0400 07/12/18 0835 07/13/18 0310 07/14/18 0542  AST 31 65* 24 19 16   ALT 32 41 36 30 23  ALKPHOS 97 114 111 97 87  BILITOT 0.8 0.4 0.3 0.2* 0.6  PROT 7.3 7.2 6.8 6.6 6.0*  ALBUMIN 2.9* 2.8* 2.7* 2.7* 2.6*   No results for input(s): LIPASE, AMYLASE in the last 168 hours. No results for input(s): AMMONIA in the last 168 hours. Coagulation Profile: No results for input(s): INR, PROTIME in the last 168 hours. Cardiac Enzymes: Recent Labs  Lab 07/09/18 1015  TROPONINI <0.03   BNP (last 3 results) No results for input(s): PROBNP in the last 8760 hours. HbA1C: No results for input(s): HGBA1C in the last 72 hours. CBG: Recent Labs  Lab 07/13/18 0727 07/13/18 1151 07/13/18 1702 07/13/18 2025 07/14/18 0749  GLUCAP 166* 256* 153* 143* 115*   Lipid Profile: No results for input(s): CHOL, HDL, LDLCALC, TRIG, CHOLHDL, LDLDIRECT in the last 72 hours. Thyroid Function Tests: No results for input(s): TSH, T4TOTAL, FREET4, T3FREE, THYROIDAB in the last 72 hours. Anemia Panel: Recent Labs    07/13/18 0310 07/14/18 0542  FERRITIN 194 175   Urine analysis:    Component Value Date/Time   COLORURINE YELLOW 07/08/2018 0015   APPEARANCEUR HAZY (A) 07/08/2018 0015   LABSPEC 1.035 (H) 07/08/2018 0015   PHURINE 5.0 07/08/2018 0015   GLUCOSEU >=500 (A) 07/08/2018 0015   HGBUR NEGATIVE 07/08/2018 0015   BILIRUBINUR NEGATIVE 07/08/2018 0015   BILIRUBINUR negative 01/05/2017 0906   KETONESUR 20 (A) 07/08/2018 0015   PROTEINUR 30 (A) 07/08/2018 0015   UROBILINOGEN 0.2 01/05/2017 0906   NITRITE NEGATIVE 07/08/2018 0015   LEUKOCYTESUR MODERATE (A) 07/08/2018 0015   Sepsis Labs: Invalid input(s): PROCALCITONIN, LACTICIDVEN  Recent Results (from the past 240 hour(s))  SARS Coronavirus 2 (CEPHEID- Performed in [place] hospital lab), Hosp Order     Status: Abnormal    Collection Time: 07/07/18  9:10 PM   Specimen: Nasopharyngeal Swab  Result Value Ref Range Status   SARS Coronavirus 2 POSITIVE (A) NEGATIVE Final    Comment: RESULT CALLED TO, READ BACK BY AND VERIFIED WITH: H BRYANT,PA @2258  07/07/18 MKELLY (NOTE) If result is NEGATIVE SARS-CoV-2 target nucleic acids are NOT DETECTED. The SARS-CoV-2 RNA is generally detectable in upper and lower  respiratory specimens during the acute phase of infection. The lowest  concentration of SARS-CoV-2 viral copies this assay can detect is 250  copies / mL. A negative result does not preclude SARS-CoV-2 infection  and should not be used as the sole basis for treatment or other  patient management decisions.  A negative result may occur with  improper specimen collection / handling, submission of specimen other  than nasopharyngeal swab, presence of viral mutation(s) within the  areas targeted by this assay, and inadequate number of viral copies  (<250 copies / mL). A negative result must be combined with clinical  observations, patient history, and epidemiological information. If result is POSITIVE SARS-CoV-2 target nucleic acids are DETECTED. The  SARS-CoV-2 RNA is generally detectable in upper and lower  respiratory specimens during the acute phase of infection.  Positive  results are indicative of active infection with SARS-CoV-2.  Clinical  correlation with patient history and other diagnostic information is  necessary to determine patient infection status.  Positive results do  not rule out bacterial infection or co-infection with other viruses. If result is PRESUMPTIVE POSTIVE SARS-CoV-2 nucleic acids MAY BE PRESENT.   A presumptive positive result was obtained on the submitted specimen  and confirmed on repeat testing.  While 2019 novel coronavirus  (SARS-CoV-2) nucleic acids may be present in the submitted sample  additional confirmatory testing may be necessary for epidemiological  and / or  clinical management purposes  to differentiate between  SARS-CoV-2 and other Sarbecovirus currently known to infect humans.  If clinically indicated additional testing with an alternate test  methodology [phone]) is a dvised. The SARS-CoV-2 RNA is generally  detectable in upper and lower respiratory specimens during the acute  phase of infection. The expected result is Negative. Fact Sheet for Patients:  StrictlyIdeas.no Fact Sheet for Healthcare Providers: BankingDealers.co.za This test is not yet approved or cleared by the [place] FDA and has been authorized for detection and/or diagnosis of SARS-CoV-2 by FDA under an Emergency Use  Authorization (EUA).  This EUA will remain in effect (meaning this test can be used) for the duration of the COVID-19 declaration under Section 564(b)(1) of the Act, 21 U.S.C. section 360bbb-3(b)(1), unless the authorization is terminated or revoked sooner. Performed at [facility], [address] [place], [place] [address]   Culture, blood (routine x 2)     Status: None   Collection Time: 07/07/18  9:34 PM   Specimen: BLOOD  Result Value Ref Range Status   Specimen Description BLOOD LEFT ANTECUBITAL  Final   Special Requests   Final    BOTTLES DRAWN AEROBIC AND ANAEROBIC Blood Culture adequate volume   Culture   Final    NO GROWTH 5 DAYS Performed at [facility], [address], [place], [place] [address]    Report Status 07/12/2018 FINAL  Final  Culture, blood (routine x 2)     Status: None   Collection Time: 07/07/18  9:35 PM   Specimen: BLOOD  Result Value Ref Range Status   Specimen Description BLOOD RIGHT ANTECUBITAL  Final   Special Requests   Final    BOTTLES DRAWN AEROBIC AND ANAEROBIC Blood Culture adequate volume   Culture   Final    NO GROWTH 5 DAYS Performed at [facility], [address], [place], [place] [address]    Report Status 07/12/2018 FINAL  Final  Urine Culture      Status: Abnormal   Collection Time: 07/08/18 12:36 AM   Specimen: Urine, Clean Catch  Result Value Ref Range Status   Specimen Description   Final    URINE, CLEAN CATCH Performed at [facility], [address], [place], [place] [address]    Special Requests   Final    Normal Performed at [facility], [address], [place], [place] [address]    Culture (A)  Final    70,000 COLONIES/mL LACTOBACILLUS SPECIES Standardized susceptibility testing for this organism is not available. Performed at [facility], [place] [address] [place], [place] [address]    Report Status 07/09/2018 FINAL  Final  Blood Culture (routine x 2)     Status: None (Preliminary result)   Collection Time: 07/09/18 10:00 AM   Specimen: BLOOD RIGHT ARM  Result Value Ref Range Status   Specimen Description BLOOD RIGHT ARM DRAWN BY RN  Final   Special Requests   Final    BOTTLES DRAWN AEROBIC AND ANAEROBIC Blood Culture adequate volume   Culture   Final    NO GROWTH 4 DAYS Performed at [facility], [address] [place], [place] [address]    Report Status PENDING  Incomplete  Blood Culture (routine x 2)     Status: None (Preliminary result)   Collection Time: 07/09/18 10:10 AM   Specimen: BLOOD LEFT ARM  Result Value Ref Range Status   Specimen Description BLOOD LEFT ARM DRAWN BY RN  Final   Special Requests   Final    BOTTLES DRAWN AEROBIC AND ANAEROBIC Blood Culture results may not be optimal due to an inadequate volume of blood received in culture bottles   Culture   Final    NO GROWTH 4 DAYS Performed at [facility], [address] [place],  [address]    Report Status PENDING  Incomplete      Radiology Studies: No results found.   Marzetta Board, MD, PhD [facility]  Contact via  [email or web address]  [place] P: [phone]  F: [phone]

## 2018-07-14 NOTE — Progress Notes (Signed)
Patient was weaned to room air at the beginning of 1900 shift. Has remained of RA and sats remain 95-98%. Patient has some SOB with ambulation but states it is improving. Will continue to monitor.

## 2018-07-15 ENCOUNTER — Inpatient Hospital Stay (HOSPITAL_COMMUNITY): Payer: 59

## 2018-07-15 DIAGNOSIS — Z85118 Personal history of other malignant neoplasm of bronchus and lung: Secondary | ICD-10-CM

## 2018-07-15 LAB — CULTURE, BLOOD (ROUTINE X 2)
Culture: NO GROWTH
Culture: NO GROWTH
Special Requests: ADEQUATE

## 2018-07-15 LAB — CBC
HCT: 39 % (ref 36.0–46.0)
Hemoglobin: 13 g/dL (ref 12.0–15.0)
MCH: 28.1 pg (ref 26.0–34.0)
MCHC: 33.3 g/dL (ref 30.0–36.0)
MCV: 84.4 fL (ref 80.0–100.0)
Platelets: 460 10*3/uL — ABNORMAL HIGH (ref 150–400)
RBC: 4.62 MIL/uL (ref 3.87–5.11)
RDW: 12.4 % (ref 11.5–15.5)
WBC: 7.3 10*3/uL (ref 4.0–10.5)
nRBC: 0 % (ref 0.0–0.2)

## 2018-07-15 LAB — COMPREHENSIVE METABOLIC PANEL
ALT: 22 U/L (ref 0–44)
AST: 14 U/L — ABNORMAL LOW (ref 15–41)
Albumin: 3 g/dL — ABNORMAL LOW (ref 3.5–5.0)
Alkaline Phosphatase: 94 U/L (ref 38–126)
Anion gap: 14 (ref 5–15)
BUN: 21 mg/dL — ABNORMAL HIGH (ref 6–20)
CO2: 24 mmol/L (ref 22–32)
Calcium: 8.8 mg/dL — ABNORMAL LOW (ref 8.9–10.3)
Chloride: 98 mmol/L (ref 98–111)
Creatinine, Ser: 0.65 mg/dL (ref 0.44–1.00)
GFR calc Af Amer: >60 mL/min (ref >60)
GFR calc non Af Amer: >60 mL/min (ref >60)
Glucose, Bld: 218 mg/dL — ABNORMAL HIGH (ref 70–99)
Potassium: 4.4 mmol/L (ref 3.5–5.1)
Sodium: 136 mmol/L (ref 135–145)
Total Bilirubin: 0.7 mg/dL (ref 0.3–1.2)
Total Protein: 6.9 g/dL (ref 6.5–8.1)

## 2018-07-15 LAB — LACTATE DEHYDROGENASE: LDH: 249 U/L — ABNORMAL HIGH (ref 98–192)

## 2018-07-15 LAB — D-DIMER, QUANTITATIVE: D-Dimer, Quant: 1.6 ug/mL-FEU — ABNORMAL HIGH (ref 0.00–0.50)

## 2018-07-15 LAB — FERRITIN: Ferritin: 197 ng/mL (ref 11–307)

## 2018-07-15 LAB — GLUCOSE, CAPILLARY: Glucose-Capillary: 263 mg/dL — ABNORMAL HIGH (ref 70–99)

## 2018-07-15 LAB — C-REACTIVE PROTEIN: CRP: 5.2 mg/dL — ABNORMAL HIGH (ref <1.0)

## 2018-07-15 MED ORDER — HYDROCODONE-HOMATROPINE 5-1.5 MG/5ML PO SYRP: 5.0000 mL | ORAL_SOLUTION | ORAL | 0 refills | Status: DC | PRN

## 2018-07-15 MED ORDER — HYDROCODONE-ACETAMINOPHEN 5-325 MG PO TABS: 1.0000 | ORAL_TABLET | ORAL | 0 refills | Status: DC | PRN

## 2018-07-15 MED ORDER — GUAIFENESIN-DM 100-10 MG/5ML PO SYRP: 15.0000 mL | ORAL_SOLUTION | ORAL | 0 refills | Status: DC | PRN

## 2018-07-15 NOTE — Discharge Instructions (Signed)
Follow with Shawnee Knapp, MD in 5-7 days  Please get a complete blood count and chemistry panel checked by your Primary MD at your next visit, and again as instructed by your Primary MD. Please get your medications reviewed and adjusted by your Primary MD.  Please request your Primary MD to go over all Hospital Tests and Procedure/Radiological results at the follow up, please get all Hospital records sent to your Prim MD by signing hospital release before you go home.  In some cases, there will be blood work, cultures and biopsy results pending at the time of your discharge. Please request that your primary care M.D. goes through all the records of your hospital data and follows up on these results.  If you had Pneumonia of Lung problems at the Hospital: Please get a 2 view Chest X ray done in 6-8 weeks after hospital discharge or sooner if instructed by your Primary MD.  If you have Congestive Heart Failure: Please call your Cardiologist or Primary MD anytime you have any of the following symptoms:  1) 3 pound weight gain in 24 hours or 5 pounds in 1 week  2) shortness of breath, with or without a dry hacking cough  3) swelling in the hands, feet or stomach  4) if you have to sleep on extra pillows at night in order to breathe  Follow cardiac low salt diet and 1.5 lit/day fluid restriction.  If you have diabetes Accuchecks 4 times/day, Once in AM empty stomach and then before each meal. Log in all results and show them to your primary doctor at your next visit. If any glucose reading is under 80 or above 300 call your primary MD immediately.  If you have Seizure/Convulsions/Epilepsy: Please do not drive, operate heavy machinery, participate in activities at heights or participate in high speed sports until you have seen by Primary MD or a Neurologist and advised to do so again. Per [facility] statutes, patients with seizures are not allowed to drive until they have been  seizure-free for six months.  Use caution when using heavy equipment or power tools. Avoid working on ladders or at heights. Take showers instead of baths. Ensure the water temperature is not too high on the home water heater. Do not go swimming alone. Do not lock yourself in a room alone (i.e. bathroom). When caring for infants or small children, sit down when holding, feeding, or changing them to minimize risk of injury to the child in the event you have a seizure. Maintain good sleep hygiene. Avoid alcohol.   If you had Gastrointestinal Bleeding: Please ask your Primary MD to check a complete blood count within one week of discharge or at your next visit. Your endoscopic/colonoscopic biopsies that are pending at the time of discharge, will also need to followed by your Primary MD.  Get Medicines reviewed and adjusted. Please take all your medications with you for your next visit with your Primary MD  Please request your Primary MD to go over all hospital tests and procedure/radiological results at the follow up, please ask your Primary MD to get all Hospital records sent to his/her office.  If you experience worsening of your admission symptoms, develop shortness of breath, life threatening emergency, suicidal or homicidal thoughts you must seek medical attention immediately by calling 911 or calling your MD immediately  if symptoms less severe.  You must read complete instructions/literature along with all the possible adverse reactions/side effects for all the Medicines you  take and that have been prescribed to you. Take any new Medicines after you have completely understood and accpet all the possible adverse reactions/side effects.   Do not drive or operate heavy machinery when taking Pain medications.   Do not take more than prescribed Pain, Sleep and Anxiety Medications  Special Instructions: If you have smoked or chewed Tobacco  in the last 2 yrs please stop smoking, stop any regular  Alcohol  and or any Recreational drug use.  Wear Seat belts while driving.  Please note You were cared for by a hospitalist during your hospital stay. If you have any questions about your discharge medications or the care you received while you were in the hospital after you are discharged, you can call the unit and asked to speak with the hospitalist on call if the hospitalist that took care of you is not available. Once you are discharged, your primary care physician will handle any further medical issues. Please note that NO REFILLS for any discharge medications will be authorized once you are discharged, as it is imperative that you return to your primary care physician (or establish a relationship with a primary care physician if you do not have one) for your aftercare needs so that they can reassess your need for medications and monitor your lab values.  You can reach the hospitalist office at phone [phone] or fax [phone]   If you do not have a primary care physician, you can call [phone] for a physician referral.  Activity: As tolerated with Full fall precautions use walker/cane & assistance as needed    Diet: low sodium, heart healthy  Disposition Home   People who have recovered from the coronavirus, can help a person still fighting the virus and potentially help them recover by giving them convalescent plasma.  What is COVID-19 convalescent plasma? Convalescent plasma (CPP) is plasma collected from people who have recovered from the coronavirus. People who recover from coronavirus infection have developed antibodies to the virus that remain in the plasma portion of their blood. Transfusing the plasma that contains the antibodies into a person still fighting the virus can provide a boost to the patients immune system and potentially help them recover. The experimental treatment is approved by the FDA to be used on an emergency basis and is called COVID-19 convalescent  plasma." Critically ill patients who meet the FDA criteria to receive this therapy can be treated for life-threatening COVID-19.  Can I donate convalescent plasma or blood after being diagnosed with COVID-19? Donors must meet all the required screening criteria for blood donation and the additional FDA criteria, as follows:   Prior diagnosis of COVID-19 documented by an FDA approved laboratory test   A positive diagnostic test at the time of illness OR   A positive serological test for SARS-CoV-2 antibodies after recovery  Complete resolution of symptoms at least 14 days prior to donation and a documented negative COVID-19 FDA approved test OR   Complete resolution of symptoms at least 28 days prior to donation As a part of your pre-donation process you will be required to provide your COVID-19 test result(s).  Where can I donate COVID-19 convalescent plasma? You can complete the pre-donation form at [email or web address] and a donor specialist will be in contact within 24-48 hours.  What is the process for donating COVID-19 Convalescent Plasma?  1. The first step in the donation process is to obtain a copy of your test results or a letter from the testing facility notifying  you of your positive result and the date it was taken 2. Complete the pre-donation form at [email or web address] 3. A representative will be in contact within 24-48 hours to provide additional insight into the process 4. Once the process begins, [facility] will ensure you   Meet the required screening criteria for blood donation   Have the required test results   Meet the criteria for resolution of your symptoms   Complete resolution of symptoms at least 14 days prior to donation and a documented negative COVID-19 FDA approved test OR   Complete resolution of symptoms at least 28 days prior to donation 5. [facility] will then schedule a collection date and time to collect your COVID-19 convalescent plasma  Is the  COVID-19 convalescent plasma donation safe? It is safe to donate COVID-19 convalescent plasma and done in the same way that thousands of blood donors donate blood products every day.  My family member or friend is being treated for COVID-19. Can I donate to help them if I am eligible? A directed donation will need to be arranged in conjunction with your family members care provider.  The care provider will determine whether COVID-19 convalescent plasma is the best course of treatment   A directed donation must be arranged through the Clinician Referral page that can be found at [email or web address]   The clinician will need to provide [facility] with information about both you and the patient for which the donation is directed   Please note there are additional qualifications that are required such as your blood type and the patients blood type We encourage you to consider beginning the pre-registration process even if your loved one will not be receiving COVID-19 convalescent plasma as a part of their medical treatment. The need for COVID-19 convalescent plasma increases daily and your donated COVID-19 convalescent plasma could be life-saving for another COVID-19 patient.  Please go to the website [email or web address] if you would like to consider volunteer for plasma donation.       Person Under Monitoring Name: [facility]  Location: [address] [place] [address]   Infection Prevention Recommendations for Individuals Confirmed to have, or Being Evaluated for, 2019 Novel Coronavirus (COVID-19) Infection Who Receive Care at Home  Individuals who are confirmed to have, or are being evaluated for, COVID-19 should follow the prevention steps below until a healthcare provider or local or state health department says they can return to normal activities.  Stay home except to get medical care You should restrict activities outside your home, except for getting  medical care. Do not go to work, school, or public areas, and do not use public transportation or taxis.  Call ahead before visiting your doctor Before your medical appointment, call the healthcare provider and tell them that you have, or are being evaluated for, COVID-19 infection. This will help the healthcare providers office take steps to keep other people from getting infected. Ask your healthcare provider to call the local or state health department.  Monitor your symptoms Seek prompt medical attention if your illness is worsening (e.g., difficulty breathing). Before going to your medical appointment, call the healthcare provider and tell them that you have, or are being evaluated for, COVID-19 infection. Ask your healthcare provider to call the local or state health department.  Wear a facemask You should wear a facemask that covers your nose and mouth when you are in the same room with other people and when you visit a healthcare provider. People who live with  or visit you should also wear a facemask while they are in the same room with you.  Separate yourself from other people in your home As much as possible, you should stay in a different room from other people in your home. Also, you should use a separate bathroom, if available.  Avoid sharing household items You should not share dishes, drinking glasses, cups, eating utensils, towels, bedding, or other items with other people in your home. After using these items, you should wash them thoroughly with soap and water.  Cover your coughs and sneezes Cover your mouth and nose with a tissue when you cough or sneeze, or you can cough or sneeze into your sleeve. Throw used tissues in a lined trash can, and immediately wash your hands with soap and water for at least 20 seconds or use an alcohol-based hand rub.  Wash your [facility] your hands often and thoroughly with soap and water for at least 20 seconds. You can use an  alcohol-based hand sanitizer if soap and water are not available and if your hands are not visibly dirty. Avoid touching your eyes, nose, and mouth with unwashed hands.   Prevention Steps for Caregivers and Household Members of Individuals Confirmed to have, or Being Evaluated for, COVID-19 Infection Being Cared for in the Home  If you live with, or provide care at home for, a person confirmed to have, or being evaluated for, COVID-19 infection please follow these guidelines to prevent infection:  Follow healthcare providers instructions Make sure that you understand and can help the patient follow any healthcare provider instructions for all care.  Provide for the patients basic needs You should help the patient with basic needs in the home and provide support for getting groceries, prescriptions, and other personal needs.  Monitor the patients symptoms If they are getting sicker, call his or her medical provider and tell them that the patient has, or is being evaluated for, COVID-19 infection. This will help the healthcare providers office take steps to keep other people from getting infected. Ask the healthcare provider to call the local or state health department.  Limit the number of people who have contact with the patient  If possible, have only one caregiver for the patient.  Other household members should stay in another home or place of residence. If this is not possible, they should stay  in another room, or be separated from the patient as much as possible. Use a separate bathroom, if available.  Restrict visitors who do not have an essential need to be in the home.  Keep older adults, very young children, and other sick people away from the patient Keep older adults, very young children, and those who have compromised immune systems or chronic health conditions away from the patient. This includes people with chronic heart, lung, or kidney conditions, diabetes, and  cancer.  Ensure good ventilation Make sure that shared spaces in the home have good air flow, such as from an air conditioner or an opened window, weather permitting.  Wash your hands often  Wash your hands often and thoroughly with soap and water for at least 20 seconds. You can use an alcohol based hand sanitizer if soap and water are not available and if your hands are not visibly dirty.  Avoid touching your eyes, nose, and mouth with unwashed hands.  Use disposable paper towels to dry your hands. If not available, use dedicated cloth towels and replace them when they become wet.  Wear  a facemask and gloves  Wear a disposable facemask at all times in the room and gloves when you touch or have contact with the patients blood, body fluids, and/or secretions or excretions, such as sweat, saliva, sputum, nasal mucus, vomit, urine, or feces.  Ensure the mask fits over your nose and mouth tightly, and do not touch it during use.  Throw out disposable facemasks and gloves after using them. Do not reuse.  Wash your hands immediately after removing your facemask and gloves.  If your personal clothing becomes contaminated, carefully remove clothing and launder. Wash your hands after handling contaminated clothing.  Place all used disposable facemasks, gloves, and other waste in a lined container before disposing them with other household waste.  Remove gloves and wash your hands immediately after handling these items.  Do not share dishes, glasses, or other household items with the patient  Avoid sharing household items. You should not share dishes, drinking glasses, cups, eating utensils, towels, bedding, or other items with a patient who is confirmed to have, or being evaluated for, COVID-19 infection.  After the person uses these items, you should wash them thoroughly with soap and water.  Wash laundry thoroughly  Immediately remove and wash clothes or bedding that have blood, body  fluids, and/or secretions or excretions, such as sweat, saliva, sputum, nasal mucus, vomit, urine, or feces, on them.  Wear gloves when handling laundry from the patient.  Read and follow directions on labels of laundry or clothing items and detergent. In general, wash and dry with the warmest temperatures recommended on the label.  Clean all areas the individual has used often  Clean all touchable surfaces, such as counters, tabletops, doorknobs, bathroom fixtures, toilets, phones, keyboards, tablets, and bedside tables, every day. Also, clean any surfaces that may have blood, body fluids, and/or secretions or excretions on them.  Wear gloves when cleaning surfaces the patient has come in contact with.  Use a diluted bleach solution (e.g., dilute bleach with 1 part bleach and 10 parts water) or a household disinfectant with a label that says EPA-registered for coronaviruses. To make a bleach solution at home, add 1 tablespoon of bleach to 1 quart (4 cups) of water. For a larger supply, add  cup of bleach to 1 gallon (16 cups) of water.  Read labels of cleaning products and follow recommendations provided on product labels. Labels contain instructions for safe and effective use of the cleaning product including precautions you should take when applying the product, such as wearing gloves or eye protection and making sure you have good ventilation during use of the product.  Remove gloves and wash hands immediately after cleaning.  Monitor yourself for signs and symptoms of illness Caregivers and household members are considered close contacts, should monitor their health, and will be asked to limit movement outside of the home to the extent possible. Follow the monitoring steps for close contacts listed on the symptom monitoring form.   ? If you have additional questions, contact your local health department or call the epidemiologist on call at [phone] (available 24/7). ? This  guidance is subject to change. For the most up-to-date guidance from [facility], please refer to their website: [email or web address]

## 2018-07-15 NOTE — Evaluation (Signed)
Physical Therapy Evaluation Patient Details Name: Crystal Bryant MRN: [id number] DOB: [birth year] Today's Date: 07/15/2018   History of Present Illness  -year-old female with history of chronic systolic CHF with most recent EF of 40%, non-small cell lung cancer status post lobectomy and VATS at [facility] in 2018, hypertension, type 2 diabetes mellitus, who comes to the hospital with chief complaint of shortness of breath, fever, general malaise and decreased appetite.  Patient was initially evaluated on 07/07/2018 in the ED for similar symptoms, was positive for COVID-19, but was not hypoxic and discharged home.  She returned and was admitted on 6/10 due to persistent and worsening symptoms.  She was found to be hypoxic, chest x-ray on admission showed multifocal pneumonia and was admitted to the hospital.  Clinical Impression  The patient is mobilizing in room independently. Patient emotional and states"I'm scared". Patient reports children available to assist  Upon DC. Patient's SaO2 after ambulating x 400' 95% on RA.Marland Kitchen Pt admitted with above diagnosis. Pt currently with functional limitations due to the deficits listed below (see PT Problem List).  Pt will benefit from skilled PT to increase their independence and safety with mobility to allow discharge to the venue listed below.       Follow Up Recommendations No PT follow up    Equipment Recommendations  None recommended by PT    Recommendations for Other Services       Precautions / Restrictions Precautions Precautions: None      Mobility  Bed Mobility Overal bed mobility: Independent                Transfers Overall transfer level: Independent                  Ambulation/Gait Ambulation/Gait assistance: Supervision Gait Distance (Feet): 400 Feet Assistive device: None Gait Pattern/deviations: WFL(Within Functional Limits)   Gait velocity interpretation: <1.31 ft/sec, indicative of household ambulator General  Gait Details: gait slow and steady  Stairs            Wheelchair Mobility    Modified Rankin (Stroke Patients Only)       Balance                                             Pertinent Vitals/Pain Pain Assessment: No/denies pain    Home Living Family/patient expects to be discharged to:: Private residence Living Arrangements: Alone Available Help at Discharge: Family;Available PRN/intermittently Type of Home: Apartment Home Access: Stairs to enter Entrance Stairs-Rails: Psychiatric nurse of Steps: 3 flights Home Layout: One level Home Equipment: None      Prior Function Level of Independence: Independent         Comments: still works     Journalist, newspaper        Extremity/Trunk Assessment   Upper Extremity Assessment Upper Extremity Assessment: Overall WFL for tasks assessed    Lower Extremity Assessment Lower Extremity Assessment: Overall WFL for tasks assessed    Cervical / Trunk Assessment Cervical / Trunk Assessment: Normal  Communication   Communication: No difficulties  Cognition Arousal/Alertness: Awake/alert Behavior During Therapy: WFL for tasks assessed/performed Overall Cognitive Status: Within Functional Limits for tasks assessed                                 General Comments: pt  emotional and states" I'm scared"      General Comments      Exercises     Assessment/Plan    PT Assessment Patient needs continued PT services  PT Problem List Decreased activity tolerance;Decreased mobility       PT Treatment Interventions Gait training;Stair training    PT Goals (Current goals can be found in the Care Plan section)  Acute Rehab PT Goals Patient Stated Goal: to get well PT Goal Formulation: With patient Time For Goal Achievement: 07/29/18 Potential to Achieve Goals: Good    Frequency Min 3X/week   Barriers to discharge        Co-evaluation                AM-PAC PT "6 Clicks" Mobility  Outcome Measure Help needed turning from your back to your side while in a flat bed without using bedrails?: None Help needed moving from lying on your back to sitting on the side of a flat bed without using bedrails?: None Help needed moving to and from a bed to a chair (including a wheelchair)?: None Help needed standing up from a chair using your arms (e.g., wheelchair or bedside chair)?: None Help needed to walk in hospital room?: None Help needed climbing 3-5 steps with a railing? : A Little 6 Click Score: 23    End of Session   Activity Tolerance: Patient tolerated treatment well Patient left: in chair;with call bell/phone within reach Nurse Communication: Mobility status PT Visit Diagnosis: Difficulty in walking, not elsewhere classified (R26.2)    Time: 9201-0071 PT Time Calculation (min) (ACUTE ONLY): 19 min   Charges:   PT Evaluation $PT Eval Low Complexity: [place] Pager [phone] Office [phone]   Claretha Cooper 07/15/2018, 9:23 AM

## 2018-07-15 NOTE — Progress Notes (Signed)
Pt was noted to have a CBG = 401 @ 2030pm. Pt had just finished consuming 240cc grape juice.Therefore that was taken into account for elevated glucose. SS coverage of 15units given

## 2018-07-15 NOTE — Discharge Summary (Signed)
Physician Discharge Summary  [place] TSV:[id number] DOB: [birth year] DOA: 07/09/2018  PCP: Shawnee Knapp, MD  Admit date: 07/09/2018 Discharge date: 07/15/2018  Admitted From: home Disposition:  home  Recommendations for Outpatient Follow-up:  1. Follow up with PCP in 1-2 weeks  Home Health: none Equipment/Devices: none  Discharge Condition: stable CODE STATUS: Full code Diet recommendation: regular  HPI: Per admitting MD, Crystal Bryant is a 48 y.o. female with a past medical history significant for anxiety, hypertension, diabetes mellitus type 2, systolic HF (EF 92%) and non-small cell and right upper lobe treated with right upper lobectomy via VATS at [facility]; who presented to the hospital secondary to worsening shortness of breath, fever, general malaise and decreased appetite.  Patient was seen 2 days ago in the emergency department secondary to similar symptoms (less severe at that time) and was diagnosed with COVID-19; during that visit patient didn't has hypoxia and was felt to be adequate to go home with supportive management and isolation.  48 hours later patient continue experiencing high-grade temperatures, chills, decreased appetite, generalized weakness/fatigue, body aches, anosmia, ageusia and worsening breathing.  She returned to the emergency department for further evaluation and management, and was found to be hypoxic in the mid 80s on room air.  Patient denies chest pain, dysuria, hematuria, headaches, focal weakness, abdominal pain, nausea, vomiting, melena, hematochezia, blurred vision or any other complaints. In the ED chest x-ray demonstrated multifocal infiltrates affecting left upper/mid lobes and also right lower lobes.  Normal lactic acid, ferritin level 248, Fibrinogen 601, CRP 30, negative troponin, d-dimer 0.72; CBGs in the 306 range, normal LFTs and normal renal function.  WBC is 10.7.  TRH contacted to admit patient for acute respiratory failure with  hypoxia and meeting sepsis criteria due to COVID-19 infection. IVF's started, steroids given.   Hospital Course: Principal Problem Acute Hypoxic Respiratory Failure due to Covid-19 Viral Illness, viral pneumonia, sepsis -Patient was admitted to the hospital with hypoxic respiratory failure in the setting of COVID-19 viral pneumonia.  She had elevated inflammatory markers and was placed on steroids along with Remdesivir.  She finished treatment while hospitalized, gradually improved, and she was able to be weaned off to room air.  On the day of discharge, she is able to ambulate in the hallway, significantly improved, maintaining good O2 sats on room air.  Repeat chest x-ray on day of discharge showed significant improvement in her bilateral infiltrates.  She will be discharged home in stable condition, and was advised to follow-up with PCP as an outpatient within 1 to 2 weeks.  Active Problems Chronic systolic CHF -euvolemic, resume home medications Type 2 diabetes mellitus -resume home medications Hypertension -resume home medications History of lung cancer -Monitor as an outpatient Anxiety -Continue home Xanax    Discharge Diagnoses:  Active Problems:   Essential hypertension   History of malignant neoplasm of upper lobe bronchus or lung   Acute respiratory disease due to COVID-19 virus   Chronic systolic HF (heart failure) ([place])     Discharge Instructions   Allergies as of 07/15/2018   No Known Allergies     Medication List    TAKE these medications   acetaminophen 500 MG tablet Commonly known as: TYLENOL Take 1,000 mg by mouth every 6 (six) hours as needed for mild pain or moderate pain.   albuterol 108 (90 Base) MCG/ACT inhaler Commonly known as: VENTOLIN HFA Inhale 2 puffs into the lungs every 4 (four) hours as needed for wheezing or shortness  of breath.   ALPRAZolam 0.5 MG tablet Commonly known as: XANAX Take 1 tablet (0.5 mg total) by mouth at bedtime as needed for  anxiety.   carvedilol 25 MG tablet Commonly known as: COREG Take 1.5 tablets (37.5 mg total) by mouth 2 (two) times daily.   fluticasone 50 MCG/ACT nasal spray Commonly known as: FLONASE Place 2 sprays into both nostrils daily.   furosemide 40 MG tablet Commonly known as: LASIX Take 1 tablet (40 mg total) by mouth daily.   guaiFENesin-dextromethorphan 100-10 MG/5ML syrup Commonly known as: ROBITUSSIN DM Take 15 mLs by mouth every 4 (four) hours as needed for cough.   HYDROcodone-acetaminophen 5-325 MG tablet Commonly known as: NORCO/VICODIN Take 1 tablet by mouth every 4 (four) hours as needed (for headache or severe pain.).   HYDROcodone-homatropine 5-1.5 MG/5ML syrup Commonly known as: HYCODAN Take 5 mLs by mouth every 4 (four) hours as needed for cough.   loratadine-pseudoephedrine 5-120 MG tablet Commonly known as: Claritin-D 12 Hour Take 1 tablet by mouth 2 (two) times daily.   potassium chloride SA 20 MEQ tablet Commonly known as: K-DUR Take 1 tablet (20 mEq total) by mouth daily.   sacubitril-valsartan 49-51 MG Commonly known as: Entresto Take 1 tablet by mouth 2 (two) times daily.   sitaGLIPtin-metformin 50-1000 MG tablet Commonly known as: Janumet Take 1 tablet 2 (two) times daily with a meal by mouth.   triamcinolone cream 0.1 % Commonly known as: KENALOG Apply 1 application topically 2 (two) times daily.      Follow-up Information    Shawnee Knapp, MD. Schedule an appointment as soon as possible for a visit in 1 week(s).   Specialty: Family Medicine Contact information: [place] [address] [phone]        Nahser, Wonda Cheng, MD .   Specialty: Cardiology Contact information: [address] [place] [address] [phone]           Consultations:  None   Procedures/Studies:  Dg Chest Port 1 View  Result Date: 07/15/2018 CLINICAL DATA:  Dyspnea.  COVID-19. EXAM: PORTABLE CHEST 1 VIEW COMPARISON:   07/12/2018, 07/09/2018 and 07/07/2018 FINDINGS: There has been marked improvement in the bilateral pulmonary infiltrates since the study of 07/12/2018. There are small areas of residual infiltrate at the left lung base and peripherally in the left midzone. There is an area of postsurgical scarring at the right lung base laterally but the infiltrate has almost completely resolved. Heart size and vascularity are normal. No effusions. No bone abnormality. IMPRESSION: Marked improvement in the bilateral pulmonary infiltrates as described. Electronically Signed   By: Lorriane Shire M.D.   On: 07/15/2018 08:11   Dg Chest Port 1 View  Result Date: 07/12/2018 CLINICAL DATA:  Dyspnea. COVID-19 positive. EXAM: PORTABLE CHEST 1 VIEW COMPARISON:  07/09/2018 FINDINGS: The cardiac silhouette is accentuated by low lung volumes and AP technique. Right basilar airspace opacity has mildly improved. Airspace consolidation in the left lung base with air bronchograms has mildly worsened. Left mid to upper lung. There is hazy opacity in the left mid lung which may have mildly progressed while hazy left upper lung opacity appears mildly improved. No sizable pleural effusion or pneumothorax is identified. IMPRESSION: Bilateral lung opacities consistent with pneumonia with mixed interval changes as above. Electronically Signed   By: Logan Bores M.D.   On: 07/12/2018 11:18   Dg Chest Port 1 View  Result Date: 07/09/2018 CLINICAL DATA:  Shortness of breath. History  of lung carcinoma. Positive test for COVID-19 EXAM: PORTABLE CHEST 1 VIEW COMPARISON:  July 07, 2018 FINDINGS: There is airspace consolidation in the right lower lobe. There is more ill-defined airspace opacity in the left upper lobe and left base regions. Heart size and pulmonary vascularity are normal. No adenopathy. No bone lesions. IMPRESSION: Multifocal pneumonia with ground-glass type airspace opacity throughout much of the left lung. There is consolidation as well as  airspace opacity in the right lower lung region. Stable cardiac silhouette.  No evident adenopathy. Electronically Signed   By: Lowella Grip III M.D.   On: 07/09/2018 10:02   Dg Chest Portable 1 View  Result Date: 07/07/2018 CLINICAL DATA:  48 year old female with headache and fever. History of right lung cancer and prior wedge resection. Testing for COVID-19 pending. EXAM: PORTABLE CHEST 1 VIEW COMPARISON:  04/26/2018 and earlier. FINDINGS: New patchy and indistinct bilateral lower lung and peripheral pulmonary opacities. Similar lung volumes. Mediastinal contours remain normal. Visualized tracheal air column is within normal limits. No pneumothorax, pulmonary edema or pleural effusion. Paucity of bowel gas in the upper abdomen. No osseous abnormality identified. IMPRESSION: Patchy and indistinct bilateral lower lung opacity suspicious for acute viral/atypical respiratory infection. No pleural effusion. Electronically Signed   By: Genevie Ann M.D.   On: 07/07/2018 21:40     Subjective: - no chest pain, shortness of breath, no abdominal pain, nausea or vomiting.   Discharge Exam: BP 112/81 (BP Location: Left Arm)    Pulse (!) 104    Temp 98.2 F (36.8 C) (Oral)    Resp (!) 26    Ht 5\' 5"  (1.651 m)    Wt 61.9 kg    SpO2 93%    BMI 22.70 kg/m   General: Pt is alert, awake, not in acute distress Cardiovascular: RRR, S1/S2 +, no rubs, no gallops Respiratory: CTA bilaterally, no wheezing, no rhonchi Abdominal: Soft, NT, ND, bowel sounds + Extremities: no edema, no cyanosis    The results of significant diagnostics from this hospitalization (including imaging, microbiology, ancillary and laboratory) are listed below for reference.     Microbiology: Recent Results (from the past 240 hour(s))  SARS Coronavirus 2 (CEPHEID- Performed in [place] hospital lab), Hosp Order     Status: Abnormal   Collection Time: 07/07/18  9:10 PM   Specimen: Nasopharyngeal Swab  Result Value Ref Range Status     SARS Coronavirus 2 POSITIVE (A) NEGATIVE Final    Comment: RESULT CALLED TO, READ BACK BY AND VERIFIED WITH: H BRYANT,PA @2258  07/07/18 MKELLY (NOTE) If result is NEGATIVE SARS-CoV-2 target nucleic acids are NOT DETECTED. The SARS-CoV-2 RNA is generally detectable in upper and lower  respiratory specimens during the acute phase of infection. The lowest  concentration of SARS-CoV-2 viral copies this assay can detect is 250  copies / mL. A negative result does not preclude SARS-CoV-2 infection  and should not be used as the sole basis for treatment or other  patient management decisions.  A negative result may occur with  improper specimen collection / handling, submission of specimen other  than nasopharyngeal swab, presence of viral mutation(s) within the  areas targeted by this assay, and inadequate number of viral copies  (<250 copies / mL). A negative result must be combined with clinical  observations, patient history, and epidemiological information. If result is POSITIVE SARS-CoV-2 target nucleic acids are DETECTED. The  SARS-CoV-2 RNA is generally detectable in upper and lower  respiratory specimens during the acute phase  of infection.  Positive  results are indicative of active infection with SARS-CoV-2.  Clinical  correlation with patient history and other diagnostic information is  necessary to determine patient infection status.  Positive results do  not rule out bacterial infection or co-infection with other viruses. If result is PRESUMPTIVE POSTIVE SARS-CoV-2 nucleic acids MAY BE PRESENT.   A presumptive positive result was obtained on the submitted specimen  and confirmed on repeat testing.  While 2019 novel coronavirus  (SARS-CoV-2) nucleic acids may be present in the submitted sample  additional confirmatory testing may be necessary for epidemiological  and / or clinical management purposes  to differentiate between  SARS-CoV-2 and other Sarbecovirus currently  known to infect humans.  If clinically indicated additional testing with an alternate test  methodology [phone]) is a dvised. The SARS-CoV-2 RNA is generally  detectable in upper and lower respiratory specimens during the acute  phase of infection. The expected result is Negative. Fact Sheet for Patients:  StrictlyIdeas.no Fact Sheet for Healthcare Providers: BankingDealers.co.za This test is not yet approved or cleared by the [place] FDA and has been authorized for detection and/or diagnosis of SARS-CoV-2 by FDA under an Emergency Use Authorization (EUA).  This EUA will remain in effect (meaning this test can be used) for the duration of the COVID-19 declaration under Section 564(b)(1) of the Act, 21 U.S.C. section 360bbb-3(b)(1), unless the authorization is terminated or revoked sooner. Performed at [facility], [address], [place], [place] [address]   Culture, blood (routine x 2)     Status: None   Collection Time: 07/07/18  9:34 PM   Specimen: BLOOD  Result Value Ref Range Status   Specimen Description BLOOD LEFT ANTECUBITAL  Final   Special Requests   Final    BOTTLES DRAWN AEROBIC AND ANAEROBIC Blood Culture adequate volume   Culture   Final    NO GROWTH 5 DAYS Performed at [facility], [address] [place], [place] [address]    Report Status 07/12/2018 FINAL  Final  Culture, blood (routine x 2)     Status: None   Collection Time: 07/07/18  9:35 PM   Specimen: BLOOD  Result Value Ref Range Status   Specimen Description BLOOD RIGHT ANTECUBITAL  Final   Special Requests   Final    BOTTLES DRAWN AEROBIC AND ANAEROBIC Blood Culture adequate volume   Culture   Final    NO GROWTH 5 DAYS Performed at [facility], [address], [place], [place] [address]    Report Status 07/12/2018 FINAL  Final  Urine Culture     Status: Abnormal   Collection Time: 07/08/18 12:36 AM   Specimen: Urine, Clean Catch  Result Value  Ref Range Status   Specimen Description   Final    URINE, CLEAN CATCH Performed at [facility], [address], [place], [place] [address]    Special Requests   Final    Normal Performed at [facility], [address], [place], [place] [address]    Culture (A)  Final    70,000 COLONIES/mL LACTOBACILLUS SPECIES Standardized susceptibility testing for this organism is not available. Performed at [facility], [place] [address] [place],  [address]    Report Status 07/09/2018 FINAL  Final  Blood Culture (routine x 2)     Status: None   Collection Time: 07/09/18 10:00 AM   Specimen: BLOOD RIGHT ARM  Result Value Ref Range Status   Specimen Description BLOOD RIGHT ARM DRAWN BY  RN  Final   Special Requests   Final    BOTTLES DRAWN AEROBIC AND ANAEROBIC Blood Culture adequate volume   Culture   Final    NO GROWTH 6 DAYS Performed at [facility], [address] [place], [place] [address]    Report Status 07/15/2018 FINAL  Final  Blood Culture (routine x 2)     Status: None   Collection Time: 07/09/18 10:10 AM   Specimen: BLOOD LEFT ARM  Result Value Ref Range Status   Specimen Description BLOOD LEFT ARM DRAWN BY RN  Final   Special Requests   Final    BOTTLES DRAWN AEROBIC AND ANAEROBIC Blood Culture results may not be optimal due to an inadequate volume of blood received in culture bottles   Culture   Final    NO GROWTH 6 DAYS Performed at [facility], [address] [place], [place] [address]    Report Status 07/15/2018 FINAL  Final     Labs: BNP (last 3 results) Recent Labs    04/26/18 0251 07/09/18 1015  BNP 194.0* 462.7*   Basic Metabolic Panel: Recent Labs  Lab 07/10/18 0300 07/11/18 0400 07/12/18 0835 07/13/18 0310 07/14/18 0542 07/15/18 0408  NA 134* 136 138 138 137 136  K 4.4 4.8 4.6 4.5 3.4* 4.4  CL 102 103 104 102 102 98  CO2 20* 21* 23 24 26 24   GLUCOSE 315* 306* 253* 194* 129* 218*  BUN 22* 32* 35* 28* 22* 21*  CREATININE 0.82 0.79 0.75  0.74 0.60 0.65  CALCIUM 8.7* 9.0 8.7* 8.6* 8.3* 8.8*  MG 2.4  --   --   --   --   --   PHOS 3.7  --   --   --   --   --    Liver Function Tests: Recent Labs  Lab 07/11/18 0400 07/12/18 0835 07/13/18 0310 07/14/18 0542 07/15/18 0408  AST 65* 24 19 16  14*  ALT 41 36 30 23 22   ALKPHOS 114 111 97 87 94  BILITOT 0.4 0.3 0.2* 0.6 0.7  PROT 7.2 6.8 6.6 6.0* 6.9  ALBUMIN 2.8* 2.7* 2.7* 2.6* 3.0*   No results for input(s): LIPASE, AMYLASE in the last 168 hours. No results for input(s): AMMONIA in the last 168 hours. CBC: Recent Labs  Lab 07/09/18 1015  07/11/18 0400 07/12/18 0835 07/13/18 0310 07/14/18 0542 07/15/18 0408  WBC 10.7*   < > 10.9* 8.2 7.0 7.5 7.3  NEUTROABS 9.8*  --   --   --   --   --   --   HGB 13.3   < > 12.0 12.4 12.2 12.7 13.0  HCT 41.4   < > 37.5 37.8 38.9 38.8 39.0  MCV 86.3   < > 85.2 85.5 86.1 84.9 84.4  PLT 235   < > 272 306 359 380 460*   < > = values in this interval not displayed.   Cardiac Enzymes: Recent Labs  Lab 07/09/18 1015  TROPONINI <0.03   BNP: Invalid input(s): POCBNP CBG: Recent Labs  Lab 07/14/18 0749 07/14/18 1158 07/14/18 1602 07/14/18 2043 07/15/18 0810  GLUCAP 115* 209* 178* 401* 263*   D-Dimer Recent Labs    07/14/18 0542 07/15/18 0408  DDIMER 2.16* 1.60*   Hgb A1c No results for input(s): HGBA1C in the last 72 hours. Lipid Profile No results for input(s): CHOL, HDL, LDLCALC, TRIG, CHOLHDL, LDLDIRECT in the last 72 hours. Thyroid function studies No results for input(s): TSH, T4TOTAL, T3FREE,  THYROIDAB in the last 72 hours.  Invalid input(s): FREET3 Anemia work up Recent Labs    07/14/18 0542 07/15/18 0408  FERRITIN 175 197   Urinalysis    Component Value Date/Time   COLORURINE YELLOW 07/08/2018 0015   APPEARANCEUR HAZY (A) 07/08/2018 0015   LABSPEC 1.035 (H) 07/08/2018 0015   PHURINE 5.0 07/08/2018 0015   GLUCOSEU >=500 (A) 07/08/2018 0015   HGBUR NEGATIVE 07/08/2018 0015   BILIRUBINUR NEGATIVE  07/08/2018 0015   BILIRUBINUR negative 01/05/2017 0906   KETONESUR 20 (A) 07/08/2018 0015   PROTEINUR 30 (A) 07/08/2018 0015   UROBILINOGEN 0.2 01/05/2017 0906   NITRITE NEGATIVE 07/08/2018 0015   LEUKOCYTESUR MODERATE (A) 07/08/2018 0015   Sepsis Labs Invalid input(s): PROCALCITONIN,  WBC,  LACTICIDVEN  FURTHER DISCHARGE INSTRUCTIONS:   Get Medicines reviewed and adjusted: Please take all your medications with you for your next visit with your Primary MD   Laboratory/radiological data: Please request your Primary MD to go over all hospital tests and procedure/radiological results at the follow up, please ask your Primary MD to get all Hospital records sent to his/her office.   In some cases, they will be blood work, cultures and biopsy results pending at the time of your discharge. Please request that your primary care M.D. goes through all the records of your hospital data and follows up on these results.   Also Note the following: If you experience worsening of your admission symptoms, develop shortness of breath, life threatening emergency, suicidal or homicidal thoughts you must seek medical attention immediately by calling 911 or calling your MD immediately  if symptoms less severe.   You must read complete instructions/literature along with all the possible adverse reactions/side effects for all the Medicines you take and that have been prescribed to you. Take any new Medicines after you have completely understood and accpet all the possible adverse reactions/side effects.    Do not drive when taking Pain medications or sleeping medications (Benzodaizepines)   Do not take more than prescribed Pain, Sleep and Anxiety Medications. It is not advisable to combine anxiety,sleep and pain medications without talking with your primary care practitioner   Special Instructions: If you have smoked or chewed Tobacco  in the last 2 yrs please stop smoking, stop any regular Alcohol  and or any  Recreational drug use.   Wear Seat belts while driving.   Please note: You were cared for by a hospitalist during your hospital stay. Once you are discharged, your primary care physician will handle any further medical issues. Please note that NO REFILLS for any discharge medications will be authorized once you are discharged, as it is imperative that you return to your primary care physician (or establish a relationship with a primary care physician if you do not have one) for your post hospital discharge needs so that they can reassess your need for medications and monitor your lab values.  Time coordinating discharge: 40 minutes  SIGNED:  Marzetta Board, MD, PhD 07/15/2018, 11:49 AM

## 2018-07-17 ENCOUNTER — Other Ambulatory Visit: Payer: Self-pay | Admitting: *Deleted

## 2018-07-17 DIAGNOSIS — E1165 Type 2 diabetes mellitus with hyperglycemia: Secondary | ICD-10-CM | POA: Insufficient documentation

## 2018-07-17 DIAGNOSIS — E119 Type 2 diabetes mellitus without complications: Secondary | ICD-10-CM | POA: Insufficient documentation

## 2018-07-17 DIAGNOSIS — Z794 Long term (current) use of insulin: Secondary | ICD-10-CM | POA: Insufficient documentation

## 2018-07-17 NOTE — Patient Outreach (Addendum)
[facility]) Care Management  07/17/2018  Avelyn Touch 10/20/1970 [id number]   Transition of care telephone call  Referral received: 07/10/18 Initial outreach: 07/17/18 Insurance: [employer]  Initial unsuccessful telephone call to patient's preferred number in order to complete transition of care assessment; no answer, left HIPAA compliant voicemail message requesting return call.   Objective: Per the electronic medical record, [facility], was admitted to the [facility] from 6/10-6/16/20 for respiratory distress related to + COVID 19 infection.  Comorbidities include: Chronic systolic heart failure, HTN, lung cancer s/p lobectomy, hyperlipidemia, Type 2 DM, and anxiety. She was discharged to home on 07/15/18 without the need for home health services or durable medical equipment per the discharge summary.   Plan: This RNCM will route unsuccessful outreach letter with [place] Management pamphlet and 24 hour Nurse Advice Line Magnet to [place] Management clinical pool to be mailed to patient's home address. This RNCM will attempt another outreach within 4 business days.  Barrington Ellison RN,CCM,CDE [place] Management Coordinator Office Phone [phone] Office Fax [phone]

## 2018-07-22 ENCOUNTER — Ambulatory Visit: Payer: Self-pay | Admitting: *Deleted

## 2018-07-22 ENCOUNTER — Other Ambulatory Visit: Payer: Self-pay | Admitting: *Deleted

## 2018-07-22 NOTE — Patient Outreach (Addendum)
[facility]) Care Management  07/22/2018  Aadhya Bustamante 09-13-1970 [id number]   Transition of care telephone call  Referral received: 07/10/18 Initial outreach: 07/17/18 Insurance: [employer]  Second unsuccessful telephone call to patient's preferred number in order to complete transition of care assessment; no answer, left HIPAA compliant voicemail message requesting return call.   Objective: Per the electronic medical record, [facility], was admitted to the [facility] from 6/10-6/16/20 for respiratory distress related to + COVID 19 infection.  Comorbidities include: Chronic systolic heart failure, HTN, lung cancer s/p lobectomy, hyperlipidemia, Type 2 DM, and anxiety. She was discharged to home on 07/15/18 without the need for home health services or durable medical equipment per the discharge summary.  Per the electronic medical record, [place] has a virtual visit with her primary care provider on 07/23/18 at 9:00 am.   Plan: This RNCM will attempt another outreach within 4 business days.  Barrington Ellison RN,CCM,CDE [place] Management Coordinator Office Phone [phone] Office Fax [phone]

## 2018-07-23 ENCOUNTER — Encounter: Payer: Self-pay | Admitting: Family Medicine

## 2018-07-23 ENCOUNTER — Other Ambulatory Visit: Payer: Self-pay

## 2018-07-23 ENCOUNTER — Telehealth: Payer: Self-pay | Admitting: Family Medicine

## 2018-07-23 ENCOUNTER — Telehealth (INDEPENDENT_AMBULATORY_CARE_PROVIDER_SITE_OTHER): Payer: 59 | Admitting: Family Medicine

## 2018-07-23 VITALS — Temp 98.3°F | Ht 65.0 in | Wt 137.0 lb

## 2018-07-23 DIAGNOSIS — Z902 Acquired absence of lung [part of]: Secondary | ICD-10-CM

## 2018-07-23 DIAGNOSIS — U071 COVID-19: Secondary | ICD-10-CM | POA: Diagnosis not present

## 2018-07-23 DIAGNOSIS — Z85118 Personal history of other malignant neoplasm of bronchus and lung: Secondary | ICD-10-CM

## 2018-07-23 DIAGNOSIS — E1165 Type 2 diabetes mellitus with hyperglycemia: Secondary | ICD-10-CM

## 2018-07-23 DIAGNOSIS — I10 Essential (primary) hypertension: Secondary | ICD-10-CM

## 2018-07-23 DIAGNOSIS — I5022 Chronic systolic (congestive) heart failure: Secondary | ICD-10-CM

## 2018-07-23 NOTE — Progress Notes (Signed)
Telemedicine Encounter- SOAP NOTE Established Patient  I discussed the limitations, risks, security and privacy concerns of performing an evaluation and management service by telephone and the availability of in person appointments. I also discussed with the patient that there may be a patient responsible charge related to this service. The patient expressed understanding and agreed to proceed.  This telephone encounter was conducted with the patient's verbal consent via audio telecommunications:yes Patient was instructed to have this encounter in a suitably private space; and to only have persons present to whom they give permission to participate. In addition, patient identity was confirmed by use of name plus two identifiers (DOB and address).  I spent a total of 82mn talking with the patient    Chief Complaint: Positive COVID- 19  Pt state that she is positive  With covid 19 on June 8th. Pt states that on June 9th she went back to hospital. Pt states that she is not feeling any better : weak and headache -admission 07/09/18-07/15/18 Infiltrated bilat-given steroids and Remdesivir-completed while hospitalized-improved infliltrates on cxr 6/16  KMargarete Horaceis a 48y.o. female established patient. Telephone visit today for +COVID-hospitalized-no oxygen needed at home-discharge-6/16-meds at d/c albuterol prn Flonase, robitussin, hycodan, claritinD   DM-no endo -needs referral A1c 13.7-Janumet only-pt has not taken insulin and is not checking glucose at home. Pt was given insulin in the hospital to treat elevated glucose readings. 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 DM2 (diabetes mellitus, type 2) (HCC)[place]/18/2020  . Acute respiratory disease due to COVID-19 virus 07/09/2018  . Chronic systolic HF (heart failure) (HCC)[place]/10/2018  . History of malignant neoplasm of upper lobe bronchus or lung 03/02/2017  . Status post lobectomy of lung 03/02/2017  . Hyperlipidemia LDL goal <100 01/05/2017  . Essential hypertension 01/05/2017  . Type 2 diabetes mellitus with hyperglycemia, without long-term current use of insulin (HCC)[place]/08/2016  . Postoperative visit 10/25/2015  . Malignant neoplasm of upper lobe of right lung (HCC)[place]/23/2017    Past Medical History:  Diagnosis Date  . Anxiety   . Diabetes mellitus without complication (HCC)[place]. Hypertension   . Lung cancer, upper lobe (HCC)[place]/2017   [facility]-small [facility] in right upper [facility]  Medication Sig Dispense Refill  . acetaminophen (TYLENOL) 500 MG tablet Take 1,000 mg by mouth every 6 (six) hours as needed for mild pain or moderate pain.    . alMarland Kitchenuterol (VENTOLIN HFA) 108 (90 Base) MCG/ACT inhaler Inhale 2 puffs into the lungs every 4 (four) hours as needed for wheezing or shortness of breath.    . ALPRAZolam (XANAX) 0.5 MG tablet Take 1 tablet (0.5 mg total) by mouth at bedtime as needed for anxiety. 30 tablet 0  . carvedilol (COREG) 25 MG tablet Take 1.5 tablets (37.5 mg total) by mouth 2 (two) times daily. 270 tablet 3  . furosemide (LASIX) 40 MG tablet Take 1 tablet (40 mg total) by mouth daily. 90 tablet 3  . potassium chloride SA (K-DUR,KLOR-CON)  20 MEQ tablet Take 1 tablet (20 mEq total) by mouth daily. 90 tablet 3  . sacubitril-valsartan (ENTRESTO) 49-51 MG Take 1 tablet by mouth 2 (two) times daily. 60 tablet 11  . sitaGLIPtin-metformin (JANUMET) 50-1000 MG tablet Take 1 tablet 2 (two)  times daily with a meal by mouth. 60 tablet 3  . fluticasone (FLONASE) 50 MCG/ACT nasal spray Place 2 sprays into both nostrils daily. (Patient not taking: Reported on 07/23/2018) 16 g 12  . guaiFENesin-dextromethorphan (ROBITUSSIN DM) 100-10 MG/5ML syrup Take 15 mLs by mouth every 4 (four) hours as needed for cough. (Patient not taking: Reported on 07/23/2018) 118 mL 0  . HYDROcodone-acetaminophen (NORCO/VICODIN) 5-325 MG tablet Take 1 tablet by mouth every 4 (four) hours as needed (for headache or severe pain.). (Patient not taking: Reported on 07/23/2018) 12 tablet 0  . HYDROcodone-homatropine (HYCODAN) 5-1.5 MG/5ML syrup Take 5 mLs by mouth every 4 (four) hours as needed for cough. (Patient not taking: Reported on 07/23/2018) 120 mL 0  . loratadine-pseudoephedrine (CLARITIN-D 12 HOUR) 5-120 MG tablet Take 1 tablet by mouth 2 (two) times daily. (Patient not taking: Reported on 07/23/2018) 20 tablet 1  . triamcinolone cream (KENALOG) 0.1 % Apply 1 application topically 2 (two) times daily. (Patient not taking: Reported on 07/23/2018) 30 g 0   No current facility-administered medications for this visit.   HAS ASSISTANCE WITH ADL-adult children living with pt  No Known Allergies  Social History   Socioeconomic History  . Marital status: Divorced    Spouse name: Not on file  . Number of children: Not on file  . Years of education: Not on file  . Highest education level: Not on file  Occupational History  . Not on file  Social Needs  . Financial resource strain: Not on file  . Food insecurity    Worry: Not on file    Inability: Not on file  . Transportation needs    Medical: Not on file    Non-medical: Not on file  Tobacco Use  . Smoking status: Never Smoker  . Smokeless tobacco: Never Used  Substance and Sexual Activity  . Alcohol use: No  . Drug use: No  . Sexual activity: Yes    Birth control/protection: None  Lifestyle  . Physical activity    Days per week: Not on file     Minutes per session: Not on file  . Stress: Not on file  Relationships  . Social Herbalist on phone: Not on file    Gets together: Not on file    Attends religious service: Not on file    Active member of club or organization: Not on file    Attends meetings of clubs or organizations: Not on file    Relationship status: Not on file  . Intimate partner violence    Fear of current or ex partner: Not on file    Emotionally abused: Not on file    Physically abused: Not on file    Forced sexual activity: Not on file  Other Topics Concern  . Not on file  Social History Narrative  . Not on file    Review of Systems  Constitutional: Positive for malaise/fatigue and weight loss. Negative for chills and fever.  HENT: Negative for congestion, sinus pain and sore throat.   Respiratory: Positive for cough, sputum production and shortness of breath. Negative for wheezing.   Cardiovascular: Negative for chest pain and palpitations.  Gastrointestinal: Negative for diarrhea.  Neurological:  Positive for headaches.    Objective   Vitals as reported by the patient: Today's Vitals   07/23/18 0820  Temp: 98.3 F (36.8 C)  Weight: 137 lb (62.1 kg)  Height: '5\' 5"'  (1.651 m)   1. COVID-19 virus infection Pulmonary referral-h/o of lung CA with resection, recent infection with COVID. No h/o tob use-pt agreed to referral for additional eval and treat. Loss of taste, continued headache, weakness and cough - DG Chest 2 View; Future-infiltrates improved at discharge but have not resolved - CMP14+EGFR; Future - CBC with Differential/Platelet; Future  2. Type 2 diabetes mellitus with hyperglycemia, without long-term current use of insulin (HCC) 13.7 A1c-oral meds only-pt agreed to referral to endo for additional eval and treat - CMP14+EGFR; Future - CBC with Differential/Platelet; Future  3. Status post lobectomy of lung No recent oncology f/u- viral pneumonia   4. Chronic systolic HF  (heart failure) ([place]) Cardio referral  5. History of malignant neoplasm of upper lobe bronchus or lung Oncology referral  6. Essential hypertension Cardio f/u longterm HTN-taking coreg  I discussed the assessment and treatment plan with the patient. The patient was provided an opportunity to ask questions and all were answered. The patient agreed with the plan and demonstrated an understanding of the instructions.   The patient was advised to call back or seek an in-person evaluation if the symptoms worsen or if the condition fails to improve as anticipated.  I provided 30 minutes of non-face-to-face time during this encounter.  Mallorey Odonell Hannah Beat, MD  Primary Care at [facility] 07-23-18

## 2018-07-23 NOTE — Progress Notes (Signed)
Chief Complaint: Positive COVID- 19  Pt state that she is positive  With covid 19 on June 8th. Pt states that on June 9th she went back to hospital. Pt states that she is not feeling any better : weak and headache

## 2018-07-23 NOTE — Telephone Encounter (Signed)
[place] disability ppr work that needs to be filled out by provider placed in [employer] at [place]

## 2018-07-24 ENCOUNTER — Ambulatory Visit (HOSPITAL_COMMUNITY)
Admission: RE | Admit: 2018-07-24 | Discharge: 2018-07-24 | Disposition: A | Discharge status: Home / Self Care | Payer: 59 | Source: Ambulatory Visit | Attending: Family Medicine | Admitting: Family Medicine

## 2018-07-24 ENCOUNTER — Other Ambulatory Visit: Payer: Self-pay

## 2018-07-24 ENCOUNTER — Other Ambulatory Visit: Payer: Self-pay | Admitting: Family Medicine

## 2018-07-24 ENCOUNTER — Other Ambulatory Visit (HOSPITAL_COMMUNITY)
Admission: RE | Admit: 2018-07-24 | Discharge: 2018-07-24 | Disposition: A | Discharge status: Home / Self Care | Payer: 59 | Source: Ambulatory Visit | Attending: Family Medicine | Admitting: Family Medicine

## 2018-07-24 DIAGNOSIS — J1289 Other viral pneumonia: Secondary | ICD-10-CM | POA: Diagnosis not present

## 2018-07-24 DIAGNOSIS — U071 COVID-19: Secondary | ICD-10-CM | POA: Diagnosis not present

## 2018-07-24 DIAGNOSIS — J189 Pneumonia, unspecified organism: Secondary | ICD-10-CM

## 2018-07-24 DIAGNOSIS — E1165 Type 2 diabetes mellitus with hyperglycemia: Secondary | ICD-10-CM

## 2018-07-24 LAB — CBC WITH DIFFERENTIAL/PLATELET
Abs Immature Granulocytes: 0.02 10*3/uL (ref 0.00–0.07)
Basophils Absolute: 0.1 10*3/uL (ref 0.0–0.1)
Basophils Relative: 1 %
Eosinophils Absolute: 0.1 10*3/uL (ref 0.0–0.5)
Eosinophils Relative: 2 %
HCT: 36.6 % (ref 36.0–46.0)
Hemoglobin: 11.6 g/dL — ABNORMAL LOW (ref 12.0–15.0)
Immature Granulocytes: 0 %
Lymphocytes Relative: 43 %
Lymphs Abs: 2.8 10*3/uL (ref 0.7–4.0)
MCH: 27.3 pg (ref 26.0–34.0)
MCHC: 31.7 g/dL (ref 30.0–36.0)
MCV: 86.1 fL (ref 80.0–100.0)
Monocytes Absolute: 0.4 10*3/uL (ref 0.1–1.0)
Monocytes Relative: 7 %
Neutro Abs: 3.1 10*3/uL (ref 1.7–7.7)
Neutrophils Relative %: 47 %
Platelets: 360 10*3/uL (ref 150–400)
RBC: 4.25 MIL/uL (ref 3.87–5.11)
RDW: 12.9 % (ref 11.5–15.5)
WBC: 6.4 10*3/uL (ref 4.0–10.5)
nRBC: 0 % (ref 0.0–0.2)

## 2018-07-24 MED ORDER — AZITHROMYCIN 250 MG PO TABS: ORAL_TABLET | ORAL | 0 refills | Status: DC

## 2018-07-25 ENCOUNTER — Telehealth: Payer: Self-pay | Admitting: Family Medicine

## 2018-07-25 ENCOUNTER — Encounter: Payer: Self-pay | Admitting: *Deleted

## 2018-07-25 ENCOUNTER — Other Ambulatory Visit: Payer: Self-pay | Admitting: *Deleted

## 2018-07-25 MED FILL — AZITHROMYCIN 250 MG TABLET: 250 | 5 days supply | Qty: 6 | Fill #0

## 2018-07-25 NOTE — Telephone Encounter (Signed)
[employer] FMLA ppr work came across fax, placed ppr work in [employer] at [place]

## 2018-07-25 NOTE — Patient Outreach (Signed)
[facility]) Care Management  07/25/2018  Anay Walter Jan 14, 1971 [id number]  Transition of care call/case closure   Referral received: 07/10/18 Initial outreach: 07/17/18 Insurance: [place] Choice Plan   Subjective: Third attempt at reaching [facility], a [facility] Patient Engagement Analyst, at her mobile number, successful ; 2 HIPAA identifiers verified. Explained purpose of call and completed transition of care assessment.  Mrs. Mosby states she is still weak and fatigues easily, says she doesn't think the ventolin rescue inhaler helps her dyspnea.  She says she is aware of the chronic disease management programs [place] offers but has not been incentivized to engage in them until this Covid 19  illness. She is receptive to receiving information about the programs via her personal e-mail address.  She says she does weigh and record the readings every morning. She says she does not check her blood sugars and does not have a glucometer. She says she has a home blood pressure cuff monitor.  Her 46 year old daughter Lorenza Chick is assisting with her recovery.  She denies educational needs related to staying safe during the [place] 19 pandemic and she is participating in the home monitoring program for Covid 19 via the MyChart mobile app.    Objective: [name], [facility] distress related to + COVID 19 infection.Comorbidities include: Chronic systolic heart failure, HTN, lung cancer s/p lobectomy, hyperlipidemia, Type 2 DM, and anxiety. Her most recent Hgb A1C= 13.7% on 07/09/18. She was discharged to home on6/16/20 without the need for home health services or durable medical equipment per the discharge summary.  She completed a telehealth visit with her primary care provider on 07/23/18 and was instructed to have another chest x ray in one week as the chest x ray done 07/24/18  showed pneumonia so she was started on Zithromax.  She was also referred to an endocrinologist, a cardiologist and a pulmonologist.    Assessment:  Patient voices good understanding of all discharge instructions.  See transition of care flowsheet for assessment details. Taci is motivated to improve her health since recovering from a serious Covid 19 infection.    Plan:  Reviewed hospital discharge diagnosis of  Positive Covid 19 infection and heart failure and treatment plan using hospital discharge instructions, assessing medication adherence, reviewing problems requiring provider notification, and discussing the importance of follow up with  primary care provider and specialists as directed. Reviewed [place]'s Active Health Management 2020 Wellness Requirements of: Completing the computerized Health Assessment and the Health Action Step with Active Health Management [facility]) by September 30 2018 AND have an annual physical between January 29, 2017 and September 30, 2018 in order to qualify for the 2021 Healthy Lifestyle Premium. E-mailed this information to her personal e-mail address.  Reviewed 's chronic disease management program benefit with Active Heath Management and the [facility] program and e-mailed the information to her personal e-mail address. Referred her to the [facility] program for heart failure self management assistance and referred her to [facility] for Type 2 DM. Also e-mailed her the list of diabetes medication that she can receive with a waived copay to take with her when she sees Dr. Dorris Fetch, the endocrinologist.  No ongoing care management needs identified so will close case to [place] Management services.  Barrington Ellison RN,CCM,CDE [place] Management Coordinator Office Phone [phone] Office Fax [phone]

## 2018-07-28 ENCOUNTER — Other Ambulatory Visit: Payer: Self-pay | Admitting: *Deleted

## 2018-07-28 NOTE — Telephone Encounter (Signed)
Pt calling to check status. Pt states that FMLA paper work is due tomorrow 07/29/18

## 2018-07-28 NOTE — Telephone Encounter (Signed)
Please call to inform if FMLA papers are ready

## 2018-07-28 NOTE — Patient Outreach (Signed)
[facility]) Care Management  07/28/2018  Ireta Pullman [birth year] [id number]   Care Coordination Note  Secure e-mail sent to Caro Hight, clinic nurse manager at Primary Care at [facility], in response to a voice message left for this [facility] on 07/23/18 at 11:25 am.  In addition to [employer] for the voice mail, the e-mail also informed Andria Frames that a transition of care assessment was completed on 07/25/18 and that resources were e-mailed to [place] regarding the chronic disease management programs offered by [facility].   Barrington Ellison RN,CCM,CDE [place] Management Coordinator Office Phone [phone] Office Fax [phone]

## 2018-07-29 ENCOUNTER — Telehealth: Payer: Self-pay | Admitting: Family Medicine

## 2018-07-29 NOTE — Telephone Encounter (Signed)
done

## 2018-07-29 NOTE — Telephone Encounter (Signed)
Pt would like to know if there is an order in for her to get another chest x-ray. Please advise at [phone]

## 2018-07-29 NOTE — Telephone Encounter (Signed)
Please advise 

## 2018-07-30 NOTE — Telephone Encounter (Signed)
Pt stated that her job didn't receive her FMLA papers.

## 2018-07-31 ENCOUNTER — Ambulatory Visit (HOSPITAL_COMMUNITY)
Admission: RE | Admit: 2018-07-31 | Discharge: 2018-07-31 | Disposition: A | Discharge status: Home / Self Care | Payer: 59 | Source: Ambulatory Visit | Attending: Family Medicine | Admitting: Family Medicine

## 2018-07-31 ENCOUNTER — Other Ambulatory Visit: Payer: Self-pay | Admitting: Family Medicine

## 2018-07-31 DIAGNOSIS — U071 COVID-19: Secondary | ICD-10-CM

## 2018-07-31 DIAGNOSIS — C3411 Malignant neoplasm of upper lobe, right bronchus or lung: Secondary | ICD-10-CM

## 2018-07-31 DIAGNOSIS — J189 Pneumonia, unspecified organism: Secondary | ICD-10-CM | POA: Diagnosis not present

## 2018-07-31 DIAGNOSIS — I5022 Chronic systolic (congestive) heart failure: Secondary | ICD-10-CM

## 2018-07-31 MED ORDER — LEVOFLOXACIN 750 MG PO TABS: 750.0000 mg | ORAL_TABLET | Freq: Every day | ORAL | 0 refills | Status: DC

## 2018-07-31 MED FILL — levoFLOXacin 750 MG TABS: 750 | 5 days supply | Qty: 5 | Fill #0

## 2018-07-31 NOTE — Progress Notes (Signed)
l °

## 2018-08-01 ENCOUNTER — Telehealth: Payer: Self-pay | Admitting: Family Medicine

## 2018-08-01 NOTE — Telephone Encounter (Signed)
Copied from [place] [phone]. Topic: General - Other >> Jul 30, 2018  3:18 PM Alanda Slim E wrote: Reason for CRM: Pt is suppose to have an xray done tomorrow at [facility] and the orders have not been put in for it. Pt asked if they can be sent of what she should do. Pt also inquired about her FMLA paperwork that is 2 days past due. / please call Pt and advise asap

## 2018-08-04 ENCOUNTER — Encounter (HOSPITAL_COMMUNITY): Payer: Self-pay | Admitting: Surgery

## 2018-08-04 ENCOUNTER — Telehealth: Payer: Self-pay | Admitting: Family Medicine

## 2018-08-04 NOTE — Telephone Encounter (Signed)
Referral Request - Has patient seen PCP for this complaint? Yes.   *If NO, is insurance requiring patient see PCP for this issue before PCP can refer them? Referral for which specialty:order a echocardiogram and put it in Epic  Preferred provider/office: Dr. Holly Bodily Reason for referral: She and provider talked about ordering one for her but its not in the system. Also patient has not heared back about her FMLA paperwork that she gave office and its way pass due. Patient would like to talk to someone about this.

## 2018-08-05 ENCOUNTER — Inpatient Hospital Stay (HOSPITAL_COMMUNITY): Payer: 59 | Attending: Hematology | Admitting: Hematology

## 2018-08-05 ENCOUNTER — Other Ambulatory Visit: Payer: Self-pay

## 2018-08-05 ENCOUNTER — Telehealth: Payer: Self-pay | Admitting: Family Medicine

## 2018-08-05 NOTE — Telephone Encounter (Signed)
Check phone message from 6/26. I  Think this paperwork is in nurse's station in [place]'s box.

## 2018-08-05 NOTE — Telephone Encounter (Signed)
Copied from [place] [phone]. Topic: General - Other >> Aug 04, 2018  4:14 PM Alanda Slim E wrote: Reason for CRM: Pt called to see if her FMLA paperwork can be emailed to her [email or web address]/ please advise

## 2018-08-05 NOTE — Telephone Encounter (Signed)
Yes order was placed for xray

## 2018-08-06 ENCOUNTER — Telehealth (HOSPITAL_COMMUNITY): Payer: Self-pay | Admitting: Radiology

## 2018-08-06 DIAGNOSIS — I5043 Acute on chronic combined systolic (congestive) and diastolic (congestive) heart failure: Secondary | ICD-10-CM

## 2018-08-06 DIAGNOSIS — I447 Left bundle-branch block, unspecified: Secondary | ICD-10-CM

## 2018-08-06 DIAGNOSIS — I5022 Chronic systolic (congestive) heart failure: Secondary | ICD-10-CM

## 2018-08-06 NOTE — Telephone Encounter (Signed)
Left message to call office-Patient needs to schedule an echocardiogram.  

## 2018-08-06 NOTE — Telephone Encounter (Signed)
Echo ordered per Dr. Acie Fredrickson and Pt request for Dr. Holly Bodily.

## 2018-08-07 ENCOUNTER — Telehealth: Payer: Self-pay | Admitting: Family Medicine

## 2018-08-07 ENCOUNTER — Other Ambulatory Visit: Payer: 59

## 2018-08-07 DIAGNOSIS — C3411 Malignant neoplasm of upper lobe, right bronchus or lung: Secondary | ICD-10-CM

## 2018-08-07 NOTE — Telephone Encounter (Signed)
Copied from [place] [phone]. Topic: General - Other >> Jul 30, 2018  3:18 PM Crystal Bryant wrote: Reason for CRM: Pt is suppose to have an xray done tomorrow at [facility] and the orders have not been put in for it. Pt asked if they can be sent of what she should do. Pt also inquired about her FMLA paperwork that is 2 days past due. / please call Pt and advise asap >> Aug 01, 2018  4:55 PM Crystal Bryant wrote: Also look into 6/26 phone message >> Aug 01, 2018  8:12 AM Long, Crystal Bryant wrote: I have faxed FMLA paperwork to [facility] [employer] on July 31, 2018 per Dr. Holly Bodily. Put in the scan box.

## 2018-08-07 NOTE — Telephone Encounter (Signed)
Copied from [place] [phone]. Topic: General - Other >> Aug 06, 2018  2:28 PM Burchel, Abbi R wrote: Reason for CRM: Pt would like a call back to discuss whether or not she needs a repeat chest xray to determine if her pneumonia has cleared up.  Please call pt to advise: [phone]

## 2018-08-07 NOTE — Telephone Encounter (Signed)
Patient calling to check the status of whether she is needing another chest x-ray or a covid test to determine if the pneumonia has gone away. 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  Also states that the [employer] is needing additional information from Dr Holly Bodily and has faxed over paperwork. Please advise.  Fax# for the [place]: [phone] CB#: [phone]

## 2018-08-08 ENCOUNTER — Telehealth: Payer: Self-pay | Admitting: Family Medicine

## 2018-08-08 NOTE — Telephone Encounter (Signed)
Copied from [place] [phone]. Topic: General - Other >> Aug 06, 2018  2:28 PM Burchel, Abbi R wrote: Reason for CRM: Pt would like a call back to discuss whether or not she needs a repeat chest xray to determine if her pneumonia has cleared up.  Please call pt to advise: [phone] >> Aug 08, 2018  1:35 PM Mcneil, Ja-Kwan wrote: Pt called back for an update on her question about getting an x-ray. Pt also requests an update on whether her disability paperwork was received and completed.

## 2018-08-11 ENCOUNTER — Ambulatory Visit (HOSPITAL_COMMUNITY): Payer: 59 | Admitting: Hematology

## 2018-08-11 ENCOUNTER — Ambulatory Visit (HOSPITAL_COMMUNITY)
Admission: RE | Admit: 2018-08-11 | Discharge: 2018-08-11 | Disposition: A | Discharge status: Home / Self Care | Payer: 59 | Source: Ambulatory Visit | Attending: Cardiovascular Disease | Admitting: Cardiovascular Disease

## 2018-08-11 ENCOUNTER — Other Ambulatory Visit: Payer: Self-pay

## 2018-08-11 DIAGNOSIS — I5043 Acute on chronic combined systolic (congestive) and diastolic (congestive) heart failure: Secondary | ICD-10-CM | POA: Diagnosis not present

## 2018-08-11 DIAGNOSIS — I5022 Chronic systolic (congestive) heart failure: Secondary | ICD-10-CM

## 2018-08-11 DIAGNOSIS — I447 Left bundle-branch block, unspecified: Secondary | ICD-10-CM

## 2018-08-11 LAB — NOVEL CORONAVIRUS, NAA: SARS-CoV-2, NAA: NOT DETECTED

## 2018-08-11 NOTE — Progress Notes (Signed)
*  PRELIMINARY RESULTS* Echocardiogram 2D Echocardiogram has been performed.  Samuel Germany 08/11/2018, 2:55 PM

## 2018-08-12 ENCOUNTER — Telehealth: Payer: Self-pay | Admitting: Nurse Practitioner

## 2018-08-12 ENCOUNTER — Other Ambulatory Visit: Payer: Self-pay

## 2018-08-12 ENCOUNTER — Encounter: Payer: Self-pay | Admitting: Cardiovascular Disease

## 2018-08-12 ENCOUNTER — Ambulatory Visit (INDEPENDENT_AMBULATORY_CARE_PROVIDER_SITE_OTHER): Payer: 59 | Admitting: Cardiovascular Disease

## 2018-08-12 ENCOUNTER — Telehealth: Payer: Self-pay | Admitting: Family Medicine

## 2018-08-12 VITALS — BP 130/82 | HR 101 | Ht 65.0 in | Wt 144.8 lb

## 2018-08-12 DIAGNOSIS — I10 Essential (primary) hypertension: Secondary | ICD-10-CM

## 2018-08-12 DIAGNOSIS — I447 Left bundle-branch block, unspecified: Secondary | ICD-10-CM

## 2018-08-12 MED ORDER — ENTRESTO 97-103 MG PO TABS: 1.0000 | ORAL_TABLET | Freq: Two times a day (BID) | ORAL | 11 refills | Status: DC

## 2018-08-12 MED ORDER — TORSEMIDE 20 MG PO TABS: 20.0000 mg | ORAL_TABLET | Freq: Every day | ORAL | 11 refills | Status: DC

## 2018-08-12 MED FILL — ENTRESTO 97 MG-103 MG TAB: 97-103 | 30 days supply | Qty: 60 | Fill #0

## 2018-08-12 MED FILL — TORSEMIDE 20 MG TABLET: 20 | 30 days supply | Qty: 30 | Fill #0

## 2018-08-12 NOTE — Telephone Encounter (Signed)
Spoke with CM and she informed me that FMLA is being worked on at this time and we will call the pt when it is complete.

## 2018-08-12 NOTE — Patient Instructions (Signed)
Medication Instructions:  Your physician has recommended you make the following change in your medication:  INCREASE Entresto to 97-103 mg twice daily STOP Furosemide (Lasix) START Torsemide (Demadex) 20 mg once daily   If you need a refill on your cardiac medications before your next appointment, please call your pharmacy.    Lab work: None Ordered   Testing/Procedures: None Ordered    Follow-Up: Your physician recommends that you return for a follow-up appointment on Tuesday July 21 at 7:40 am with Dr. Acie Fredrickson

## 2018-08-12 NOTE — Telephone Encounter (Signed)
Patient called to check status to see if patient needs another chest xray. Call back [phone]

## 2018-08-12 NOTE — Telephone Encounter (Signed)
Short term disabilty ppr work came across ,put in provider box Fr

## 2018-08-12 NOTE — Telephone Encounter (Signed)
   Patient had positive Covid test on June 8. Her follow-up test on July 9 was negative. She reports no new symptoms of Covid-19. Her SOB is likely due to her reduced EF.  COVID-19 Pre-Screening Questions:  . In the past 7 to 10 days have you had a cough,  shortness of breath, headache, congestion, fever (100 or greater) body aches, chills, sore throat, or sudden loss of taste or sense of smell? . Have you been around anyone with known Covid 19. . Have you been around anyone who is awaiting Covid 19 test results in the past 7 to 10 days? . Have you been around anyone who has been exposed to Covid 19, or has mentioned symptoms of Covid 19 within the past 7 to 10 days?  If you have any concerns/questions about symptoms patients report during screening (either on the phone or at [place]). Contact the provider seeing the patient or DOD for further guidance.  If neither are available contact a member of the leadership team.

## 2018-08-12 NOTE — Progress Notes (Signed)
Cardiology Office Note:    Date:  08/12/2018   ID:  Crystal Bryant, DOB [birth year], MRN [id number]  PCP:  Crystal Knapp, MD  Cardiologist:  Crystal Moores, MD   Referring MD: Crystal Knapp, MD   Chief Complaint  Patient presents with  . Congestive Heart Failure      Initial Visit   May 03, 2017    Crystal Bryant is a 48 y.o. female with a hx of non small cell lung cancer, HTN, LBBB   Hx of lung cancer last year in 2018 , status post resection.  She did not require chemotherapy. She has a history of high blood pressure.    Had HELLP syndrome with birth of son  .  She required dialysis for a short time following the birth of her son.  For the past 24 years since the birth of her son.  Hx of DM     Generally she has felt well,  She has had shortness of breath doing her normal activities.    intermittant shortness of breath.   Episodes may last for 10-15 minutes.  No chest tightness or pain   Exercises - walks the treadmill on occasion but does not push the pace  + PND and orthopnea since her lung surgery   Tries to limit her salt intake .  Not much fast food or take out .   Had an echo at [facility] ~ 2017 ( results not found )   Non smoker  No ETOH Family HTN - on both sides of family   August 12, 2018 :  Crystal Bryant is seen today for CHF follow up Had COvid 19 in July 07, 2018  was at [facility] for a week pain on your ejection fraction [place] Having lots of dyspnea , especially at night .    Past Medical History:  Diagnosis Date  . Anxiety   . CHF (congestive heart failure) ([place])   . Diabetes mellitus without complication ([place])   . Hypertension   . Lung cancer, upper lobe ([place]) 09/2015   Non-small cell in right upper lobe treated with RULectomy by VATS at [facility]    Past Surgical History:  Procedure Laterality Date  . ABDOMINAL HYSTERECTOMY    . BREAST BIOPSY Right 02/10/2018   Biopsy of upper outer quadrant of right breast due to calcifications seen on  mammography revealed fat necrosis with calcifications.  Benign so continue annual screening mammograms.  Marland Kitchen BREAST SURGERY    . CESAREAN SECTION    . LOBECTOMY Right 10/06/2015   [place] lung cancer treated with VATS right upper lobectomy by Dr. Maryjane Bryant at [facility].    Current Medications: Current Meds  Medication Sig  . acetaminophen (TYLENOL) 500 MG tablet Take 1,000 mg by mouth every 6 (six) hours as needed for mild pain or moderate pain.  Marland Kitchen albuterol (VENTOLIN HFA) 108 (90 Base) MCG/ACT inhaler Inhale 2 puffs into the lungs every 4 (four) hours as needed for wheezing or shortness of breath.  . ALPRAZolam (XANAX) 0.5 MG tablet Take 1 tablet (0.5 mg total) by mouth at bedtime as needed for anxiety.  . carvedilol (COREG) 25 MG tablet Take 1.5 tablets (37.5 mg total) by mouth 2 (two) times daily.  . potassium chloride SA (K-DUR,KLOR-CON) 20 MEQ tablet Take 1 tablet (20 mEq total) by mouth daily.  . sitaGLIPtin-metformin (JANUMET) 50-1000 MG tablet Take 1 tablet 2 (two) times daily with a meal by mouth.  . [DISCONTINUED] furosemide (LASIX) 40  MG tablet Take 1 tablet (40 mg total) by mouth daily.  . [DISCONTINUED] sacubitril-valsartan (ENTRESTO) 49-51 MG Take 1 tablet by mouth 2 (two) times daily.     Allergies:   Patient has no known allergies.   Social History   Socioeconomic History  . Marital status: Divorced    Spouse name: Not on file  . Number of children: 2  . Years of education: Not on file  . Highest education level: Not on file  Occupational History  . Occupation: Patient Engineer, manufacturing systems: [place]  . Financial resource strain: Not hard at all  . Food insecurity    Worry: Never true    Inability: Never true  . Transportation needs    Medical: No    Non-medical: No  Tobacco Use  . Smoking status: Never Smoker  . Smokeless tobacco: Never Used  Substance and Sexual Activity  . Alcohol use: No  . Drug use: No  . Sexual activity: Yes    Birth  control/protection: Surgical  Lifestyle  . Physical activity    Days per week: 0 days    Minutes per session: 0 min  . Stress: Rather much  Relationships  . Social Herbalist on phone: Three times a week    Gets together: Three times a week    Attends religious service: More than 4 times per year    Active member of club or organization: No    Attends meetings of clubs or organizations: Never    Relationship status: Divorced  Other Topics Concern  . Not on file  Social History Narrative  . Not on file     Family History: The patient's family history includes Breast cancer in her paternal aunt, paternal aunt, paternal aunt, and paternal aunt; Diabetes in her mother; Hypertension in her mother; Stroke in her father.  ROS:   Please see the history of present illness.     All other systems reviewed and are negative.  EKGs/Labs/Other Studies Reviewed:    The following studies were reviewed today:   EKG:   May 03, 2017: Sinus tachycardia at 110 beats a minute.  Left bundle branch block.  Recent Labs: 07/09/2018: B Natriuretic Peptide 118.0 07/10/2018: Magnesium 2.4 07/15/2018: ALT 22; BUN 21; Creatinine, Ser 0.65; Potassium 4.4; Sodium 136 07/24/2018: Hemoglobin 11.6; Platelets 360  Recent Lipid Panel    Component Value Date/Time   CHOL 278 (H) 12/15/2016 1201   TRIG 171 (H) 07/09/2018 1015   HDL 45 12/15/2016 1201   CHOLHDL 6.2 (H) 12/15/2016 1201   LDLCALC 197 (H) 12/15/2016 1201    Physical Exam:     Physical Exam: Blood pressure 130/82, pulse (!) 101, height 5\' 5"  (1.651 m), weight 144 lb 12.8 oz (65.7 kg), SpO2 99 %.  GEN:  Well nourished, well developed in no acute distress HEENT: Normal NECK: No JVD; No carotid bruits LYMPHATICS: No lymphadenopathy CARDIAC: RR,  Tachy,  ? S3 gallop,  Soft systolic murmur  RESPIRATORY:  Clear to auscultation without rales, wheezing or rhonchi  ABDOMEN: Soft, non-tender, non-distended MUSCULOSKELETAL:  No edema; No  deformity  SKIN: Warm and dry NEUROLOGIC:  Alert and oriented x 3     ASSESSMENT:    No diagnosis found. PLAN:    In order of problems listed above:  1. Acute on chronic systolic diastolic congestive heart failure: Continue presents today with with worsening heart failure symptoms.  She has obvious orthopnea in the  exam room today.  Her echocardiogram shows an EF of 30%.  She is tachycardic.  She has been taking all of her medications but she clearly is volume overloaded.  She is not getting a very good diuresis with Lasix.  We will discontinue Lasix and start her on torsemide 20 mg once a day.  I have asked her to call the office on Friday to let us know whether she is achieving diuresis or not.  We will increase the torsemide to twice a day if she is not adequately diuresing. We will also increase her Entresto to 97-103 mg twice a day.  I will see him back in 1 week.  We will consider adding Aldactone at that time. She has a left bundle branch block with persistently depressed left ventricular systolic function.  I suspect that she will need a biventricular pacer/ICD.  We will discuss the case with Dr. Lovena Le.   2.  Left bundle branch block:  :  Has LBBB .     3.  Essential hypertension:   BP is better   3.  Diabetes mellitus:    4.  Non-small cell lung cancer: She is status post surgical resection.  We will follow-up with the doctors at [facility].   Medication Adjustments/Labs and Tests Ordered: Current medicines are reviewed at length with the patient today.  Concerns regarding medicines are outlined above.  No orders of the defined types were placed in this encounter.  Meds ordered this encounter  Medications  . torsemide (DEMADEX) 20 MG tablet    Sig: Take 1 tablet (20 mg total) by mouth daily.    Dispense:  30 tablet    Refill:  11  . sacubitril-valsartan (ENTRESTO) 97-103 MG    Sig: Take 1 tablet by mouth 2 (two) times daily.    Dispense:  60 tablet    Refill:  11      Signed, Crystal Moores, MD  08/12/2018 5:38 PM    [place]

## 2018-08-12 NOTE — Telephone Encounter (Signed)
-----   Message from Thayer Headings, MD sent at 08/11/2018  5:39 PM EDT ----- EF is unchanged from her original echo - perhaps slightly worse. She needs to get in for an office visit. I am happy to see her .   She works at our [employer] and it might be easier for her to see one of our partners there.

## 2018-08-13 ENCOUNTER — Ambulatory Visit: Payer: Self-pay | Admitting: *Deleted

## 2018-08-13 ENCOUNTER — Other Ambulatory Visit: Payer: Self-pay | Admitting: *Deleted

## 2018-08-13 NOTE — Telephone Encounter (Signed)
Pt called with having weakness in her right hand. She woke up this morning with some shortness of breath but she has a hx of CHF and will have some shortness of breath at times. She also is having some numbness in the hand. She denies any other extremity with numbness or weakness. She tested positive for covid-19 on June i8 th and wonders if this is effect of having the virus. She denies any other symptoms. Per protocol she is advised to do a virtual appointment with her provider, unless pcp wants to see her in the office. Pt voiced understanding. Primary Care at [facility] notified for an appointment. Routing to the practice for review.  Reason for Disposition . [1] Weakness of the face, arm / hand, or leg / foot on one side of the body AND [2] gradual onset (e.g., days to weeks) AND [3] present now  Answer Assessment - Initial Assessment Questions 1. SYMPTOM: "What is the main symptom you are concerned about?" (e.g., weakness, numbness)     Weakness and some numbness in the right hand at times 2. ONSET: "When did this start?" (minutes, hours, days; while sleeping)     This morning 3. LAST NORMAL: "When was the last time you were normal (no symptoms)?"     yesterday 4. PATTERN "Does this come and go, or has it been constant since it started?"  "Is it present now?"     constant 5. CARDIAC SYMPTOMS: "Have you had any of the following symptoms: chest pain, difficulty breathing, palpitations?"     Some shortness of breath 6. NEUROLOGIC SYMPTOMS: "Have you had any of the following symptoms: headache, dizziness, vision loss, double vision, changes in speech, unsteady on your feet?"     no 7. OTHER SYMPTOMS: "Do you have any other symptoms?"    Was positive for covid-19 on June 8 th 8. PREGNANCY: "Is there any chance you are pregnant?" "When was your last menstrual period?"     No LMP  Had hysterctomy  Protocols used: NEUROLOGIC DEFICIT-A-AH

## 2018-08-13 NOTE — Patient Outreach (Addendum)
[facility]) Care Management  08/13/2018  Crystal Bryant [birth year] [id number]   Care Coordination   Per In Basket message sent to this [facility] on 7/14 by Lorin Picket, spoke with Dr. Holly Bodily at [facility]; she is requesting this RNCM reach out to [name] to assist with care management needs.  Subject: Successful outreach to [place] via mobile number. She states she had a rough night, c/o orthopnea. She verified that she started the torsemide that was prescribed by Dr. Acie Fredrickson when she saw him in the office yesterday and she is taking the increased dose of Entresto per Dr Elmarie Shiley instructions. She says she weighed 137.6 lbs this morning. Her weight at Dr Elmarie Shiley office yesterday was  144.8 lbs. She correctly identifies problems that would require provider notification regarding weight gain and increased dyspnea, and/or orthopnea. She states she has a follow up appointment with Dr. Acie Fredrickson next week. She says her blood sugars continue to run high, >200 in the morning,  and that she has an initial appointment with Dr. Dorris Fetch (endocrinologist) on 09/03/18. She says she needs more test strips but is unsure how to order them through the [facility] app. She says she does not have an appetite and is not eating well because she has lost her sense of taste secondary to the coronavirus infection. She says she is trying to drink smoothies made with Ensure and fruit or Muscle Milk and fruit. She states she is adherent with her diabetes medication. She says she is not able to exercise due to fatigue and dyspnea. Crystal Bryant says she had a repeat coronavirus nasal squab on 7/13 and the test was negative. Crystal Bryant says she reschedule her oncology appointment for mid September after the office cancelled her 7/13 appointment due to her coronavirus infection. She says she does not see the urgency in seeing an oncologist before mid September. After discussion , she says she will call and move up the  appointment.  Crystal Bryant says her 46 year old daughter is helping her with navigating the [employer], preparing meals and assisting with driving errands.  Interventions: Positive reinforcement given to [facility] for enrolling in the [facility] platform for assistance with diabetes self management and arranging follow up with providers. Discussed the importance of keeping follow up appointments with providers and encouraged Crystal Bryant to contact Dr. Delton Coombes 's office to ask for an earlier appointment at Dr Corum's suggestion.  Discussed reasons for elevated fasting glucose, and assessed dietary intake and ability to exercise. Will mail [employer] on healthy food choices and chair exercises. Also contacted [employer] representative Haylee Dawe to secure coupons and free samples of [facility] for [place]. Included  educational handouts on Covid 19 Nutrition and Hydration, and Nutrition and Immunity and Dehydration and How to Stay Hydrated. Contacted Psychologist, prison and probation services and [place] to assist Crystal Bryant in securing more diabetes test strips. Will contact Dr. Acie Fredrickson to see if he would consider prescribing an SGLT2 inhibitor to assist with improved glucose management and to provide cardiovascular benefits in light of her heart failure. Contacted Janit Bern at [facility] to determine copay for SGLT2 inhibitors for [place]. Encouraged Crystal Bryant to contact this RNCM for further care management concerns and Crystal Fitz RN, CDE, Tree surgeon, for assistance with diabetes self management.   Barrington Ellison RN,CCM,CDE [place] Management Coordinator Office Phone [phone] Office Fax [phone]

## 2018-08-14 ENCOUNTER — Telehealth: Payer: Self-pay | Admitting: Family Medicine

## 2018-08-14 NOTE — Telephone Encounter (Signed)
Disregard previous message about xray.  She has a follow up with Dr. Carlota Raspberry on 7-17.

## 2018-08-14 NOTE — Telephone Encounter (Signed)
Pt called in regards to her FMLA and short term disability paperwork. Pt claims she has called multiple times and no one has called her back. Pt states her job continues to ask her for the paperwork everyday and needs a call today.

## 2018-08-15 ENCOUNTER — Ambulatory Visit: Payer: 59 | Admitting: Family Medicine

## 2018-08-15 ENCOUNTER — Telehealth: Payer: Self-pay

## 2018-08-15 ENCOUNTER — Telehealth: Payer: Self-pay | Admitting: Family Medicine

## 2018-08-15 NOTE — Telephone Encounter (Signed)
Has appointment scheduled for 08/19/2018.

## 2018-08-15 NOTE — Telephone Encounter (Signed)
Pt called in this morning very distraught because she states she was told her appt would be virtual we had no showed her and unable to reschedule for today.pt is needing follow up on Covid- 19 to return to work and looking for [employer] work . I scheduled 1st available virtual vist for 07.28.2020  With Dr.Corum FR

## 2018-08-15 NOTE — Telephone Encounter (Signed)
Information requested from  [facility] has been faxed to [phone] including pt ed notes, pcp notes, cardio,  xrays, ekg's and lab results

## 2018-08-18 ENCOUNTER — Telehealth: Payer: Self-pay | Admitting: Cardiovascular Disease

## 2018-08-18 NOTE — Telephone Encounter (Signed)

## 2018-08-18 NOTE — Progress Notes (Signed)
Cardiology Office Note:    Date:  08/19/2018   ID:  Crystal Bryant, DOB [birth year], MRN [id number]  PCP:  Shawnee Knapp, MD  Cardiologist:  Mertie Moores, MD   Referring MD: Shawnee Knapp, MD   Chief Complaint  Patient presents with  . Congestive Heart Failure      Initial Visit   May 03, 2017    Crystal Bryant is a 48 y.o. female with a hx of non small cell lung cancer, HTN, LBBB   Hx of lung cancer last year in 2018 , status post resection.  She did not require chemotherapy. She has a history of high blood pressure.    Had HELLP syndrome with birth of son  .  She required dialysis for a short time following the birth of her son.  For the past 24 years since the birth of her son.  Hx of DM     Generally she has felt well,  She has had shortness of breath doing her normal activities.    intermittant shortness of breath.   Episodes may last for 10-15 minutes.  No chest tightness or pain   Exercises - walks the treadmill on occasion but does not push the pace  + PND and orthopnea since her lung surgery   Tries to limit her salt intake .  Not much fast food or take out .   Had an echo at [facility] ~ 2017 ( results not found )   Non smoker  No ETOH Family HTN - on both sides of family   August 12, 2018 :  libi is seen today for CHF follow up Had COvid 19 in July 07, 2018  was at [facility] for a week pain on your ejection fraction [place] Having lots of dyspnea , especially at night .   August 19, 2018  [name] is seen for follow up of her CHF I saw her last week.  We increased the Entresto to 97-103 mg twice a day.  We started her on torsemide instead of Lasix. Wt. Today is 139.8 lbs,   Down 5 lbs from last week No orthopnea or pnd now. DOE is better .   [name], CHN nurse contacted me and asked [place] to start her   on Jardiance.  Past Medical History:  Diagnosis Date  . Anxiety   . CHF (congestive heart failure) ([place])   . Diabetes mellitus without  complication ([place])   . Hypertension   . Lung cancer, upper lobe ([place]) 09/2015   Non-small cell in right upper lobe treated with RULectomy by VATS at [facility]    Past Surgical History:  Procedure Laterality Date  . ABDOMINAL HYSTERECTOMY    . BREAST BIOPSY Right 02/10/2018   Biopsy of upper outer quadrant of right breast due to calcifications seen on mammography revealed fat necrosis with calcifications.  Benign so continue annual screening mammograms.  Marland Kitchen BREAST SURGERY    . CESAREAN SECTION    . LOBECTOMY Right 10/06/2015   [place] lung cancer treated with VATS right upper lobectomy by Dr. [name] at [facility].    Current Medications: Current Meds  Medication Sig  . acetaminophen (TYLENOL) 500 MG tablet Take 1,000 mg by mouth every 6 (six) hours as needed for mild pain or moderate pain.  Marland Kitchen albuterol (VENTOLIN HFA) 108 (90 Base) MCG/ACT inhaler Inhale 2 puffs into the lungs every 4 (four) hours as needed for wheezing or shortness of breath.  . ALPRAZolam (XANAX) 0.5  MG tablet Take 1 tablet (0.5 mg total) by mouth at bedtime as needed for anxiety.  . carvedilol (COREG) 25 MG tablet Take 1.5 tablets (37.5 mg total) by mouth 2 (two) times daily.  . sacubitril-valsartan (ENTRESTO) 97-103 MG Take 1 tablet by mouth 2 (two) times daily.  . sitaGLIPtin-metformin (JANUMET) 50-1000 MG tablet Take 1 tablet 2 (two) times daily with a meal by mouth.  . torsemide (DEMADEX) 20 MG tablet Take 1 tablet (20 mg total) by mouth daily.  . [DISCONTINUED] potassium chloride SA (K-DUR,KLOR-CON) 20 MEQ tablet Take 1 tablet (20 mEq total) by mouth daily.     Allergies:   Patient has no known allergies.   Social History   Socioeconomic History  . Marital status: Divorced    Spouse name: Not on file  . Number of children: 2  . Years of education: Not on file  . Highest education level: Not on file  Occupational History  . 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 Financial  resource strain: Not hard at all  . Food insecurity    Worry: Never true    Inability: Never true  . Transportation needs    Medical: No    Non-medical: No  Tobacco Use  . Smoking status: Never Smoker  . Smokeless tobacco: Never Used  Substance and Sexual Activity  . Alcohol use: No  . Drug use: No  . Sexual activity: Yes    Birth control/protection: Surgical  Lifestyle  . Physical activity    Days per week: 0 days    Minutes per session: 0 min  . Stress: Rather much  Relationships  . Social Herbalist on phone: Three times a week    Gets together: Three times a week    Attends religious service: More than 4 times per year    Active member of club or organization: No    Attends meetings of clubs or organizations: Never    Relationship status: Divorced  Other Topics Concern  . Not on file  Social History Narrative  . Not on file     Family History: The patient's family history includes Breast cancer in her paternal aunt, paternal aunt, paternal aunt, and paternal aunt; Diabetes in her mother; Hypertension in her mother; Stroke in her father.  ROS:   Please see the history of present illness.     All other systems reviewed and are negative.  EKGs/Labs/Other Studies Reviewed:    The following studies were reviewed today:   EKG:   May 03, 2017: Sinus tachycardia at 110 beats a minute.  Left bundle branch block.  Recent Labs: 07/09/2018: B Natriuretic Peptide 118.0 07/10/2018: Magnesium 2.4 07/15/2018: ALT 22; BUN 21; Creatinine, Ser 0.65; Potassium 4.4; Sodium 136 07/24/2018: Hemoglobin 11.6; Platelets 360  Recent Lipid Panel    Component Value Date/Time   CHOL 278 (H) 12/15/2016 1201   TRIG 171 (H) 07/09/2018 1015   HDL 45 12/15/2016 1201   CHOLHDL 6.2 (H) 12/15/2016 1201   LDLCALC 197 (H) 12/15/2016 1201    Physical Exam:     Physical Exam: Blood pressure 120/88, pulse 95, height 5\' 5"  (1.651 m), weight 139 lb 12.8 oz (63.4 kg), SpO2 99 %.   GEN:  Well nourished, well developed in no acute distress HEENT: Normal NECK: No JVD; No carotid bruits LYMPHATICS: No lymphadenopathy CARDIAC: RR , + S3 gallop  RESPIRATORY:  Clear to auscultation without rales, wheezing or rhonchi  ABDOMEN: Soft, non-tender, non-distended MUSCULOSKELETAL:  No edema; No deformity  SKIN: Warm and dry NEUROLOGIC:  Alert and oriented x 3     ASSESSMENT:    1. LBBB (left bundle branch block)   2. Acute on chronic combined systolic and diastolic CHF (congestive heart failure) (HCC)    PLAN:    In order of problems listed above:  Acute on chronic systolic diastolic congestive heart failure:  Continue the seems to be doing better.  She is diuresed nicely on the torsemide.  Her weight is down 5 pounds from last week.  She is breathing much better at night.  She still has some dyspnea on exertion.  We will start her on Jardiance 10 mg a day.  We will also add spironolactone 12.5 mg a day.  We will reduce her potassium chloride to 10 mEq a day.  We will check a basic metabolic profile today and again in 2 weeks.  I will see her again in approximately 1 month for continued titration upward on her medications.  I discussed the case with Dr. Lovena Le.  He agrees that she has a high likelihood of needing a biventricular ICD.  2.  Left bundle branch block:  :  Continue medical therapy.   Likely would benefit from a BI - V ICD   3.  Essential hypertension:   BP is better   3.  Diabetes mellitus:  Starting Jardiance   4.  Non-small cell lung cancer: She is status post surgical resection.  We will follow-up with the doctors at [facility].   Medication Adjustments/Labs and Tests Ordered: Current medicines are reviewed at length with the patient today.  Concerns regarding medicines are outlined above.  Orders Placed This Encounter  Procedures  . Basic metabolic panel  . Basic Metabolic Panel (BMET)   Meds ordered this encounter  Medications  . empagliflozin  (JARDIANCE) 10 MG TABS tablet    Sig: Take 10 mg by mouth daily.    Dispense:  90 tablet    Refill:  3  . spironolactone (ALDACTONE) 25 MG tablet    Sig: Take 0.5 tablets (12.5 mg total) by mouth daily.    Dispense:  45 tablet    Refill:  3  . potassium chloride (K-DUR) 10 MEQ tablet    Sig: Take 1 tablet (10 mEq total) by mouth daily.    Dispense:  90 tablet    Refill:  3     Signed, Mertie Moores, MD  08/19/2018 5:49 PM    [place]

## 2018-08-19 ENCOUNTER — Ambulatory Visit: Payer: 59 | Admitting: Cardiovascular Disease

## 2018-08-19 ENCOUNTER — Ambulatory Visit (INDEPENDENT_AMBULATORY_CARE_PROVIDER_SITE_OTHER): Payer: 59 | Admitting: Cardiovascular Disease

## 2018-08-19 ENCOUNTER — Other Ambulatory Visit: Payer: Self-pay | Admitting: Nurse Practitioner

## 2018-08-19 ENCOUNTER — Telehealth (INDEPENDENT_AMBULATORY_CARE_PROVIDER_SITE_OTHER): Payer: 59 | Admitting: Family Medicine

## 2018-08-19 ENCOUNTER — Encounter: Payer: Self-pay | Admitting: Cardiovascular Disease

## 2018-08-19 ENCOUNTER — Other Ambulatory Visit: Payer: Self-pay

## 2018-08-19 VITALS — BP 120/88 | HR 95 | Ht 65.0 in | Wt 139.8 lb

## 2018-08-19 DIAGNOSIS — C3411 Malignant neoplasm of upper lobe, right bronchus or lung: Secondary | ICD-10-CM

## 2018-08-19 DIAGNOSIS — Z794 Long term (current) use of insulin: Secondary | ICD-10-CM

## 2018-08-19 DIAGNOSIS — I5043 Acute on chronic combined systolic (congestive) and diastolic (congestive) heart failure: Secondary | ICD-10-CM | POA: Diagnosis not present

## 2018-08-19 DIAGNOSIS — Z85118 Personal history of other malignant neoplasm of bronchus and lung: Secondary | ICD-10-CM

## 2018-08-19 DIAGNOSIS — E1101 Type 2 diabetes mellitus with hyperosmolarity with coma: Secondary | ICD-10-CM | POA: Diagnosis not present

## 2018-08-19 DIAGNOSIS — I10 Essential (primary) hypertension: Secondary | ICD-10-CM | POA: Diagnosis not present

## 2018-08-19 DIAGNOSIS — I447 Left bundle-branch block, unspecified: Secondary | ICD-10-CM

## 2018-08-19 DIAGNOSIS — I5023 Acute on chronic systolic (congestive) heart failure: Secondary | ICD-10-CM

## 2018-08-19 MED ORDER — POTASSIUM CHLORIDE ER 10 MEQ PO TBCR: 10.0000 meq | EXTENDED_RELEASE_TABLET | Freq: Every day | ORAL | 3 refills | Status: DC

## 2018-08-19 MED ORDER — SPIRONOLACTONE 25 MG PO TABS: 12.5000 mg | ORAL_TABLET | Freq: Every day | ORAL | 3 refills | Status: DC

## 2018-08-19 MED ORDER — EMPAGLIFLOZIN 10 MG PO TABS: 10.0000 mg | ORAL_TABLET | Freq: Every day | ORAL | 3 refills | Status: DC

## 2018-08-19 MED FILL — JARDIANCE 10 MG TABLET: 10 | 90 days supply | Qty: 90 | Fill #0

## 2018-08-19 MED FILL — POTASSIUM CHLORIDE CRYS ER: 10 | 90 days supply | Qty: 90 | Fill #0

## 2018-08-19 MED FILL — SPIRONOLACTONE 25 MG TABLET: 25 | 90 days supply | Qty: 45 | Fill #0

## 2018-08-19 NOTE — Progress Notes (Signed)
Spoke with pt and she states she needs to follow-up about COVID symptoms and FMLA paper work. She also states her right arm has been week x 1 weeks so she has been concerned about that. She states she is also having weakness and fatigue still from [place] and would like to know what her next steps would be at this time.

## 2018-08-19 NOTE — Patient Instructions (Signed)
Medication Instructions:  Your physician has recommended you make the following change in your medication:   START Spironolactone (Aldactone) 12.5 mg once daily START Jardiance (Empaglifozin) 10 mg once daily DECREASE Potassium chloride (Kdur) to 10 mEq once daily  If you need a refill on your cardiac medications before your next appointment, please call your pharmacy.   Lab work: TODAY - Medical laboratory scientific officer  Your physician recommends that you return for lab work in: 2 weeks on August 4  If you have labs (blood work) drawn today and your tests are completely normal, you will receive your results only by: Marland Kitchen MyChart Message (if you have MyChart) OR . A paper copy in the mail If you have any lab test that is abnormal or we need to change your treatment, we will call you to review the results.   Testing/Procedures: None Ordered   Follow-Up: Your physician recommends that you return for a follow-up appointment on Friday August 28 at 7:40 am with Dr. Acie Fredrickson

## 2018-08-19 NOTE — Progress Notes (Signed)
Telemedicine Encounter- SOAP NOTE Established Patient  I discussed the limitations, risks, security and privacy concerns of performing an evaluation and management service by telephone and the availability of in person appointments. I also discussed with the patient that there may be a patient responsible charge related to this service. The patient expressed understanding and agreed to proceed.  This telephone encounter was conducted with the patient's  verbal consent via audio telecommunications: yes Patient was instructed to have this encounter in a suitably private space; and to only have persons present to whom they give permission to participate. In addition, patient identity was confirmed by use of name plus two identifiers (DOB and address).  I spent a total of 25 min talking with the patient   Crystal Bryant is a 48 y.o. female established patient. Telephone visit today for COVID follow up, DM, SOB, s/p lung cancer, CHF  HPI COVID-pt with diagnosis and admission-reviewed ER and hospital discharge. Reviewed cardiology noted. Discussed pt with nurse coordinator for ongoing care assistance. Reviewed cxr x 2. Discussed by interdept message with cardo and oncology. Oncology reaffirms need for CT follow up prior to appt-pt was unable to see oncology due to + COVID diagnosis and no testing for follow up.  Pt has seen cardio x 2 with echo completed. Pt with appointment to see endo for initial evaluation. pts diabetes in poor control and current medications are not controlled glucose. Pt has not had a recent eye appointment. Pt was recently change to demadex for improved fluid control  Patient Active Problem List   Diagnosis Date Noted  . LBBB (left bundle branch block) 08/12/2018  . DM2 (diabetes mellitus, type 2) ([place]) 07/17/2018  . COVID-19 virus infection 07/09/2018  . Acute on chronic systolic CHF (congestive heart failure) ([place]) 07/09/2018  . Hypertensive disorder 05/13/2017  .  Diabetes mellitus ([place]) 05/13/2017  . History of malignant neoplasm of upper lobe bronchus or lung 03/02/2017  . Status post lobectomy of lung 03/02/2017  . Hyperlipidemia LDL goal <100 01/05/2017  . Essential hypertension 01/05/2017  . Type 2 diabetes mellitus with hyperglycemia, without long-term current use of insulin ([place]) 01/05/2017  . Postoperative visit 10/25/2015  . Malignant neoplasm of upper lobe of right lung ([place]) 09/21/2015    Past Medical History:  Diagnosis Date  . Anxiety   . CHF (congestive heart failure) ([place])   . Diabetes mellitus without complication ([place])   . Hypertension   . Lung cancer, upper lobe ([place]) 09/2015   Non-small cell in right upper lobe treated with RULectomy by VATS at [facility]    Current Outpatient Medications  Medication Sig Dispense Refill  . acetaminophen (TYLENOL) 500 MG tablet Take 1,000 mg by mouth every 6 (six) hours as needed for mild pain or moderate pain.    Marland Kitchen albuterol (VENTOLIN HFA) 108 (90 Base) MCG/ACT inhaler Inhale 2 puffs into the lungs every 4 (four) hours as needed for wheezing or shortness of breath.    . ALPRAZolam (XANAX) 0.5 MG tablet Take 1 tablet (0.5 mg total) by mouth at bedtime as needed for anxiety. 30 tablet 0  . carvedilol (COREG) 25 MG tablet Take 1.5 tablets (37.5 mg total) by mouth 2 (two) times daily. 270 tablet 3  . empagliflozin (JARDIANCE) 10 MG TABS tablet Take 10 mg by mouth daily. 90 tablet 3  . potassium chloride (K-DUR) 10 MEQ tablet Take 1 tablet (10 mEq total) by mouth daily. 90 tablet 3  . sacubitril-valsartan (ENTRESTO) 97-103 MG Take 1  tablet by mouth 2 (two) times daily. 60 tablet 11  . sitaGLIPtin-metformin (JANUMET) 50-1000 MG tablet Take 1 tablet 2 (two) times daily with a meal by mouth. 60 tablet 3  . spironolactone (ALDACTONE) 25 MG tablet Take 0.5 tablets (12.5 mg total) by mouth daily. 45 tablet 3  . torsemide (DEMADEX) 20 MG tablet Take 1 tablet (20 mg total) by mouth daily. 30 tablet 11   No  current facility-administered medications for this visit.     No Known Allergies  Social History   Socioeconomic History  . Marital status: Divorced    Spouse name: Not on file  . Number of children: 2  . Years of education: Not on file  . Highest education level: Not on file  Occupational History  . Occupation: Patient Engineer, manufacturing systems: [employer]  . Financial resource strain: Not hard at all  . Food insecurity    Worry: Never true    Inability: Never true  . Transportation needs    Medical: No    Non-medical: No  Tobacco Use  . Smoking status: Never Smoker  . Smokeless tobacco: Never Used  Substance and Sexual Activity  . Alcohol use: No  . Drug use: No  . Sexual activity: Yes    Birth control/protection: Surgical  Lifestyle  . Physical activity    Days per week: 0 days    Minutes per session: 0 min  . Stress: Rather much  Relationships  . Social Herbalist on phone: Three times a week    Gets together: Three times a week    Attends religious service: More than 4 times per year    Active member of club or organization: No    Attends meetings of clubs or organizations: Never    Relationship status: Divorced  . Intimate partner violence    Fear of current or ex partner: No    Emotionally abused: No    Physically abused: No    Forced sexual activity: No  Other Topics Concern  . Not on file  Social History Narrative  . Not on file    Review of Systems  Constitutional: Positive for malaise/fatigue. Negative for chills, fever and weight loss.  HENT: Positive for congestion. Negative for sinus pain and sore throat.   Respiratory: Positive for cough. Negative for shortness of breath and wheezing.   Cardiovascular: Negative for chest pain and palpitations.  Gastrointestinal: Negative for diarrhea and nausea.  Genitourinary: Negative for dysuria.  Neurological: Positive for speech change and headaches. Negative for  dizziness.  Psychiatric/Behavioral: Positive for depression. The patient is nervous/anxious.     Objective   Vitals as reported by the patient: No vitals today 179 fasting glucose , 250 fasting glucose-in the past few days 1. Malignant neoplasm of upper lobe of right lung [facility]) Oncology appt-abnormal cxr-d/w pt need for CT follow up -discussed with oncology-CT with contrast prior to f/u with oncology-requested sooner visit than in Sept due to concerns of abnormal cxr - CT Chest W Contrast; Future  2. Type 2 diabetes mellitus with hyperosmolar coma, with long-term current use of insulin (HCC) Pt to see endo for additional medication: using Janumet and cardio added Jardiance for additional control. Pt used insulin while hospitalized to control blood glucose but pt ha not used insulin in the past. A1c greater than 14  3. Essential hypertension Reviewed recent bp at cardio this morning-stablized-encouraged pt to purchase a monitor to follow pressures-continue  coreg  4. Acute on chronic systolic CHF (congestive heart failure) ([place]) Cardio following -Entresto BID -cardio suggested aldactone and now demadex for fluid maintenance-likely per pt to need a pacer   5. History of malignant neoplasm of upper lobe bronchus or lung Oncology pending I discussed the assessment and treatment plan with the patient. The patient was provided an opportunity to ask questions and all were answered. The patient agreed with the plan and demonstrated an understanding of the instructions.   The patient was advised to call back or seek an in-person evaluation if the symptoms worsen or if the condition fails to improve as anticipated.  I provided 34minutes of non-face-to-face time during this encounter.  LISA Hannah Beat, MD  Primary Care at [facility] 08-19-18

## 2018-08-20 ENCOUNTER — Other Ambulatory Visit (HOSPITAL_COMMUNITY)
Admission: RE | Admit: 2018-08-20 | Discharge: 2018-08-20 | Disposition: A | Discharge status: Home / Self Care | Payer: 59 | Source: Ambulatory Visit | Attending: Cardiovascular Disease | Admitting: Cardiovascular Disease

## 2018-08-20 ENCOUNTER — Other Ambulatory Visit: Payer: Self-pay

## 2018-08-20 DIAGNOSIS — I5043 Acute on chronic combined systolic (congestive) and diastolic (congestive) heart failure: Secondary | ICD-10-CM | POA: Insufficient documentation

## 2018-08-20 DIAGNOSIS — I447 Left bundle-branch block, unspecified: Secondary | ICD-10-CM | POA: Insufficient documentation

## 2018-08-20 LAB — BASIC METABOLIC PANEL
Anion gap: 9 (ref 5–15)
BUN: 14 mg/dL (ref 6–20)
CO2: 24 mmol/L (ref 22–32)
Calcium: 9.1 mg/dL (ref 8.9–10.3)
Chloride: 103 mmol/L (ref 98–111)
Creatinine, Ser: 0.67 mg/dL (ref 0.44–1.00)
GFR calc Af Amer: >60 mL/min (ref >60)
GFR calc non Af Amer: >60 mL/min (ref >60)
Glucose, Bld: 200 mg/dL — ABNORMAL HIGH (ref 70–99)
Potassium: 3.8 mmol/L (ref 3.5–5.1)
Sodium: 136 mmol/L (ref 135–145)

## 2018-08-21 ENCOUNTER — Telehealth: Payer: Self-pay | Admitting: Family Medicine

## 2018-08-21 NOTE — Telephone Encounter (Signed)
Copied from [place] [id number]. Topic: General - Inquiry >> Aug 20, 2018 11:28 AM Alanda Slim E wrote: Reason for CRM:Pt called to see if updated FMLA paperwork along with yesterday's office visit notes, that was completed by Dr. Holly Bodily can be sent to her mychart or emailed to her at  [email or web address] please advise

## 2018-08-22 ENCOUNTER — Other Ambulatory Visit: Payer: Self-pay | Admitting: *Deleted

## 2018-08-22 NOTE — Telephone Encounter (Signed)
Dr Holly Bodily When FMLA forms are completed can be sent to [email or web address]

## 2018-08-22 NOTE — Patient Outreach (Signed)
[place] [facility]) Care Management  08/22/2018  Crystal Bryant [birth year] [id number]  Care Coordination  Spoke with  [facility] Solution Manager, RN, CDE via phone after she called this RNCM to advise that Crystal Bryant's weight increased by 7 lb overnight from 7/21 to 7/22 per the [facility]. Almyra Free says [name] is not responding to her text messages in [place] regarding her concern over the weight gain and the ned for her to notify her provider. Attempted to reach [place] on her mobile number; no answer. Left message advising her to call the 24 hour Nurse Advice Line at [phone] if she has question or concerns over the weekend and/or after business hours in regards to any ehlath issues . Also advised [name] this RNCM will make another outreach next week to assess her clinical status.  Plan: Will make another outreach to [place] next week.   Barrington Ellison RN,CCM,CDE [place] Management Coordinator Office Phone [phone] Office Fax [phone]

## 2018-08-25 ENCOUNTER — Telehealth: Payer: Self-pay | Admitting: Family Medicine

## 2018-08-25 ENCOUNTER — Telehealth: Payer: Self-pay | Admitting: Rehabilitative and Restorative Service Providers"

## 2018-08-25 NOTE — Telephone Encounter (Signed)
Pt called to pick up ppr work for [employer] work. We faxed it but needs to be faxed to anioher location as well . Easiest for pt to pick up . Please call pt when her ppr work is ready to be picked FR

## 2018-08-26 ENCOUNTER — Ambulatory Visit: Payer: Self-pay | Admitting: *Deleted

## 2018-08-27 ENCOUNTER — Other Ambulatory Visit: Payer: Self-pay | Admitting: *Deleted

## 2018-08-27 ENCOUNTER — Telehealth: Payer: Self-pay | Admitting: Family Medicine

## 2018-08-27 ENCOUNTER — Other Ambulatory Visit: Payer: Self-pay | Admitting: Family Medicine

## 2018-08-27 MED ORDER — ACCU-CHEK AVIVA VI STRP: ORAL_STRIP | 1 refills | Status: DC

## 2018-08-27 NOTE — Telephone Encounter (Signed)
We have notified pt that paperwork is ready for pick up. It is upfront ready for her and a copy has been placed in scan and provider box

## 2018-08-27 NOTE — Patient Outreach (Signed)
[place] [facility]) Care Management  08/27/2018  Crystal Bryant Feb 13, 1970 [id number]   Care Coordination phone call  After reviewing the electronic medical record, reached [facility] via her mobile number and advised her that Dr Holly Bodily has faxed the leave of absence paperwork to [facility] and to Mingo Amber, [place] and Disability Specialist.  Also informed Crystal Bryant that Dr. Holly Bodily ordered refills for her test strips and suggested she have the [facility] OP pharmacy courier the strips to the [place] for Bryant to pick up.  Encouraged Crystal Bryant to contact this RNCM for any future care management needs or concerns.  Barrington Ellison RN,CCM,CDE [place] Management Coordinator Office Phone [phone] Office Fax [phone]

## 2018-08-27 NOTE — Patient Outreach (Addendum)
[facility]) Care Management  08/27/2018  Danasia Baker 1970-02-27 [id number]  Care Coordination  Subjective: Vertis Kelch via mobile number, 2 HIPAA identifiers verified;  to assist her with faxing leave of absence paperwork to the appropriate destinations and to address other care management needs.  Ivor Costa that [place] called this RNCM yesterday in regards to [place] with faxing leave of absence paperwork after Lakedra contacted her office and requested it be faxed to another location in addition to [place].  Dr Holly Bodily said she completed the paperwork and it was faxed to [facility] on 7/17 by her office staff and they have fax confirmation.  Lavoris states the paperwork needs to be faxed to [facility] for job protection and that she provided the fax number and contact number to Primary Care at [facility].  Miyu says she continues to weigh herself daily and is aware when she should notify the cardiologist for weight gain or symptoms of heart failure exacerbation. Shynice is voicing frustration that her fasting blood sugars remain high, >200, (reports fasting blood sugar yesterday was 243) despite her ongoing anorexia and loss of taste and smell due to the Covid 19 infection with minimal food intake. She says she feels nauseated often and she attributes this to her Janumet when she takes the medication on an empty stomach because the administration time in the [facility] platform is 8:00 am. She also says she is out of test strips and does not have a prescription for test strip refill.    Plan: With [place]'s permission, this RNCM will contact Dr Holly Bodily and request the leave of absence paperwork be faxed both to [employer] to [facility] attention and to [employer] and Disability Specialist [employer]. Also requested Dr Holly Bodily fax a prescription to the [facility] OP pharmacy for Accu-Check Guide test strips.   Teaching done with [facility] regarding  the reasons for elevated fasting blood sugars such as insulin deficit, illness and stress. Also advised her Dr. Dorris Fetch will address this when she sees him on 09/03/18 and that he may prescribe basal insulin. 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  Reminded her the [facility] program benefits related to testing supplies and medications will go into effect on 09/30/18.  [place] and asked her to change Eryn's 8 am scheduled medication administration time to 11 am to allow Kirk time to eat prior to taking her medications to lessen assist the  nausea and GI upset that may be related to [place].   Barrington Ellison RN,CCM,CDE [place] Management Coordinator Office Phone [phone] Office Fax [phone]

## 2018-08-27 NOTE — Telephone Encounter (Signed)
FMLA paperwork faxed to [employer] Celestia Khat at [phone]. And to [employer] LVM for Caryl Pina to see if she received paperwork at fax number [phone]

## 2018-08-28 ENCOUNTER — Telehealth: Payer: Self-pay

## 2018-08-28 MED ORDER — FREESTYLE LITE TEST VI STRP: ORAL_STRIP | 1 refills | Status: AC

## 2018-08-28 MED ORDER — FREESTYLE LANCETS MISC: 1 refills | Status: AC

## 2018-08-28 MED ORDER — FREESTYLE LITE DEVI: 0 refills | Status: DC

## 2018-08-28 MED FILL — FREESTYLE LANCETS: 25 days supply | Qty: 100 | Fill #0

## 2018-08-28 MED FILL — FREESTYLE LITE METER: 30 days supply | Qty: 1 | Fill #0

## 2018-08-28 MED FILL — FREESTYLE LITE TEST STRIP: 25 days supply | Qty: 100 | Fill #0

## 2018-08-28 NOTE — Telephone Encounter (Signed)
Pt called, frustrated with experience in last 20 phone calls.  More specifically upset with FMLA paperwork.  Attempted to obtain more info about what has been sent, info on RTW date.  Pt became tearful and disconnected call.  Notified practice admin.

## 2018-08-28 NOTE — Telephone Encounter (Signed)
Crystal Bryant pharmacy call to confirm strips ordered.  Epic shows ereceipt confirmed.  Gave verbal for strips.  Test 3-4 times per day d/t uncontrolled.

## 2018-08-28 NOTE — Telephone Encounter (Signed)
I have reordered the Glucose meter and supplies per the request of pharmacy. Freestyle lite meter, Freestyle lite test strips and and Freestyle Lancets.

## 2018-08-28 NOTE — Telephone Encounter (Signed)
Crystal Bryant returned my call. States she received the paperwork. She is working remotely and will return to the office next week and will reach out if they need additional info

## 2018-08-29 ENCOUNTER — Other Ambulatory Visit: Payer: Self-pay | Admitting: *Deleted

## 2018-08-29 NOTE — Patient Outreach (Signed)
[place] [facility]) Care Management  08/29/2018  Crystal Bryant 07-28-1970 [id number]  Received call from [facility] advising this RNCM that [name]'s weight has increased 2 and 3 lbs respectively over the last 2 days. She was sent an urgent message in [facility] but she has not responded to the message.  Attempted to each [place] via phone to review heart failure action plan and assess clinical status. No answer;  left voice mail advising her to call the cardiologist on call if needed or to call the [place] Management 24 Hours Nurse Advice Line at [phone] for health concerns.  Will plan to make another outreach to [name] next week to assess clinical status and address any care management needs.  Barrington Ellison RN,CCM,CDE [place] Management Coordinator Office Phone [phone] Office Fax [phone]

## 2018-09-01 ENCOUNTER — Ambulatory Visit (HOSPITAL_COMMUNITY)
Admission: RE | Admit: 2018-09-01 | Discharge: 2018-09-01 | Disposition: A | Discharge status: Home / Self Care | Payer: 59 | Source: Ambulatory Visit | Attending: Family Medicine | Admitting: Family Medicine

## 2018-09-01 ENCOUNTER — Other Ambulatory Visit: Payer: Self-pay

## 2018-09-01 ENCOUNTER — Telehealth: Payer: Self-pay

## 2018-09-01 DIAGNOSIS — C3411 Malignant neoplasm of upper lobe, right bronchus or lung: Secondary | ICD-10-CM | POA: Insufficient documentation

## 2018-09-01 MED ORDER — IOHEXOL 300 MG/ML  SOLN
100.0000 mL | Freq: Once | INTRAMUSCULAR | Status: AC | PRN
  Administered 2018-09-01: 75 mL via INTRAVENOUS

## 2018-09-01 NOTE — Telephone Encounter (Signed)
Pt calling to f/u on FMLA paperwork.  Able to find FMLA paperwork in media.  Per patient, need to only cross out/update/initial return to work date and refax.  Confirmed [employer] fax number w patient.  RTW updated to 09/09/2018 and signed by Dr. Nolon Rod, covering for Dr. Holly Bodily.  Faxed to [employer].  Confirmation received.  TC to pt, l/m to advise.

## 2018-09-02 ENCOUNTER — Other Ambulatory Visit: Payer: 59

## 2018-09-03 ENCOUNTER — Encounter: Payer: Self-pay | Admitting: Registered Nurse

## 2018-09-03 ENCOUNTER — Telehealth: Payer: Self-pay

## 2018-09-03 ENCOUNTER — Other Ambulatory Visit: Payer: Self-pay

## 2018-09-03 ENCOUNTER — Ambulatory Visit (INDEPENDENT_AMBULATORY_CARE_PROVIDER_SITE_OTHER): Payer: 59 | Admitting: "Endocrinology

## 2018-09-03 ENCOUNTER — Encounter (HOSPITAL_COMMUNITY): Payer: Self-pay | Admitting: *Deleted

## 2018-09-03 ENCOUNTER — Encounter: Payer: Self-pay | Admitting: "Endocrinology

## 2018-09-03 VITALS — BP 120/83 | HR 89 | Ht 65.0 in | Wt 138.0 lb

## 2018-09-03 DIAGNOSIS — I10 Essential (primary) hypertension: Secondary | ICD-10-CM

## 2018-09-03 DIAGNOSIS — E1159 Type 2 diabetes mellitus with other circulatory complications: Secondary | ICD-10-CM

## 2018-09-03 LAB — GLUCOSE, POCT (MANUAL RESULT ENTRY): POC Glucose: 286 mg/dl — AB (ref 70–99)

## 2018-09-03 MED ORDER — ROSUVASTATIN CALCIUM 20 MG PO TABS: 20.0000 mg | ORAL_TABLET | Freq: Every day | ORAL | 3 refills | Status: DC

## 2018-09-03 NOTE — Patient Instructions (Signed)

## 2018-09-03 NOTE — Telephone Encounter (Signed)
Copy of FMLA paperwork placed at front desk for pick up.

## 2018-09-03 NOTE — Progress Notes (Signed)
Endocrinology Consult Note       09/03/2018, 6:46 PM   Subjective:    Patient ID: Crystal Bryant, female    DOB: August 11, 1970.  Crystal Bryant is being seen in consultation for management of currently uncontrolled symptomatic diabetes requested by  Maximiano Coss, NP.   Past Medical History:  Diagnosis Date  . Anxiety   . CHF (congestive heart failure) ([place])   . Diabetes mellitus without complication ([place])   . Hypertension   . Lung cancer, upper lobe ([place]) 09/2015   Non-small cell in right upper lobe treated with RULectomy by VATS at [facility]    Past Surgical History:  Procedure Laterality Date  . ABDOMINAL HYSTERECTOMY    . BREAST BIOPSY Right 02/10/2018   Biopsy of upper outer quadrant of right breast due to calcifications seen on mammography revealed fat necrosis with calcifications.  Benign so continue annual screening mammograms.  Marland Kitchen BREAST SURGERY    . CESAREAN SECTION    . LOBECTOMY Right 10/06/2015   [place] lung cancer treated with VATS right upper lobectomy by Dr. [name] at [facility]    Social History   Socioeconomic History  . Marital status: Divorced    Spouse name: Not on file  . Number of children: 2  . Years of education: Not on file  . Highest education level: Not on file  Occupational History  . Occupation: Patient Engineer, manufacturing systems: [employer]  . Financial resource strain: Not hard at all  . Food insecurity    Worry: Never true    Inability: Never true  . Transportation needs    Medical: No    Non-medical: No  Tobacco Use  . Smoking status: Never Smoker  . Smokeless tobacco: Never Used  Substance and Sexual Activity  . Alcohol use: No  . Drug use: No  . Sexual activity: Yes    Birth control/protection: Surgical  Lifestyle  . Physical activity    Days per week: 0 days    Minutes per session: 0 min  . Stress: Rather much  Relationships  .  Social Herbalist on phone: Three times a week    Gets together: Three times a week    Attends religious service: More than 4 times per year    Active member of club or organization: No    Attends meetings of clubs or organizations: Never    Relationship status: Divorced  Other Topics Concern  . Not on file  Social History Narrative  . Not on file    Family History  Problem Relation Age of Onset  . Diabetes Mother   . Hypertension Mother   . Stroke Father   . Breast cancer Paternal Aunt   . Breast cancer Paternal Aunt   . Breast cancer Paternal Aunt   . Breast cancer Paternal Aunt     Outpatient Encounter Medications as of 09/03/2018  Medication Sig  . ALPRAZolam (XANAX) 0.5 MG tablet Take 1 tablet (0.5 mg total) by mouth at bedtime as needed for anxiety.  . carvedilol (COREG) 25 MG tablet Take 1.5 tablets (37.5 mg total)  by mouth 2 (two) times daily.  . potassium chloride (K-DUR) 10 MEQ tablet Take 1 tablet (10 mEq total) by mouth daily.  . sacubitril-valsartan (ENTRESTO) 97-103 MG Take 1 tablet by mouth 2 (two) times daily.  . sitaGLIPtin-metformin (JANUMET) 50-1000 MG tablet Take 1 tablet 2 (two) times daily with a meal by mouth.  . spironolactone (ALDACTONE) 25 MG tablet Take 0.5 tablets (12.5 mg total) by mouth daily.  Marland Kitchen torsemide (DEMADEX) 20 MG tablet Take 1 tablet (20 mg total) by mouth daily.  . [DISCONTINUED] empagliflozin (JARDIANCE) 10 MG TABS tablet Take 10 mg by mouth daily.  Marland Kitchen acetaminophen (TYLENOL) 500 MG tablet Take 1,000 mg by mouth every 6 (six) hours as needed for mild pain or moderate pain.  . Blood Glucose Monitoring Suppl (FREESTYLE LITE) DEVI Use As Directed  . glucose blood (FREESTYLE LITE) test strip Use as instructed  . Lancets (FREESTYLE) lancets Use as instructed  . [DISCONTINUED] albuterol (VENTOLIN HFA) 108 (90 Base) MCG/ACT inhaler Inhale 2 puffs into the lungs every 4 (four) hours as needed for wheezing or shortness of breath.   No  facility-administered encounter medications on file as of 09/03/2018.     ALLERGIES: No Known Allergies  VACCINATION STATUS:  There is no immunization history on file for this patient.  Diabetes She presents for her initial diabetic visit. She has type 2 diabetes mellitus. Onset time: She was diagnosed at approximate age of 44 years. Her disease course has been worsening. There are no hypoglycemic associated symptoms. Pertinent negatives for hypoglycemia include no confusion, headaches, pallor or seizures. Associated symptoms include fatigue, polydipsia and polyuria. Pertinent negatives for diabetes include no chest pain and no polyphagia. There are no hypoglycemic complications. Symptoms are worsening. Diabetic complications include heart disease. Risk factors for coronary artery disease include hypertension, diabetes mellitus and sedentary lifestyle. Current diabetic treatments: She is currently on Jardiance 10 mg p.o. daily, Janumet 50/1000 mg p.o. twice daily. Her weight is decreasing steadily. She is following a generally unhealthy diet. When asked about meal planning, she reported none. She has not had a previous visit with a dietitian. She never participates in exercise. (She did not bring any logs nor meter to review.  Her most recent A1c was 14%.  Her previous 2 measurements are 13.7% and greater than 14%.) An ACE inhibitor/angiotensin II receptor blocker is being taken.  Hypertension This is a chronic problem. The current episode started more than 1 year ago. Pertinent negatives include no chest pain, headaches, palpitations or shortness of breath. Risk factors for coronary artery disease include diabetes mellitus, sedentary lifestyle and family history. Past treatments include diuretics, beta blockers and angiotensin blockers. The current treatment provides moderate improvement. Hypertensive end-organ damage includes heart failure.     Review of Systems  Constitutional: Positive for  fatigue. Negative for chills, fever and unexpected weight change.  HENT: Negative for trouble swallowing and voice change.   Eyes: Negative for visual disturbance.  Respiratory: Negative for cough, shortness of breath and wheezing.   Cardiovascular: Negative for chest pain, palpitations and leg swelling.  Gastrointestinal: Negative for diarrhea, nausea and vomiting.  Endocrine: Positive for polydipsia and polyuria. Negative for cold intolerance, heat intolerance and polyphagia.  Musculoskeletal: Negative for arthralgias and myalgias.  Skin: Negative for color change, pallor, rash and wound.  Neurological: Negative for seizures and headaches.  Psychiatric/Behavioral: Negative for confusion and suicidal ideas.    Objective:    BP 120/83   Pulse 89   Ht 5\' 5"  (1.651  m)   Wt 138 lb (62.6 kg)   BMI 22.96 kg/m   Wt Readings from Last 3 Encounters:  09/03/18 138 lb (62.6 kg)  08/19/18 139 lb 12.8 oz (63.4 kg)  08/12/18 144 lb 12.8 oz (65.7 kg)     Physical Exam Constitutional:      Appearance: She is well-developed.  HENT:     Head: Normocephalic and atraumatic.  Neck:     Musculoskeletal: Normal range of motion and neck supple.     Thyroid: No thyromegaly.     Trachea: No tracheal deviation.  Cardiovascular:     Rate and Rhythm: Normal rate and regular rhythm.  Pulmonary:     Effort: Pulmonary effort is normal.  Abdominal:     Tenderness: There is no abdominal tenderness. There is no guarding.  Musculoskeletal: Normal range of motion.  Skin:    General: Skin is warm and dry.     Coloration: Skin is not pale.     Findings: No erythema or rash.  Neurological:     Mental Status: She is alert and oriented to person, place, and time.     Cranial Nerves: No cranial nerve deficit.     Coordination: Coordination normal.     Deep Tendon Reflexes: Reflexes are normal and symmetric.  Psychiatric:        Judgment: Judgment normal.     Comments: Patient has reluctant affect.        CMP ( most recent) CMP     Component Value Date/Time   NA 136 08/20/2018 1025   NA 138 05/28/2017 1343   K 3.8 08/20/2018 1025   CL 103 08/20/2018 1025   CO2 24 08/20/2018 1025   GLUCOSE 200 (H) 08/20/2018 1025   BUN 14 08/20/2018 1025   BUN 11 05/28/2017 1343   CREATININE 0.67 08/20/2018 1025   CALCIUM 9.1 08/20/2018 1025   PROT 6.9 07/15/2018 0408   PROT 7.1 01/05/2017 0954   ALBUMIN 3.0 (L) 07/15/2018 0408   ALBUMIN 4.0 01/05/2017 0954   AST 14 (L) 07/15/2018 0408   ALT 22 07/15/2018 0408   ALKPHOS 94 07/15/2018 0408   BILITOT 0.7 07/15/2018 0408   BILITOT <0.2 01/05/2017 0954   GFRNONAA >60 08/20/2018 1025   GFRAA >60 08/20/2018 1025     Diabetic Labs (most recent): Lab Results  Component Value Date   HGBA1C 14.1 (H) 07/10/2018   HGBA1C 13.7 (H) 07/09/2018   HGBA1C >14.0 12/15/2016     Lipid Panel ( most recent) Lipid Panel     Component Value Date/Time   CHOL 278 (H) 12/15/2016 1201   TRIG 171 (H) 07/09/2018 1015   HDL 45 12/15/2016 1201   CHOLHDL 6.2 (H) 12/15/2016 1201   LDLCALC 197 (H) 12/15/2016 1201      Lab Results  Component Value Date   TSH 0.455 06/04/2017      Assessment & Plan:   1. DM type 2 causing vascular disease ([place])  - Crystal Bryant has currently uncontrolled symptomatic type 2 DM since  48 years of age,  with most recent A1c of 14 %. Recent labs reviewed. - I had a long discussion with her about the progressive nature of diabetes and the pathology behind its complications. -her diabetes is complicated by congestive heart failure, sedentary life and she remains at a high risk for more acute and chronic complications which include CAD, CVA, CKD, retinopathy, and neuropathy. These are all discussed in detail with her.  - I have counseled her  on diet management by adopting a carbohydrate restricted/protein rich diet. - she admits that there is a room for improvement in her food and drink choices. - Suggestion is made  for her to avoid simple carbohydrates  from her diet including Cakes, Sweet Desserts, Ice Cream, Soda (diet and regular), Sweet Tea, Candies, Chips, Cookies, Store Bought Juices, Alcohol in Excess of  1-2 drinks a day, Artificial Sweeteners,  Coffee Creamer, and "Sugar-free" Products. This will help patient to have more stable blood glucose profile and potentially avoid unintended weight gain.  - I encouraged her to switch to  unprocessed or minimally processed complex starch and increased protein intake (animal or plant source), fruits, and vegetables.  - she is advised to stick to a routine mealtimes to eat 3 meals  a day and avoid unnecessary snacks ( to snack only to correct hypoglycemia).   - she will be scheduled with Jearld Fenton, RDN, CDE for diabetes education.  - I have approached her with the following individualized plan to manage  her diabetes and patient agrees:   -Patient will likely require insulin treatment in order for her to achieve control of diabetes to target. -However, she is hesitant and overwhelmed this morning, willing to start monitoring blood glucose. -I implored her to start monitoring blood glucose 4 times a day and return in 10 days with her meter and logs for evaluation. -In the meantime, she will be taken off of Jardiance due to high risk body habitus. -She is advised to continue Janumet 50/1000 mg p.o. twice daily. - Adjustment parameters are given to her for hypo and hyperglycemia in writing. - she is encouraged to call clinic for blood glucose levels less than 70 or above 300 mg /dl.  - Patient specific target  A1c;  LDL, HDL, Triglycerides, and  Waist Circumference were discussed in detail.  2) Blood Pressure /Hypertension:  her blood pressure is  controlled to target.   she is advised to continue her current medications including carvedilol 37.5 mg p.o. twice daily, spironolactone 25 mg p.o. daily.  She is also on Entresto which has valsartan.  3)  Lipids/Hyperlipidemia:   Review of her recent lipid panel showed  controlled  LDL at 197 .  I discussed and initiated Crestor 20 mg p.o. nightly.  Side effects and precautions discussed with her. 4)  Weight/Diet:  Body mass index is 22.96 kg/m.  -   She is not a weight loss candidate.  CDE Consult will be initiated . Exercise, and detailed carbohydrates information provided  -  detailed on discharge instructions.  5) Chronic Care/Health Maintenance:  -she  is on ARB and Statin medications and  is encouraged to initiate and continue to follow up with Ophthalmology, Dentist,  Podiatrist at least yearly or according to recommendations, and advised to   stay away from smoking. I have recommended yearly flu vaccine and pneumonia vaccine at least every 5 years; moderate intensity exercise for up to 150 minutes weekly; and  sleep for at least 7 hours a day.  - she is  advised to maintain close follow up with Maximiano Coss, NP for primary care needs, as well as her other providers for optimal and coordinated care.  - Time spent with the patient: 45 minutes, of which >50% was spent in obtaining information about her symptoms, reviewing her previous labs/studies, evaluations, and treatments, counseling her about her currently uncontrolled and complicated type 2 diabetes; hyperlipidemia, hypertension, and developing plans for long term treatment based on the  latest standards of care/guidelines.  Please refer to " Patient Self Inventory" in the Media  tab for reviewed elements of pertinent patient history.  [facility] participated in the discussions, expressed understanding, and voiced agreement with the above plans.  All questions were answered to her satisfaction. she is encouraged to contact clinic should she have any questions or concerns prior to her return visit.  Follow up plan: - Return in about 10 days (around 09/13/2018) for Follow up with Meter and Logs Only - no Labs.  Glade Lloyd, MD [facility] [address] [place], [place] [address] Phone: [phone]  Fax: [phone]    09/03/2018, 6:46 PM  This note was partially dictated with voice recognition software. Similar sounding words can be transcribed inadequately or may not  be corrected upon review.

## 2018-09-04 ENCOUNTER — Telehealth: Payer: Self-pay | Admitting: Family Medicine

## 2018-09-04 MED FILL — ROSUVASTATIN CALCIUM 20 MG: 20 | 90 days supply | Qty: 90 | Fill #0

## 2018-09-04 NOTE — Telephone Encounter (Signed)
Patient would like a cb about her CT results

## 2018-09-05 ENCOUNTER — Encounter (HOSPITAL_COMMUNITY): Payer: Self-pay | Admitting: *Deleted

## 2018-09-05 NOTE — Telephone Encounter (Signed)
Attempted to call pt and relay results. There was no answer so I left a general message to call back.  Results are relayed to pt on 09/04/18  Plan: Provider stated on 09/04/18  "CT scan is stable-oncology aware of CT being completed. Please keep appt with oncology next week".

## 2018-09-08 ENCOUNTER — Other Ambulatory Visit: Payer: Self-pay

## 2018-09-08 ENCOUNTER — Encounter (HOSPITAL_COMMUNITY): Payer: Self-pay

## 2018-09-08 ENCOUNTER — Inpatient Hospital Stay (HOSPITAL_COMMUNITY): Payer: 59 | Attending: Hematology | Admitting: Hematology

## 2018-09-08 ENCOUNTER — Encounter (HOSPITAL_COMMUNITY): Payer: Self-pay | Admitting: Hematology

## 2018-09-08 ENCOUNTER — Encounter: Payer: 59 | Admitting: Emergency Medicine

## 2018-09-08 ENCOUNTER — Other Ambulatory Visit: Payer: Self-pay | Admitting: *Deleted

## 2018-09-08 VITALS — HR 108 | Temp 97.5°F | Resp 18 | Ht 65.0 in | Wt 145.2 lb

## 2018-09-08 DIAGNOSIS — Z79899 Other long term (current) drug therapy: Secondary | ICD-10-CM | POA: Insufficient documentation

## 2018-09-08 DIAGNOSIS — R0600 Dyspnea, unspecified: Secondary | ICD-10-CM | POA: Diagnosis not present

## 2018-09-08 DIAGNOSIS — I509 Heart failure, unspecified: Secondary | ICD-10-CM | POA: Insufficient documentation

## 2018-09-08 DIAGNOSIS — E119 Type 2 diabetes mellitus without complications: Secondary | ICD-10-CM | POA: Insufficient documentation

## 2018-09-08 DIAGNOSIS — C3411 Malignant neoplasm of upper lobe, right bronchus or lung: Secondary | ICD-10-CM | POA: Diagnosis not present

## 2018-09-08 DIAGNOSIS — I10 Essential (primary) hypertension: Secondary | ICD-10-CM | POA: Diagnosis not present

## 2018-09-08 DIAGNOSIS — Z803 Family history of malignant neoplasm of breast: Secondary | ICD-10-CM | POA: Insufficient documentation

## 2018-09-08 DIAGNOSIS — Z8249 Family history of ischemic heart disease and other diseases of the circulatory system: Secondary | ICD-10-CM | POA: Insufficient documentation

## 2018-09-08 DIAGNOSIS — Z823 Family history of stroke: Secondary | ICD-10-CM | POA: Insufficient documentation

## 2018-09-08 DIAGNOSIS — I700 Atherosclerosis of aorta: Secondary | ICD-10-CM | POA: Diagnosis not present

## 2018-09-08 DIAGNOSIS — Z833 Family history of diabetes mellitus: Secondary | ICD-10-CM | POA: Diagnosis not present

## 2018-09-08 NOTE — Progress Notes (Signed)
[facility] CONSULT NOTE  Patient Care Team: Nahser, Wonda Cheng, MD as PCP - Cardiology (Cardiology)  CHIEF COMPLAINTS/PURPOSE OF CONSULTATION:  Right lung cancer.  HISTORY OF PRESENTING ILLNESS:  Crystal Bryant 48 y.o. female is seen in consultation today for further work-up and management of right lung cancer.  Patient presented with dyspnea and cough of 1 month duration and a chest x-ray on 07/29/2015 reportedly showed right upper lobe lung nodule.  This was followed with a CT scan of the chest on 09/14/2015 showing a 2.8 x 1.9 cm irregularly marginated mass in the anterior right upper lobe extending to the pleura.  She was referred to CT surgery at [facility].  On 10/06/2015 she underwent bronchoscopy, mediastinoscopy, right thoracoscopic upper lobectomy, mediastinal node dissection.  Pathology showed 2.1 cm moderately differentiated adenocarcinoma, PT2AN0.  Station 4R, 7 and 11 lymph nodes were negative.  She followed up at [facility] until March 2018 and was lost to follow-up.  She is a never smoker.  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  She is otherwise active.  She was recently diagnosed with congestive heart failure in July.  She has a longstanding history of hypertension for 25 years.  She sees Dr. Acie Fredrickson for heart failure.  Apparently she was recommended a Investment banker, corporate.  Denies any cough or hemoptysis.  Denies any fevers, night sweats or weight loss in the last 6 months.  She is in the process of finding a primary care doctor.  Appetite and energy levels are 50%.  No pain is reported.  Have minor issues with sleep which are stable.  She was recently hospitalized in June 2020 with COVID-19 for 7 days at [place]:  Past Medical History:  Diagnosis Date  . Anxiety   . CHF (congestive heart failure) ([place])   . Diabetes mellitus without complication ([place])   . Hypertension   . Lung cancer, upper lobe  ([place]) 09/2015   Non-small cell in right upper lobe treated with RULectomy by VATS at [place]: Past Surgical History:  Procedure Laterality Date  . ABDOMINAL HYSTERECTOMY    . BREAST BIOPSY Right 02/10/2018   Biopsy of upper outer quadrant of right breast due to calcifications seen on mammography revealed fat necrosis with calcifications.  Benign so continue annual screening mammograms.  Marland Kitchen BREAST SURGERY    . CESAREAN SECTION    . LOBECTOMY Right 10/06/2015   [place] lung cancer treated with VATS right upper lobectomy by Dr. [name] at [facility].    SOCIAL HISTORY: Social History   Socioeconomic History  . Marital status: Divorced    Spouse name: Not on file  . Number of children: 2  . Years of education: Not on file  . Highest education level: Not on file  Occupational History  . Occupation: Patient Engineer, manufacturing systems: [employer]  . Financial resource strain: Not hard at all  . Food insecurity    Worry: Never true    Inability: Never true  . Transportation needs    Medical: No    Non-medical: No  Tobacco Use  . Smoking status: Never Smoker  . Smokeless tobacco: Never Used  Substance and Sexual Activity  . Alcohol use: No  . Drug use: No  . Sexual activity: Yes    Birth control/protection: Surgical  Lifestyle  . Physical activity    Days per week: 0 days  Minutes per session: 0 min  . Stress: Rather much  Relationships  . Social Herbalist on phone: Three times a week    Gets together: Three times a week    Attends religious service: More than 4 times per year    Active member of club or organization: No    Attends meetings of clubs or organizations: Never    Relationship status: Divorced  . Intimate partner violence    Fear of current or ex partner: No    Emotionally abused: No    Physically abused: No    Forced sexual activity: No  Other Topics Concern  . Not on file  Social History Narrative  . Not on  file    FAMILY HISTORY: Family History  Problem Relation Age of Onset  . Diabetes Mother   . Hypertension Mother   . Stroke Father   . Breast cancer Paternal Aunt   . Breast cancer Paternal Aunt   . Breast cancer Paternal Aunt   . Breast cancer Paternal Aunt     ALLERGIES:  has No Known Allergies.  MEDICATIONS:  Current Outpatient Medications  Medication Sig Dispense Refill  . acetaminophen (TYLENOL) 500 MG tablet Take 1,000 mg by mouth every 6 (six) hours as needed for mild pain or moderate pain.    Marland Kitchen ALPRAZolam (XANAX) 0.5 MG tablet Take 1 tablet (0.5 mg total) by mouth at bedtime as needed for anxiety. 30 tablet 0  . Blood Glucose Monitoring Suppl (FREESTYLE LITE) DEVI Use As Directed 1 Device 0  . carvedilol (COREG) 25 MG tablet Take 1.5 tablets (37.5 mg total) by mouth 2 (two) times daily. 270 tablet 3  . glucose blood (FREESTYLE LITE) test strip Use as instructed 100 each 1  . Lancets (FREESTYLE) lancets Use as instructed 100 each 1  . potassium chloride (K-DUR) 10 MEQ tablet Take 1 tablet (10 mEq total) by mouth daily. 90 tablet 3  . rosuvastatin (CRESTOR) 20 MG tablet Take 1 tablet (20 mg total) by mouth daily. 90 tablet 3  . sacubitril-valsartan (ENTRESTO) 97-103 MG Take 1 tablet by mouth 2 (two) times daily. 60 tablet 11  . sitaGLIPtin-metformin (JANUMET) 50-1000 MG tablet Take 1 tablet 2 (two) times daily with a meal by mouth. 60 tablet 3  . spironolactone (ALDACTONE) 25 MG tablet Take 0.5 tablets (12.5 mg total) by mouth daily. 45 tablet 3  . torsemide (DEMADEX) 20 MG tablet Take 1 tablet (20 mg total) by mouth daily. 30 tablet 11   No current facility-administered medications for this visit.     REVIEW OF SYSTEMS:   Constitutional: Denies fevers, chills or abnormal night sweats Eyes: Denies blurriness of vision, double vision or watery eyes Ears, nose, mouth, throat, and face: Denies mucositis or sore throat Respiratory: Denies cough, dyspnea or  wheezes Cardiovascular: Denies palpitation, chest discomfort or lower extremity swelling Gastrointestinal:  Denies nausea, heartburn or change in bowel habits Skin: Denies abnormal skin rashes Lymphatics: Denies new lymphadenopathy or easy bruising Neurological:Denies numbness, tingling or new weaknesses Behavioral/Psych: Mood is stable, no new changes  All other systems were reviewed with the patient and are negative.  PHYSICAL EXAMINATION: ECOG PERFORMANCE STATUS: 0 - Asymptomatic  Vitals:   09/08/18 1300  Pulse: (!) 108  Resp: 18  Temp: (!) 97.5 F (36.4 C)  SpO2: 100%   Filed Weights   09/08/18 1300  Weight: 145 lb 4 oz (65.9 kg)    GENERAL:alert, no distress and comfortable SKIN: skin color,  texture, turgor are normal, no rashes or significant lesions EYES: normal, conjunctiva are pink and non-injected, sclera clear OROPHARYNX:no exudate, no erythema and lips, buccal mucosa, and tongue normal  NECK: supple, thyroid normal size, non-tender, without nodularity LYMPH:  no palpable lymphadenopathy in the cervical, axillary or inguinal LUNGS: clear to auscultation and percussion with normal breathing effort HEART: regular rate & rhythm and no murmurs and no lower extremity edema ABDOMEN:abdomen soft, non-tender and normal bowel sounds Musculoskeletal:no cyanosis of digits and no clubbing  PSYCH: alert & oriented x 3 with fluent speech NEURO: no focal motor/sensory deficits  LABORATORY DATA:  I have reviewed the data as listed Lab Results  Component Value Date   WBC 6.4 07/24/2018   HGB 11.6 (L) 07/24/2018   HCT 36.6 07/24/2018   MCV 86.1 07/24/2018   PLT 360 07/24/2018     Chemistry      Component Value Date/Time   NA 136 08/20/2018 1025   NA 138 05/28/2017 1343   K 3.8 08/20/2018 1025   CL 103 08/20/2018 1025   CO2 24 08/20/2018 1025   BUN 14 08/20/2018 1025   BUN 11 05/28/2017 1343   CREATININE 0.67 08/20/2018 1025      Component Value Date/Time    CALCIUM 9.1 08/20/2018 1025   ALKPHOS 94 07/15/2018 0408   AST 14 (L) 07/15/2018 0408   ALT 22 07/15/2018 0408   BILITOT 0.7 07/15/2018 0408   BILITOT <0.2 01/05/2017 0954       RADIOGRAPHIC STUDIES: I have personally reviewed the radiological images as listed and agreed with the findings in the report. Ct Chest W Contrast  Result Date: 09/02/2018 CLINICAL DATA:  Right upper lobe non-small cell lung carcinoma status post resection 10/06/2015. Restaging. EXAM: CT CHEST WITH CONTRAST TECHNIQUE: Multidetector CT imaging of the chest was performed during intravenous contrast administration. CONTRAST:  13mL OMNIPAQUE IOHEXOL 300 MG/ML  SOLN COMPARISON:  03/07/2017 chest CT. FINDINGS: Cardiovascular: Normal heart size. No significant pericardial effusion/thickening. Mildly atherosclerotic nonaneurysmal thoracic aorta. Normal caliber pulmonary arteries. No central pulmonary emboli. Mediastinum/Nodes: No discrete thyroid nodules. Unremarkable esophagus. No pathologically enlarged axillary, mediastinal or hilar lymph nodes. Lungs/Pleura: No pneumothorax. No pleural effusion. Status post right upper lobectomy. Stable bandlike scarring along a suture line in the peripheral lower left right lung. No acute consolidative airspace disease, lung masses or significant pulmonary nodules. Subpleural 3 mm nodule along the anterior left lower lobe (series 4/image 63) is stable and considered benign. Upper abdomen: No acute abnormality. Musculoskeletal:  No aggressive appearing focal osseous lesions. IMPRESSION: 1. No evidence of local tumor recurrence status post right upper lobectomy. 2. No findings of metastatic disease in the chest. Aortic Atherosclerosis (ICD10-I70.0). Electronically Signed   By: Ilona Sorrel M.D.   On: 09/02/2018 08:34    ASSESSMENT & PLAN:  Malignant neoplasm of upper lobe of right lung ([place]) 1.  T2a N0 adenocarcinoma of the right lung: - Chest x-ray on 09/08/2015 for 1 month history of dyspnea  and cough showed right upper lobe lung nodule.  CT chest on 09/14/2015 at [facility] showed 2.8 x 1.9 cm mass in the anterior right upper lobe extending to the pleura. - On 10/06/2015, she underwent bronchoscopy, mediastinoscopy, right thoracoscopic upper lobectomy and mediastinal lymph node dissection. -Pathology showed 2.1 cm moderately differentiated adenocarcinoma, PT2AN0, negative lymph nodes at 4R, 7 and 11 stations. - Last CT scan and follow-up visit with [facility] surgery was on 04/14/2016. - She denies any recent cough or hemoptysis.  No chest pains were reported. - She is a never smoker.  Physical exam today was within normal limits. -She was reportedly diagnosed with COVID-19 and was in the hospital for 7 days in June. - We reviewed results of CT scan chest with contrast dated 09/01/2018 which showed no evidence of local tumor recurrence.  No evidence of metastatic disease in the chest or upper abdomen. - I have discussed surveillance plan with her in detail.  She will be seen back in 6 months for follow-up with repeat CT scan.  2.  Congestive heart failure: - 2D echo on 08/11/2018 shows EF of 30-35%. -She will continue Coreg 37.5 mg twice daily, Entresto twice daily, Demadex 20 mg daily and spironolactone 25 mg half tablet daily.  3.  Family history: -3 paternal aunts had breast cancer.  2 paternal cousins also had breast cancer.  4.  Health maintenance: -Mammogram on 02/06/2018 showed calcifications associated density/mass in the upper outer far posterior right breast, BI-RADS Category 4. - Biopsy of the right breast posterior upper outer quadrant on 02/10/2018 shows fat necrosis with calcifications.  No malignancy identified.  Orders Placed This Encounter  Procedures  . CT Chest W Contrast    Standing Status:   Future    Standing Expiration Date:   09/08/2019    Order Specific Question:   ** REASON FOR EXAM (FREE TEXT)    Answer:   Hx lung cancer    Order Specific Question:   If  indicated for the ordered procedure, I authorize the administration of contrast media per Radiology protocol    Answer:   Yes    Order Specific Question:   Is patient pregnant?    Answer:   No    Order Specific Question:   Preferred imaging location?    Answer:   [facility]    Order Specific Question:   Radiology Contrast Protocol - do NOT remove file path    Answer:   \\[email or web address]    All questions were answered. The patient knows to call the clinic with any problems, questions or concerns.     Derek Jack, MD 09/08/2018 5:58 PM

## 2018-09-08 NOTE — Assessment & Plan Note (Addendum)
1.  T2a N0 adenocarcinoma of the right lung: - Chest x-ray on 09/08/2015 for 1 month history of dyspnea and cough showed right upper lobe lung nodule.  CT chest on 09/14/2015 at [facility] showed 2.8 x 1.9 cm mass in the anterior right upper lobe extending to the pleura. - On 10/06/2015, she underwent bronchoscopy, mediastinoscopy, right thoracoscopic upper lobectomy and mediastinal lymph node dissection. -Pathology showed 2.1 cm moderately differentiated adenocarcinoma, PT2AN0, negative lymph nodes at 4R, 7 and 11 stations. - Last CT scan and follow-up visit with [facility] surgery was on 04/14/2016. - She denies any recent cough or hemoptysis.  No chest pains were reported. - She is a never smoker.  Physical exam today was within normal limits. -She was reportedly diagnosed with COVID-19 and was in the hospital for 7 days in June. - We reviewed results of CT scan chest with contrast dated 09/01/2018 which showed no evidence of local tumor recurrence.  No evidence of metastatic disease in the chest or upper abdomen. - I have discussed surveillance plan with her in detail.  She will be seen back in 6 months for follow-up with repeat CT scan.  2.  Congestive heart failure: - 2D echo on 08/11/2018 shows EF of 30-35%. -She will continue Coreg 37.5 mg twice daily, Entresto twice daily, Demadex 20 mg daily and spironolactone 25 mg half tablet daily.  3.  Family history: -3 paternal aunts had breast cancer.  2 paternal cousins also had breast cancer.  4.  Health maintenance: -Mammogram on 02/06/2018 showed calcifications associated density/mass in the upper outer far posterior right breast, BI-RADS Category 4. - Biopsy of the right breast posterior upper outer quadrant on 02/10/2018 shows fat necrosis with calcifications.  No malignancy identified.

## 2018-09-08 NOTE — Patient Outreach (Signed)
[facility]) Care Management  09/08/2018  Mennie Spiller 1970/02/01 [id number]   Care Coordination Phone Call  Left message on Dominga's mobile number advising her this RNCM has received the free Glucerna supplements and that they can be picked up at the [facility] Management office. Await return call from [place]  Barrington Ellison RN,CCM,CDE [place] Management Coordinator Office Phone [phone] Office Fax [phone]

## 2018-09-08 NOTE — Patient Instructions (Signed)
[place] at [facility] Discharge Instructions  You were seen today by Dr. Delton Coombes. He went over your history, family history and how you've been feeling lately.  He will see you back in 6 months for labs and follow up.   Thank you for choosing [place] at [facility] to provide your oncology and hematology care.  To afford each patient quality time with our provider, please arrive at least 15 minutes before your scheduled appointment time.   If you have a lab appointment with the [facility] please come in thru the  Main Entrance and check in at the main information desk  You need to re-schedule your appointment should you arrive 10 or more minutes late.  We strive to give you quality time with our providers, and arriving late affects you and other patients whose appointments are after yours.  Also, if you no show three or more times for appointments you may be dismissed from the clinic at the providers discretion.     Again, thank you for choosing [facility].  Our hope is that these requests will decrease the amount of time that you wait before being seen by our physicians.       _____________________________________________________________  Should you have questions after your visit to [facility], please contact our office at [phone] between the hours of 8:00 a.m. and 4:30 p.m.  Voicemails left after 4:00 p.m. will not be returned until the following business day.  For prescription refill requests, have your pharmacy contact our office and allow 72 hours.    Cancer Center Support Programs:   > Cancer Support Group  2nd Tuesday of the month 1pm-2pm, Journey Room

## 2018-09-09 ENCOUNTER — Other Ambulatory Visit: Payer: Self-pay | Admitting: *Deleted

## 2018-09-09 NOTE — Patient Outreach (Addendum)
[facility]) Care Management  09/09/2018  Clelia Trabucco 20-Sep-1970 [id number]  Care Coordination  Received email from [place] stating she returned to work today as there was a miscommunication regarding the time of her appointment yesterday at [facility]. She says she had planned on asking for an extension to her medical leave. She said she will try to arrange to pick up the Glucerna samples if she can get permission from her manager. This RNCM offered to deliver the samples to her work place if it is in [place].  Await response from [place]  Barrington Ellison RN,CCM,CDE [place] Management Coordinator Office Phone [phone] Office Fax [phone]

## 2018-09-10 ENCOUNTER — Encounter: Payer: 59 | Admitting: Emergency Medicine

## 2018-09-10 ENCOUNTER — Encounter: Payer: Self-pay | Admitting: Family Medicine

## 2018-09-10 ENCOUNTER — Encounter: Payer: 59 | Admitting: Registered Nurse

## 2018-09-12 ENCOUNTER — Ambulatory Visit: Payer: 59 | Admitting: "Endocrinology

## 2018-09-17 ENCOUNTER — Ambulatory Visit (INDEPENDENT_AMBULATORY_CARE_PROVIDER_SITE_OTHER): Payer: 59 | Admitting: Family Medicine

## 2018-09-17 ENCOUNTER — Encounter: Payer: Self-pay | Admitting: Family Medicine

## 2018-09-17 ENCOUNTER — Other Ambulatory Visit: Payer: Self-pay

## 2018-09-17 VITALS — BP 131/95 | HR 95 | Temp 98.6°F | Ht 65.0 in | Wt 142.8 lb

## 2018-09-17 DIAGNOSIS — Z902 Acquired absence of lung [part of]: Secondary | ICD-10-CM | POA: Diagnosis not present

## 2018-09-17 DIAGNOSIS — I10 Essential (primary) hypertension: Secondary | ICD-10-CM | POA: Diagnosis not present

## 2018-09-17 DIAGNOSIS — C3411 Malignant neoplasm of upper lobe, right bronchus or lung: Secondary | ICD-10-CM

## 2018-09-17 DIAGNOSIS — F5102 Adjustment insomnia: Secondary | ICD-10-CM | POA: Diagnosis not present

## 2018-09-17 MED ORDER — TRAZODONE HCL 50 MG PO TABS: 50.0000 mg | ORAL_TABLET | Freq: Every day | ORAL | 3 refills | Status: DC

## 2018-09-17 MED FILL — traZODone HCL 50 MG TABS: 50 | 30 days supply | Qty: 30 | Fill #0

## 2018-09-17 NOTE — Progress Notes (Signed)
New Patient Office Visit  Subjective:  Patient ID: Crystal Bryant, female    DOB: [birth year]  Age: 48 y.o. MRN: [id number]  CC:  Chief Complaint  Patient presents with  . New Patient (Initial Visit)  . Fatigue    f/u    HPI [facility] presents for f/u [place] admission with DM, CHF, h/o lung CA with re-section. Pt with appts with endo, cardio and oncology-reviewed visits with specialist and recommendations with pt  Pt reports Glucose readings-169-200-pt using glucerna for nutrition-will pick up from [place]  Pt states in the last 4 days she has gained  11 lbs, extra demadex taken Sunday and Tuesday with 7 lb loss today.pt is weighing daily  Insomnia-used Xanax in the past  -daughter left for college and pt concerned to use Benzos due to addiction potential  Oncology assessment: Malignant neoplasm of upper lobe of right lung ([place]) 1.  T2a N0 adenocarcinoma of the right lung: - Chest x-ray on 09/08/2015 for 1 month history of dyspnea and cough showed right upper lobe lung nodule.  CT chest on 09/14/2015 at [facility] showed 2.8 x 1.9 cm mass in the anterior right upper lobe extending to the pleura. - On 10/06/2015, she underwent bronchoscopy, mediastinoscopy, right thoracoscopic upper lobectomy and mediastinal lymph node dissection. -Pathology showed 2.1 cm moderately differentiated adenocarcinoma, PT2AN0, negative lymph nodes at 4R, 7 and 11 stations. - Last CT scan and follow-up visit with [facility] CT surgery was on 04/14/2016. - She denies any recent cough or hemoptysis.  No chest pains were reported. - She is a never smoker.  Physical exam today was within normal limits. -She was reportedly diagnosed with COVID-19 and was in the hospital for 7 days in June. - We reviewed results of CT scan chest with contrast dated 09/01/2018 which showed no evidence of local tumor recurrence.  No evidence of metastatic disease in the chest or upper abdomen. - onc discussed surveillance plan  with her in detail.  She will be seen back in 6 months for follow-up with repeat CT scan.   Endo: -  uncontrolled symptomatic type 2 DM since  48 years of age,  with most recent A1c of 14 %. Recent labs reviewed. - I had a long discussion with her about the progressive nature of diabetes and the pathology behind its complications. -her diabetes is complicated by congestive heart failure, sedentary life and she remains at a high risk for more acute and chronic complications which include CAD, CVA, CKD, retinopathy, and neuropathy. These are all discussed in detail with her.  - I have counseled her on diet management by adopting a carbohydrate restricted/protein rich diet. - she admits that there is a room for improvement in her food and drink choices. - Suggestion is made for her to avoid simple carbohydrates  from her diet including Cakes, Sweet Desserts, Ice Cream, Soda (diet and regular), Sweet Tea, Candies, Chips, Cookies, Store Bought Juices, Alcohol in Excess of  1-2 drinks a day, Artificial Sweeteners,  Coffee Creamer, and "Sugar-free" Products. This will help patient to have more stable blood glucose profile and potentially avoid unintended weight gain.  - I encouraged her to switch to  unprocessed or minimally processed complex starch and increased protein intake (animal or plant source), fruits, and vegetables.  - she is advised to stick to a routine mealtimes to eat 3 meals  a day and avoid unnecessary snacks ( to snack only to correct hypoglycemia).   - she will be scheduled  with Jearld Fenton, RDN, CDE for diabetes education.  - I have approached her with the following individualized plan to manage  her diabetes and patient agrees:   -Patient will likely require insulin treatment in order for her to achieve control of diabetes to target. -However, she is hesitant and overwhelmed this morning, willing to start monitoring blood glucose. -I implored her to start monitoring  blood glucose 4 times a day and return in 10 days with her meter and logs for evaluation. -In the meantime, she will be taken off of Jardiance due to high risk body habitus. -She is advised to continue Janumet 50/1000 mg p.o. twice daily. - Adjustment parameters are given to her for hypo and hyperglycemia in writing. - she is encouraged to call clinic for blood glucose levels less than 70 or above 300 mg /dl. Return in about 10 days (around 09/13/2018) for Follow up with Meter and Logs Only - no Labs  - Patient specific target  A1c;  LDL, HDL, Triglycerides, and  Waist Circumference were discussed in detail. Pt is followed by a RN/Diabetic educator   Cardio  PLAN:     Acute on chronic systolic diastolic congestive heart failure:  Continue the seems to be doing better.  She is diuresed nicely on the torsemide.  Her weight is down 5 pounds from last week.  She is breathing much better at night.  She still has some dyspnea on exertion.  We will start her on Jardiance 10 mg a day.  We will also add spironolactone 12.5 mg a day.  We will reduce her potassium chloride to 10 mEq a day.  We will check a basic metabolic profile today and again in 2 weeks.  I will see her again in approximately 1 month for continued titration upward on her medications.  I discussed the case with Dr. Lovena Le.  He agrees that she has a high likelihood of needing a biventricular ICD.  2.  Left bundle branch block:  :  Continue medical therapy.   Likely would benefit from a BI - V ICD   Ref Range & Units 4wk ago 64mo ago  Sodium 135 - 145 mmol/L 136  136   Potassium 3.5 - 5.1 mmol/L 3.8  4.4 CM   Chloride 98 - 111 mmol/L 103  98   CO2 22 - 32 mmol/L 24  24   Glucose, Bld 70 - 99 mg/dL 200High   218High    BUN 6 - 20 mg/dL 14  21High    Creatinine, Ser 0.44 - 1.00 mg/dL 0.67  0.65   Calcium 8.9 - 10.3 mg/dL 9.1  8.8Low    GFR calc non Af Amer >60 mL/min >60  >60   GFR calc Af Amer >60 mL/min >60  >60   Anion  gap 5 - 15 9  14  CM   Comment: Performed at [facility], [address] [place], [place] [address]                     Ref Range & Units 70mo ago (07/15/18) 76mo ago (07/14/18)  Sodium 135 - 145 mmol/L 136  137   Potassium 3.5 - 5.1 mmol/L 4.4  3.4Low  CM   Comment: DELTA CHECK NOTED  REPEATED TO VERIFY  NO VISIBLE HEMOLYSIS   Chloride 98 - 111 mmol/L 98  102   CO2 22 - 32 mmol/L 24  26   Glucose, Bld 70 - 99 mg/dL 218High   129High  BUN 6 - 20 mg/dL 21High   22High    Creatinine, Ser 0.44 - 1.00 mg/dL 0.65  0.60   Calcium 8.9 - 10.3 mg/dL 8.8Low   8.3Low    Total Protein 6.5 - 8.1 g/dL 6.9  6.0Low    Albumin 3.5 - 5.0 g/dL 3.0Low   2.6Low    AST 15 - 41 U/L 14Low   16   ALT 0 - 44 U/L 22  23   Alkaline Phosphatase 38 - 126 U/L 94  87   Total Bilirubin 0.3 - 1.2 mg/dL 0.7  0.6   GFR calc non Af Amer >60 mL/min >60  >60   GFR calc Af Amer >60 mL/min >60  >60   Anion gap 5 - 15 14  9  CM   Comment: Performed at [facility], [place] [address], [place],  [address]     Past Medical History:  Diagnosis Date  . Anxiety   . CHF (congestive heart failure) ([place])   . Diabetes mellitus without complication ([place])   . Hypertension   . Lung cancer, upper lobe ([place]) 09/2015   Non-small cell in right upper lobe treated with RULectomy by VATS at [facility]    Past Surgical History:  Procedure Laterality Date  . ABDOMINAL HYSTERECTOMY    . BREAST BIOPSY Right 02/10/2018   Biopsy of upper outer quadrant of right breast due to calcifications seen on mammography revealed fat necrosis with calcifications.  Benign so continue annual screening mammograms.  Marland Kitchen BREAST SURGERY    . CESAREAN SECTION    . LOBECTOMY Right 10/06/2015   [place] lung cancer treated with VATS right upper lobectomy by Dr. [name] at [facility]    Family History  Problem Relation Age of Onset  . Diabetes Mother   . Hypertension Mother   . Stroke Father   . Breast cancer Paternal Aunt   . Breast  cancer Paternal Aunt   . Breast cancer Paternal Aunt   . Breast cancer Paternal Aunt     Social History  daughter 70 yo at [employer] state, son 58yo lives with pt -working in the area, granddaughter 2yo lives with pt Socioeconomic History  . Marital status: Divorced    Spouse name: Not on file  . Number of children: 2  . Years of education: Not on file  . Highest education level: Not on file  Occupational History  . Occupation: Patient Engineer, manufacturing systems: [employer]  . Financial resource strain: Not hard at all  . Food insecurity    Worry: Never true    Inability: Never true  . Transportation needs    Medical: No    Non-medical: No  Tobacco Use  . Smoking status: Never Smoker  . Smokeless tobacco: Never Used  Substance and Sexual Activity  . Alcohol use: No  . Drug use: No  . Sexual activity: Yes    Birth control/protection: Surgical  Lifestyle  . Physical activity    Days per week: 0 days    Minutes per session: 0 min  . Stress: Rather much  Relationships  . Social Herbalist on phone: Three times a week    Gets together: Three times a week    Attends religious service: More than 4 times per year    Active member of club or organization: No    Attends meetings of clubs or organizations: Never    Relationship status: Divorced  . Intimate partner  violence    Fear of current or ex partner: No    Emotionally abused: No    Physically abused: No    Forced sexual activity: No  Other Topics Concern  . Not on file  Social History Narrative  . Not on file    ROS Review of Systems  Constitutional: Positive for fatigue. Negative for appetite change.  HENT: Negative for congestion and sinus pain.   Respiratory: Positive for cough and shortness of breath.   Cardiovascular: Negative for chest pain and palpitations.  Gastrointestinal: Negative for diarrhea.  Genitourinary: Negative for dysuria.  Musculoskeletal: Negative for arthralgias.   Neurological: Negative for headaches.  Psychiatric/Behavioral: Positive for sleep disturbance.    Objective:   Today's Vitals: BP (!) 131/95 (BP Location: Left Arm, Patient Position: Sitting, Cuff Size: Normal)   Pulse 95   Temp 98.6 F (37 C) (Oral)   Ht 5\' 5"  (1.651 m)   Wt 142 lb 12.8 oz (64.8 kg)   SpO2 95%   BMI 23.76 kg/m   Physical Exam Constitutional:      Appearance: Normal appearance.  HENT:     Head: Normocephalic and atraumatic.  Eyes:     Conjunctiva/sclera: Conjunctivae normal.  Neck:     Musculoskeletal: Normal range of motion and neck supple.  Cardiovascular:     Rate and Rhythm: Normal rate and regular rhythm.     Pulses: Normal pulses.     Heart sounds: Normal heart sounds.  Pulmonary:     Effort: Pulmonary effort is normal.     Breath sounds: Normal breath sounds.  Neurological:     Mental Status: She is alert and oriented to person, place, and time.  Psychiatric:        Mood and Affect: Mood normal.     Assessment & Plan:   Outpatient Encounter Medications as of 09/17/2018  Medication Sig  . acetaminophen (TYLENOL) 500 MG tablet Take 1,000 mg by mouth every 6 (six) hours as needed for mild pain or moderate pain.  Marland Kitchen ALPRAZolam (XANAX) 0.5 MG tablet Take 1 tablet (0.5 mg total) by mouth at bedtime as needed for anxiety.  . Blood Glucose Monitoring Suppl (FREESTYLE LITE) DEVI Use As Directed  . carvedilol (COREG) 25 MG tablet Take 1.5 tablets (37.5 mg total) by mouth 2 (two) times daily.  Marland Kitchen glucose blood (FREESTYLE LITE) test strip Use as instructed  . Lancets (FREESTYLE) lancets Use as instructed  . potassium chloride (K-DUR) 10 MEQ tablet Take 1 tablet (10 mEq total) by mouth daily.  . rosuvastatin (CRESTOR) 20 MG tablet Take 1 tablet (20 mg total) by mouth daily.  . sacubitril-valsartan (ENTRESTO) 97-103 MG Take 1 tablet by mouth 2 (two) times daily.  . sitaGLIPtin-metformin (JANUMET) 50-1000 MG tablet Take 1 tablet 2 (two) times daily with a  meal by mouth.  . spironolactone (ALDACTONE) 25 MG tablet Take 0.5 tablets (12.5 mg total) by mouth daily.  Marland Kitchen torsemide (DEMADEX) 20 MG tablet Take 1 tablet (20 mg total) by mouth daily.   No facility-administered encounter medications on file as of 09/17/2018.    1. Essential hypertension, benign carvedilol 37.5 mg p.o. twice daily, spironolactone 25 mg p.o. daily, demadex 20mg -2 extra doses w/i the last 4 days due to rapid weight gain, SOB  , cmp prior to seeing cardiology-increase use of demadex and decrease in potassium. Entresto daily  2. Status post lobectomy of lung CT reviewed with pt, reviewed with oncology  3. Malignant neoplasm of upper lobe of right  lung ([place]) On reoccurrence per oncology Lipids/Hyperlipidemia:    Crestor 20 mg p.o. nightly. Liver function prior to f/u at cardiology-crestor started, will repeat lipids in 6 weeks.  insomnia-started trazodone-concern for anxiety at night-previously using xanax-d/w pt will discontinue. Follow up to determine if working well for pt-risk/benefit/side effects d/w pt.   Pt to keep appts with cardio and will make f/u appt with endo for evaluation and start of insulin as per discussion today Clela Hagadorn Hannah Beat, MD

## 2018-09-17 NOTE — Patient Instructions (Addendum)
Schedule appt with Endo-bring logs Print order for CMP for [facility] Print order for Lipid panel in 6 weeks -fasting Oct appt

## 2018-09-18 ENCOUNTER — Encounter: Payer: 59 | Admitting: Emergency Medicine

## 2018-09-22 ENCOUNTER — Encounter: Payer: Self-pay | Admitting: Cardiovascular Disease

## 2018-09-22 NOTE — Progress Notes (Signed)
Cardiology Office Note:    Date:  09/26/2018   ID:  Crystal Bryant, DOB 04-27-1970, MRN [id number]  PCP:  Crystal Hancock, MD  Cardiologist:  Crystal Moores, MD   Referring MD: Crystal Knapp, MD   Chief Complaint  Patient presents with  . Congestive Heart Failure      Initial Visit   May 03, 2017    Crystal Bryant is a 48 y.o. female with a hx of non small cell lung cancer, HTN, LBBB   Hx of lung cancer last year in 2018 , status post resection.  She did not require chemotherapy. She has a history of high blood pressure.    Had HELLP syndrome with birth of son  .  She required dialysis for a short time following the birth of her son.  For the past 24 years since the birth of her son.  Hx of DM     Generally she has felt well,  She has had shortness of breath doing her normal activities.    intermittant shortness of breath.   Episodes may last for 10-15 minutes.  No chest tightness or pain   Exercises - walks the treadmill on occasion but does not push the pace  + PND and orthopnea since her lung surgery   Tries to limit her salt intake .  Not much fast food or take out .   Had an echo at [facility] ~ 2017 ( results not found )   Non smoker  No ETOH Family HTN - on both sides of family   August 12, 2018 :  Crystal Bryant is seen today for CHF follow up Had [place]vid 19 in July 07, 2018  was at [facility] for a week pain on your ejection fraction [place] Having lots of dyspnea , especially at night .   August 19, 2018  Crystal Bryant is seen for follow up of her CHF I saw her last week.  We increased the Entresto to 97-103 mg twice a day.  We started her on torsemide instead of Lasix. Wt. Today is 139.8 lbs,   Down 5 lbs from last week No orthopnea or pnd now. DOE is better .   Crystal Bryant, CHN nurse contacted me and asked [place] to start her   on Jardiance.  Aug. 25, 2020 :  Crystal Bryant is seen today for fo  Wt gain was sudden.  Thinks is volume overloaded. HR is still fast   Endocrinollogy (? Crystal Bryant)   took her off the Jardiance  She is still having some DOE.   Overall not significantly improved.   Past Medical History:  Diagnosis Date  . Anxiety   . CHF (congestive heart failure) ([place])   . Diabetes mellitus without complication ([place])   . Hypertension   . Lung cancer, upper lobe ([place]) 09/2015   Non-small cell in right upper lobe treated with RULectomy by VATS at [facility]    Past Surgical History:  Procedure Laterality Date  . ABDOMINAL HYSTERECTOMY    . BREAST BIOPSY Right 02/10/2018   Biopsy of upper outer quadrant of right breast due to calcifications seen on mammography revealed fat necrosis with calcifications.  Benign so continue annual screening mammograms.  Marland Kitchen BREAST SURGERY    . CESAREAN SECTION    . LOBECTOMY Right 10/06/2015   [place] lung cancer treated with VATS right upper lobectomy by Crystal Bryant at [facility].    Current Medications: Current Meds  Medication Sig  . acetaminophen (TYLENOL) 500  MG tablet Take 1,000 mg by mouth every 6 (six) hours as needed for mild pain or moderate pain.  . Blood Glucose Monitoring Suppl (FREESTYLE LITE) DEVI Use As Directed  . carvedilol (COREG) 25 MG tablet Take 1.5 tablets (37.5 mg total) by mouth 2 (two) times daily.  Marland Kitchen glucose blood (FREESTYLE LITE) test strip Use as instructed  . Lancets (FREESTYLE) lancets Use as instructed  . potassium chloride (K-DUR) 10 MEQ tablet Take 1 tablet (10 mEq total) by mouth daily.  . rosuvastatin (CRESTOR) 20 MG tablet Take 1 tablet (20 mg total) by mouth daily.  . sacubitril-valsartan (ENTRESTO) 97-103 MG Take 1 tablet by mouth 2 (two) times daily.  . sitaGLIPtin-metformin (JANUMET) 50-1000 MG tablet Take 1 tablet 2 (two) times daily with a meal by mouth.  . spironolactone (ALDACTONE) 25 MG tablet Take 0.5 tablets (12.5 mg total) by mouth daily.  Marland Kitchen torsemide (DEMADEX) 20 MG tablet Take 1 tablet (20 mg total) by mouth daily.  . traZODone (DESYREL) 50 MG tablet Take 1 tablet (50  mg total) by mouth at bedtime.     Allergies:   Patient has no known allergies.   Social History   Socioeconomic History  . Marital status: Divorced    Spouse name: Not on file  . Number of children: 2  . Years of education: Not on file  . Highest education level: Not on file  Occupational History  . Occupation: Patient Engineer, manufacturing systems: [employer]  . Financial resource strain: Not hard at all  . Food insecurity    Worry: Never true    Inability: Never true  . Transportation needs    Medical: No    Non-medical: No  Tobacco Use  . Smoking status: Never Smoker  . Smokeless tobacco: Never Used  Substance and Sexual Activity  . Alcohol use: No  . Drug use: No  . Sexual activity: Yes    Birth control/protection: Surgical  Lifestyle  . Physical activity    Days per week: 0 days    Minutes per session: 0 min  . Stress: Rather much  Relationships  . Social Herbalist on phone: Three times a week    Gets together: Three times a week    Attends religious service: More than 4 times per year    Active member of club or organization: No    Attends meetings of clubs or organizations: Never    Relationship status: Divorced  Other Topics Concern  . Not on file  Social History Narrative  . Not on file     Family History: The patient's family history includes Breast cancer in her paternal aunt, paternal aunt, paternal aunt, and paternal aunt; Diabetes in her mother; Hypertension in her mother; Stroke in her father.  ROS:   Please see the history of present illness.     All other systems reviewed and are negative.  EKGs/Labs/Other Studies Reviewed:    The following studies were reviewed today:   EKG:   May 03, 2017: Sinus tachycardia at 110 beats a minute.  Left bundle branch block.  Recent Labs: 07/09/2018: B Natriuretic Peptide 118.0 07/10/2018: Magnesium 2.4 07/15/2018: ALT 22 07/24/2018: Hemoglobin 11.6; Platelets 360  08/20/2018: BUN 14; Creatinine, Ser 0.67; Potassium 3.8; Sodium 136  Recent Lipid Panel    Component Value Date/Time   CHOL 278 (H) 12/15/2016 1201   TRIG 171 (H) 07/09/2018 1015   HDL 45 12/15/2016 1201  CHOLHDL 6.2 (H) 12/15/2016 1201   LDLCALC 197 (H) 12/15/2016 1201    Physical Exam:      Physical Exam: Blood pressure 122/86, pulse 95, height 5\' 5"  (1.651 m), weight 145 lb (65.8 kg), SpO2 99 %.  GEN:  Well nourished, well developed in no acute distress HEENT: Normal NECK: No JVD; No carotid bruits LYMPHATICS: No lymphadenopathy CARDIAC: RRR, + S3 gallop  RESPIRATORY:  Clear to auscultation without rales, wheezing or rhonchi  ABDOMEN: Soft, non-tender, non-distended MUSCULOSKELETAL:  No edema; No deformity  SKIN: Warm and dry NEUROLOGIC:  Alert and oriented x 3   ASSESSMENT:    No diagnosis found. PLAN:    In order of problems listed above:  Acute on chronic systolic diastolic congestive heart failure:  Continue the seems to be doing better.  She is diuresed nicely on the torsemide.  Her weight is down 5 pounds from last week.  She is breathing much better at night.  She still has some dyspnea on exertion.  The Jardiance was stopped by Endocrinology.   Will reach out and ask if there was an issue with labs .   In addition to being a diabetic medication, Vania Rea has definite cardiovascular benefit ( survival benefit in diabetic patients with CHF)  We restart  Jardiance 10 mg a day.  incrase  spironolactone 25 mg a day.   Double torsemide and Kdur for the next 3 days  Add Corlanor 5 PO BID for better HR control   We will check a basic metabolic profile today and again in 2 weeks.  I will see her again in approximately 1 month for continued titration upward on her medications.  Refer to [facility] for Bi-V ICD   2.  Left bundle branch block:  :  Continue medical therapy.   Likely would benefit from a BI - V ICD   3.  Essential hypertension:   BP is well controoo  3.   Diabetes mellitus:     4.  Non-small cell lung cancer: She is status post surgical resection.  We will follow-up with the doctors at [facility].   Medication Adjustments/Labs and Tests Ordered: Current medicines are reviewed at length with the patient today.  Concerns regarding medicines are outlined above.  No orders of the defined types were placed in this encounter.  No orders of the defined types were placed in this encounter.    Signed, Crystal Moores, MD  09/26/2018 8:16 AM    [place]

## 2018-09-23 MED FILL — TORSEMIDE 20 MG TABLET: 20 | 30 days supply | Qty: 30 | Fill #1

## 2018-09-26 ENCOUNTER — Other Ambulatory Visit: Payer: Self-pay | Admitting: *Deleted

## 2018-09-26 ENCOUNTER — Ambulatory Visit (INDEPENDENT_AMBULATORY_CARE_PROVIDER_SITE_OTHER): Payer: 59 | Admitting: Cardiovascular Disease

## 2018-09-26 ENCOUNTER — Other Ambulatory Visit: Payer: Self-pay

## 2018-09-26 VITALS — BP 122/86 | HR 95 | Ht 65.0 in | Wt 145.0 lb

## 2018-09-26 DIAGNOSIS — Z79899 Other long term (current) drug therapy: Secondary | ICD-10-CM | POA: Diagnosis not present

## 2018-09-26 DIAGNOSIS — I5023 Acute on chronic systolic (congestive) heart failure: Secondary | ICD-10-CM

## 2018-09-26 DIAGNOSIS — I447 Left bundle-branch block, unspecified: Secondary | ICD-10-CM | POA: Diagnosis not present

## 2018-09-26 MED ORDER — IVABRADINE HCL 5 MG PO TABS: 5.0000 mg | ORAL_TABLET | Freq: Two times a day (BID) | ORAL | Status: DC

## 2018-09-26 MED ORDER — EMPAGLIFLOZIN 10 MG PO TABS: 10.0000 mg | ORAL_TABLET | Freq: Every day | ORAL | 11 refills | Status: DC

## 2018-09-26 MED ORDER — SPIRONOLACTONE 25 MG PO TABS: 25.0000 mg | ORAL_TABLET | Freq: Every day | ORAL | 3 refills | Status: DC

## 2018-09-26 NOTE — Patient Outreach (Addendum)
[facility] Care Management  09/26/2018  Crystal Bryant 12/11/1970 [id number]   Care Coordination Met with Crystal Bryant in the parking lot of the [facility] Care Management office to provide her with 6 bottles of Vanilla Glucerna Hunger Smart Shakes and coupons for the supplement..  Subjective. Crystal Bryant states she has just completed a visit with Dr. Nahser and he increase her torsemide and KCl dosage for the next 3 days due to recent weight gai. He also increased the spironolactone and added Corlanor to her treatment regimen for heart failure and rate control. . He restarted the Jardiance 10 mg daily. She will be referred to Dr Taylor for consideration of biventricular ICD.   Plan: Emailed Laurell the [employer] educational material on Corlanor.  Case was closed to [facility] Care Management services on 6/26/ when Crystal Bryant was enrolled in 's  [employer] digital assistant platform. Encouraged Crystal Bryant to contact his RNCM for any care management needs.  Janet S. Hauser RN,CCM,CDE [facility] Care Management Coordinator Office Phone [phone] Office Fax [phone]    

## 2018-09-26 NOTE — Patient Instructions (Signed)
Your physician has recommended you make the following change in your medication:  INCREASE SPIRONOLACTONE TO 25 MG EVERY DAY  START JARDIANCE 10 MG EVERY DAY START CORLANOR 5 MG TWICE DAILY Your physician recommends that you return for lab work in:  [place] 2 WEEKS  You have been referred to EP DR [place] physician recommends that you schedule a follow-up appointment in: [place] Acie Fredrickson

## 2018-10-03 ENCOUNTER — Other Ambulatory Visit (HOSPITAL_COMMUNITY)
Admission: RE | Admit: 2018-10-03 | Discharge: 2018-10-03 | Disposition: A | Discharge status: Home / Self Care | Payer: 59 | Source: Ambulatory Visit | Attending: Family Medicine | Admitting: Family Medicine

## 2018-10-03 DIAGNOSIS — I10 Essential (primary) hypertension: Secondary | ICD-10-CM | POA: Diagnosis not present

## 2018-10-03 LAB — COMPREHENSIVE METABOLIC PANEL
ALT: 15 U/L (ref 0–44)
AST: 17 U/L (ref 15–41)
Albumin: 3.8 g/dL (ref 3.5–5.0)
Alkaline Phosphatase: 93 U/L (ref 38–126)
Anion gap: 10 (ref 5–15)
BUN: 12 mg/dL (ref 6–20)
CO2: 22 mmol/L (ref 22–32)
Calcium: 9 mg/dL (ref 8.9–10.3)
Chloride: 104 mmol/L (ref 98–111)
Creatinine, Ser: 0.76 mg/dL (ref 0.44–1.00)
GFR calc Af Amer: >60 mL/min (ref >60)
GFR calc non Af Amer: >60 mL/min (ref >60)
Glucose, Bld: 270 mg/dL — ABNORMAL HIGH (ref 70–99)
Potassium: 3.8 mmol/L (ref 3.5–5.1)
Sodium: 136 mmol/L (ref 135–145)
Total Bilirubin: 0.3 mg/dL (ref 0.3–1.2)
Total Protein: 7.4 g/dL (ref 6.5–8.1)

## 2018-10-14 MED FILL — TORSEMIDE 20 MG TABLET: 20 | 30 days supply | Qty: 30 | Fill #1

## 2018-10-16 ENCOUNTER — Encounter: Payer: Self-pay | Admitting: Family Medicine

## 2018-10-20 ENCOUNTER — Ambulatory Visit (HOSPITAL_COMMUNITY): Payer: 59 | Admitting: Hematology

## 2018-10-23 ENCOUNTER — Telehealth (INDEPENDENT_AMBULATORY_CARE_PROVIDER_SITE_OTHER): Payer: Self-pay

## 2018-10-23 NOTE — Telephone Encounter (Signed)
Faxed form back to [employer] with note from Dr Holly Bodily "I will not complete FMLA for this patient"

## 2018-10-24 ENCOUNTER — Ambulatory Visit (INDEPENDENT_AMBULATORY_CARE_PROVIDER_SITE_OTHER): Payer: 59 | Admitting: Internal Medicine

## 2018-10-24 ENCOUNTER — Other Ambulatory Visit: Payer: Self-pay

## 2018-10-24 ENCOUNTER — Encounter: Payer: Self-pay | Admitting: Internal Medicine

## 2018-10-24 VITALS — BP 150/94 | HR 94 | Ht 65.0 in | Wt 148.0 lb

## 2018-10-24 DIAGNOSIS — Z23 Encounter for immunization: Secondary | ICD-10-CM | POA: Diagnosis not present

## 2018-10-24 DIAGNOSIS — I447 Left bundle-branch block, unspecified: Secondary | ICD-10-CM | POA: Diagnosis not present

## 2018-10-24 DIAGNOSIS — I5023 Acute on chronic systolic (congestive) heart failure: Secondary | ICD-10-CM

## 2018-10-24 NOTE — Patient Instructions (Addendum)
Medication Instructions:  Your physician recommends that you continue on your current medications as directed. Please refer to the Current Medication list given to you today.  Labwork: None ordered.  Testing/Procedures: None ordered.  Follow-Up:  Dates available for procedure:  October 5, 9, 21, 23, 26, 27 November 2, 5, 9, 16, 20, 23   Any Other Special Instructions Will Be Listed Below (If Applicable).  If you need a refill on your cardiac medications before your next appointment, please call your pharmacy.    Cardioverter Defibrillator Implantation  An implantable cardioverter defibrillator (ICD) is a small device that is placed under the skin in the chest or abdomen. An ICD consists of a battery, a small computer (pulse generator), and wires (leads) that go into the heart. An ICD is used to detect and correct two types of dangerous irregular heartbeats (arrhythmias):  A rapid heart rhythm (tachycardia).  An arrhythmia in which the lower chambers of the heart (ventricles) contract in an uncoordinated way (fibrillation). When an ICD detects tachycardia, it sends a low-energy shock to the heart to restore the heartbeat to normal (cardioversion). This signal is usually painless. If cardioversion does not work or if the ICD detects fibrillation, it delivers a high-energy shock to the heart (defibrillation) to restart the heart. This shock may feel like a strong jolt in the chest. Your health care provider may prescribe an ICD if:  You have had an arrhythmia that originated in the ventricles.  Your heart has been damaged by a disease or heart condition. Sometimes, ICDs are programmed to act as a device called a pacemaker. Pacemakers can be used to treat a slow heartbeat (bradycardia) or tachycardia by taking over the heart rate with electrical impulses. Tell a health care provider about:  Any allergies you have.  All medicines you are taking, including vitamins, herbs, eye  drops, creams, and over-the-counter medicines.  Any problems you or family members have had with anesthetic medicines.  Any blood disorders you have.  Any surgeries you have had.  Any medical conditions you have.  Whether you are pregnant or may be pregnant. What are the risks? Generally, this is a safe procedure. However, problems may occur, including:  Swelling, bleeding, or bruising.  Infection.  Blood clots.  Damage to other structures or organs, such as nerves, blood vessels, or the heart.  Allergic reactions to medicines used during the procedure. What happens before the procedure? Staying hydrated Follow instructions from your health care provider about hydration, which may include:  Up to 2 hours before the procedure - you may continue to drink clear liquids, such as water, clear fruit juice, black coffee, and plain tea. Eating and drinking restrictions Follow instructions from your health care provider about eating and drinking, which may include:  8 hours before the procedure - stop eating heavy meals or foods such as meat, fried foods, or fatty foods.  6 hours before the procedure - stop eating light meals or foods, such as toast or cereal.  6 hours before the procedure - stop drinking milk or drinks that contain milk.  2 hours before the procedure - stop drinking clear liquids. Medicine Ask your health care provider about:  Changing or stopping your normal medicines. This is important if you take diabetes medicines or blood thinners.  Taking medicines such as aspirin and ibuprofen. These medicines can thin your blood. Do not take these medicines before your procedure if your doctor tells you not to. Tests  You may have blood  tests.  You may have a test to check the electrical signals in your heart (electrocardiogram, ECG).  You may have imaging tests, such as a chest X-ray. General instructions  For 24 hours before the procedure, stop using products  that contain nicotine or tobacco, such as cigarettes and e-cigarettes. If you need help quitting, ask your health care provider.  Plan to have someone take you home from the hospital or clinic.  You may be asked to shower with a germ-killing soap. What happens during the procedure?  To reduce your risk of infection: ? Your health care team will wash or sanitize their hands. ? Your skin will be washed with soap. ? Hair may be removed from the surgical area.  Small monitors will be put on your body. They will be used to check your heart, blood pressure, and oxygen level.  An IV tube will be inserted into one of your veins.  You will be given one or more of the following: ? A medicine to help you relax (sedative). ? A medicine to numb the area (local anesthetic). ? A medicine to make you fall asleep (general anesthetic).  Leads will be guided through a blood vessel into your heart and attached to your heart muscles. Depending on the ICD, the leads may go into one ventricle or they may go into both ventricles and into an upper chamber of the heart. An X-ray machine (fluoroscope) will be usedto help guide the leads.  A small incision will be made to create a deep pocket under your skin.  The pulse generator will be placed into the pocket.  The ICD will be tested.  The incision will be closed with stitches (sutures), skin glue, or staples.  A bandage (dressing) will be placed over the incision. This procedure may vary among health care providers and hospitals. What happens after the procedure?  Your blood pressure, heart rate, breathing rate, and blood oxygen level will be monitored often until the medicines you were given have worn off.  A chest X-ray will be taken to check that the ICD is in the right place.  You will need to stay in the hospital for 1-2 days so your health care provider can make sure your ICD is working.  Do not drive for 24 hours if you received a sedative. Ask  your health care provider when it is safe for you to drive.  You may be given an identification card explaining that you have an ICD. Summary  An implantable cardioverter defibrillator (ICD) is a small device that is placed under the skin in the chest or abdomen. It is used to detect and correct dangerous irregular heartbeats (arrhythmias).  An ICD consists of a battery, a small computer (pulse generator), and wires (leads) that go into the heart.  When an ICD detects rapid heart rhythm (tachycardia), it sends a low-energy shock to the heart to restore the heartbeat to normal (cardioversion). If cardioversion does not work or if the ICD detects uncoordinated heart contractions (fibrillation), it delivers a high-energy shock to the heart (defibrillation) to restart the heart.  You will need to stay in the hospital for 1-2 days to make sure your ICD is working. This information is not intended to replace advice given to you by your health care provider. Make sure you discuss any questions you have with your health care provider. Document Released: 10/07/2001 Document Revised: 12/28/2016 Document Reviewed: 01/25/2016 Elsevier Patient Education  2020 [employer].

## 2018-10-24 NOTE — Progress Notes (Signed)
HPI Crystal Bryant is referred today by Dr. Acie Fredrickson for evaluation of chronic systolic heart failure in the setting of LBBB to consider ICD insertion. She has a h/o non-small cell lung CA, s/p excision with no need for chemotherapy. She has a h/o HTN and was found to have congestive heart failure about 3 years ago. She initially was non-compliant but has been on maximal medical therapy. She has had a repeat echo which demonstrated an EF of 30% and clear cut dysnergy between the septal and lateral walls. She has LBBB and a QRS duration of 140. She has class 3 symptoms.  No Known Allergies   Current Outpatient Medications  Medication Sig Dispense Refill   acetaminophen (TYLENOL) 500 MG tablet Take 1,000 mg by mouth every 6 (six) hours as needed for mild pain or moderate pain.     Blood Glucose Monitoring Suppl (FREESTYLE LITE) DEVI Use As Directed 1 Device 0   carvedilol (COREG) 25 MG tablet Take 1.5 tablets (37.5 mg total) by mouth 2 (two) times daily. 270 tablet 3   empagliflozin (JARDIANCE) 10 MG TABS tablet Take 10 mg by mouth daily before breakfast. 30 tablet 11   glucose blood (FREESTYLE LITE) test strip Use as instructed 100 each 1   Lancets (FREESTYLE) lancets Use as instructed 100 each 1   potassium chloride (K-DUR) 10 MEQ tablet Take 1 tablet (10 mEq total) by mouth daily. 90 tablet 3   rosuvastatin (CRESTOR) 20 MG tablet Take 1 tablet (20 mg total) by mouth daily. 90 tablet 3   sacubitril-valsartan (ENTRESTO) 97-103 MG Take 1 tablet by mouth 2 (two) times daily. 60 tablet 11   sitaGLIPtin-metformin (JANUMET) 50-1000 MG tablet Take 1 tablet 2 (two) times daily with a meal by mouth. 60 tablet 3   spironolactone (ALDACTONE) 25 MG tablet Take 1 tablet (25 mg total) by mouth daily. 90 tablet 3   torsemide (DEMADEX) 20 MG tablet Take 1 tablet (20 mg total) by mouth daily. 30 tablet 11   traZODone (DESYREL) 50 MG tablet Take 1 tablet (50 mg total) by mouth at bedtime. 30  tablet 3   Current Facility-Administered Medications  Medication Dose Route Frequency Provider Last Rate Last Dose   ivabradine (CORLANOR) tablet 5 mg  5 mg Oral BID WC Nahser, Wonda Cheng, MD         Past Medical History:  Diagnosis Date   Anxiety    CHF (congestive heart failure) ([place])    Diabetes mellitus without complication ([place])    Hypertension    Lung cancer, upper lobe ([place]) 09/2015   Non-small cell in right upper lobe treated with RULectomy by VATS at [facility]    ROS:   All systems reviewed and negative except as noted in the HPI.   Past Surgical History:  Procedure Laterality Date   ABDOMINAL HYSTERECTOMY     BREAST BIOPSY Right 02/10/2018   Biopsy of upper outer quadrant of right breast due to calcifications seen on mammography revealed fat necrosis with calcifications.  Benign so continue annual screening mammograms.   BREAST SURGERY     CESAREAN SECTION     LOBECTOMY Right 10/06/2015   [place] lung cancer treated with VATS right upper lobectomy by Dr. [name] at [facility].     Family History  Problem Relation Age of Onset   Diabetes Mother    Hypertension Mother    Stroke Father    Breast cancer Paternal Aunt    Breast cancer Paternal Aunt  Breast cancer Paternal Aunt    Breast cancer Paternal Aunt      Social History   Socioeconomic History   Marital status: Divorced    Spouse name: Not on file   Number of children: 2   Years of education: Not on file   Highest education level: Not on file  Occupational History   Occupation: Patient Engagement Printmaker: [employer]  Social Needs   Financial resource strain: Not hard at all   Food insecurity    Worry: Never true    Inability: Never true   Transportation needs    Medical: No    Non-medical: No  Tobacco Use   Smoking status: Never Smoker   Smokeless tobacco: Never Used  Substance and Sexual Activity   Alcohol use: No   Drug use: No   Sexual activity: Yes     Birth control/protection: Surgical  Lifestyle   Physical activity    Days per week: 0 days    Minutes per session: 0 min   Stress: Rather much  Relationships   Social connections    Talks on phone: Three times a week    Gets together: Three times a week    Attends religious service: More than 4 times per year    Active member of club or organization: No    Attends meetings of clubs or organizations: Never    Relationship status: Divorced   Intimate partner violence    Fear of current or ex partner: No    Emotionally abused: No    Physically abused: No    Forced sexual activity: No  Other Topics Concern   Not on file  Social History Narrative   Not on file     BP (!) 150/94    Pulse 94    Ht 5\' 5"  (1.651 m)    Wt 148 lb (67.1 kg)    SpO2 99%    BMI 24.63 kg/m   Physical Exam:  Well appearing NAD HEENT: Unremarkable Neck:  6 cm JVD, no thyromegally Lymphatics:  No adenopathy Back:  No CVA tenderness Lungs:  Clear with no wheezes HEART:  Regular rate rhythm, no murmurs, no rubs, no clicks, split S2. Abd:  soft, positive bowel sounds, no organomegally, no rebound, no guarding Ext:  2 plus pulses, no edema, no cyanosis, no clubbing Skin:  No rashes no nodules Neuro:  CN II through XII intact, motor grossly intact  EKG - NSR with LBBB  Assess/Plan: 1. Chronic systolic heart failure - I have discussed the treatment options with the patient. Biv PPM vs Biv ICD were both discussed. While there is a possibility that her EF will improve beyond 40%, her young age suggests that she would benefit from Biv ICD insertion. I have discussed the indications/risks/benefits/goals/expectations of biv ICD insertion with the patient and she wishes to proceed. We will schedule in the next few weeks.  2. HTN - her bp is up a bit in the clinic today. She admits to being very anxious. She is encouraged to maintain a low sodium diet.  Mikle Bosworth.D.

## 2018-10-27 ENCOUNTER — Telehealth: Payer: Self-pay

## 2018-10-27 DIAGNOSIS — I5022 Chronic systolic (congestive) heart failure: Secondary | ICD-10-CM

## 2018-10-27 DIAGNOSIS — I447 Left bundle-branch block, unspecified: Secondary | ICD-10-CM

## 2018-10-27 NOTE — Telephone Encounter (Signed)
Pt scheduled for November 21, 2018.  All work up complete

## 2018-10-31 ENCOUNTER — Ambulatory Visit: Payer: 59 | Admitting: Cardiovascular Disease

## 2018-11-03 ENCOUNTER — Ambulatory Visit: Payer: 59 | Admitting: Family Medicine

## 2018-11-18 ENCOUNTER — Other Ambulatory Visit (HOSPITAL_COMMUNITY)
Admission: RE | Admit: 2018-11-18 | Discharge: 2018-11-18 | Disposition: A | Discharge status: Home / Self Care | Payer: 59 | Source: Ambulatory Visit | Attending: Internal Medicine | Admitting: Internal Medicine

## 2018-11-18 ENCOUNTER — Other Ambulatory Visit: Payer: Self-pay

## 2018-11-18 DIAGNOSIS — U071 COVID-19: Secondary | ICD-10-CM | POA: Diagnosis not present

## 2018-11-18 DIAGNOSIS — Z01812 Encounter for preprocedural laboratory examination: Secondary | ICD-10-CM | POA: Diagnosis not present

## 2018-11-18 LAB — BASIC METABOLIC PANEL
Anion gap: 9 (ref 5–15)
BUN: 15 mg/dL (ref 6–20)
CO2: 24 mmol/L (ref 22–32)
Calcium: 9.3 mg/dL (ref 8.9–10.3)
Chloride: 103 mmol/L (ref 98–111)
Creatinine, Ser: 0.91 mg/dL (ref 0.44–1.00)
GFR calc Af Amer: >60 mL/min (ref >60)
GFR calc non Af Amer: >60 mL/min (ref >60)
Glucose, Bld: 383 mg/dL — ABNORMAL HIGH (ref 70–99)
Potassium: 4.2 mmol/L (ref 3.5–5.1)
Sodium: 136 mmol/L (ref 135–145)

## 2018-11-18 LAB — CBC WITH DIFFERENTIAL/PLATELET
Abs Immature Granulocytes: 0.02 10*3/uL (ref 0.00–0.07)
Basophils Absolute: 0 10*3/uL (ref 0.0–0.1)
Basophils Relative: 1 %
Eosinophils Absolute: 0.1 10*3/uL (ref 0.0–0.5)
Eosinophils Relative: 2 %
HCT: 40.9 % (ref 36.0–46.0)
Hemoglobin: 13.3 g/dL (ref 12.0–15.0)
Immature Granulocytes: 0 %
Lymphocytes Relative: 52 %
Lymphs Abs: 3.2 10*3/uL (ref 0.7–4.0)
MCH: 27.4 pg (ref 26.0–34.0)
MCHC: 32.5 g/dL (ref 30.0–36.0)
MCV: 84.2 fL (ref 80.0–100.0)
Monocytes Absolute: 0.3 10*3/uL (ref 0.1–1.0)
Monocytes Relative: 5 %
Neutro Abs: 2.5 10*3/uL (ref 1.7–7.7)
Neutrophils Relative %: 40 %
Platelets: 341 10*3/uL (ref 150–400)
RBC: 4.86 MIL/uL (ref 3.87–5.11)
RDW: 12.8 % (ref 11.5–15.5)
WBC: 6.2 10*3/uL (ref 4.0–10.5)
nRBC: 0 % (ref 0.0–0.2)

## 2018-11-18 LAB — SARS CORONAVIRUS 2 (TAT 6-24 HRS): SARS Coronavirus 2: POSITIVE — AB

## 2018-11-19 ENCOUNTER — Telehealth: Payer: Self-pay | Admitting: Cardiovascular Disease

## 2018-11-19 NOTE — Progress Notes (Signed)
This encounter was created in error - please disregard.

## 2018-11-19 NOTE — Telephone Encounter (Signed)
FMLA form placed in box for Dr. Acie Fredrickson to review. 11/19/18 vlm

## 2018-11-20 NOTE — Progress Notes (Signed)
Notified Dr Lovena Le that Covid is positive.  He will notify patient and procedure is cancelled.

## 2018-11-21 ENCOUNTER — Ambulatory Visit (HOSPITAL_COMMUNITY): Admission: RE | Admit: 2018-11-21 | Payer: 59 | Source: Home / Self Care | Admitting: Internal Medicine

## 2018-11-21 ENCOUNTER — Encounter (HOSPITAL_COMMUNITY): Admission: RE | Payer: Self-pay | Source: Home / Self Care

## 2018-11-21 SURGERY — BIV ICD INSERTION CRT-D

## 2018-11-26 ENCOUNTER — Telehealth: Payer: Self-pay | Admitting: Cardiovascular Disease

## 2018-11-26 NOTE — Telephone Encounter (Signed)
Patient's appt on 11/21/18 was cancelled. It has not been rescheduled yet. FMLA forms have been put back in Dr. Elmarie Shiley mail box until we know more. 11/26/18 vlm

## 2018-12-02 ENCOUNTER — Ambulatory Visit: Payer: 59

## 2018-12-02 ENCOUNTER — Telehealth: Payer: Self-pay | Admitting: Cardiovascular Disease

## 2018-12-02 NOTE — Telephone Encounter (Signed)
Patient called to inquire about FMLA paperwork. Explained to patient that Dr. Acie Fredrickson has paperwork. Patient stated paperwork needs to be submitted asap. Patient has procedure scheduled for 12/04/18.

## 2018-12-03 ENCOUNTER — Telehealth: Payer: Self-pay | Admitting: Internal Medicine

## 2018-12-03 ENCOUNTER — Telehealth: Payer: Self-pay

## 2018-12-03 NOTE — Telephone Encounter (Signed)
Signed FMLA form returned to [employer] at [facility] bldg. 12/03/18 vlm

## 2018-12-03 NOTE — Telephone Encounter (Signed)
Crystal Basques, MD sent to Nahser, Wonda Cheng, MD; Ann Maki Lanice Schwab, RN; Evans Lance, MD; Damian Leavell, RN        If she is asymptomatic, I dont think this reinfection. We are seeing folks still test positive 2-4 months post infection. The test is still barely positive due to the noninfective viral particles. I think she would be clear to get her cardiac procedure 10 days from her last positive test   Don't re-test for clearance.

## 2018-12-03 NOTE — Telephone Encounter (Signed)
Routed to Dr.Taylor and his RN Anderson Malta due to patient's procedure is ICD insertion.

## 2018-12-04 ENCOUNTER — Other Ambulatory Visit: Payer: Self-pay

## 2018-12-04 ENCOUNTER — Ambulatory Visit (HOSPITAL_COMMUNITY): Payer: 59

## 2018-12-04 ENCOUNTER — Ambulatory Visit (HOSPITAL_COMMUNITY)
Admission: RE | Admit: 2018-12-04 | Discharge: 2018-12-04 | Disposition: A | Discharge status: Home / Self Care | Payer: 59 | Attending: Internal Medicine | Admitting: Internal Medicine

## 2018-12-04 ENCOUNTER — Ambulatory Visit (HOSPITAL_COMMUNITY)
Admission: RE | Disposition: A | Discharge status: Home / Self Care | Payer: Self-pay | Source: Home / Self Care | Attending: Internal Medicine

## 2018-12-04 DIAGNOSIS — I110 Hypertensive heart disease with heart failure: Secondary | ICD-10-CM | POA: Diagnosis not present

## 2018-12-04 DIAGNOSIS — Z7984 Long term (current) use of oral hypoglycemic drugs: Secondary | ICD-10-CM | POA: Diagnosis not present

## 2018-12-04 DIAGNOSIS — Z803 Family history of malignant neoplasm of breast: Secondary | ICD-10-CM | POA: Insufficient documentation

## 2018-12-04 DIAGNOSIS — E119 Type 2 diabetes mellitus without complications: Secondary | ICD-10-CM | POA: Insufficient documentation

## 2018-12-04 DIAGNOSIS — Z95810 Presence of automatic (implantable) cardiac defibrillator: Secondary | ICD-10-CM | POA: Insufficient documentation

## 2018-12-04 DIAGNOSIS — Z79899 Other long term (current) drug therapy: Secondary | ICD-10-CM | POA: Insufficient documentation

## 2018-12-04 DIAGNOSIS — I428 Other cardiomyopathies: Secondary | ICD-10-CM | POA: Insufficient documentation

## 2018-12-04 DIAGNOSIS — I5022 Chronic systolic (congestive) heart failure: Secondary | ICD-10-CM | POA: Diagnosis present

## 2018-12-04 DIAGNOSIS — Z85118 Personal history of other malignant neoplasm of bronchus and lung: Secondary | ICD-10-CM | POA: Diagnosis not present

## 2018-12-04 DIAGNOSIS — I447 Left bundle-branch block, unspecified: Secondary | ICD-10-CM | POA: Diagnosis not present

## 2018-12-04 HISTORY — PX: BIV ICD INSERTION CRT-D: EP1195

## 2018-12-04 LAB — GLUCOSE, CAPILLARY: Glucose-Capillary: 335 mg/dL — ABNORMAL HIGH (ref 70–99)

## 2018-12-04 LAB — SURGICAL PCR SCREEN
MRSA, PCR: NEGATIVE
Staphylococcus aureus: NEGATIVE

## 2018-12-04 SURGERY — BIV ICD INSERTION CRT-D
Anesthesia: LOCAL

## 2018-12-04 MED ORDER — CEFAZOLIN SODIUM-DEXTROSE 2-4 GM/100ML-% IV SOLN
INTRAVENOUS | Status: AC
  Filled 2018-12-04: qty 100

## 2018-12-04 MED ORDER — MIDAZOLAM HCL 5 MG/5ML IJ SOLN
INTRAMUSCULAR | Status: AC
  Filled 2018-12-04: qty 5

## 2018-12-04 MED ORDER — ONDANSETRON HCL 4 MG/2ML IJ SOLN: 4.0000 mg | Freq: Four times a day (QID) | INTRAMUSCULAR | Status: DC | PRN

## 2018-12-04 MED ORDER — CHLORHEXIDINE GLUCONATE 4 % EX LIQD: 4.0000 "application " | Freq: Once | CUTANEOUS | Status: DC

## 2018-12-04 MED ORDER — HEPARIN (PORCINE) IN NACL 1000-0.9 UT/500ML-% IV SOLN
INTRAVENOUS | Status: DC | PRN
  Administered 2018-12-04: 500 mL

## 2018-12-04 MED ORDER — SODIUM CHLORIDE 0.9 % IV SOLN
INTRAVENOUS | Status: DC
  Administered 2018-12-04: 07:00:00 via INTRAVENOUS

## 2018-12-04 MED ORDER — MUPIROCIN 2 % EX OINT
TOPICAL_OINTMENT | CUTANEOUS | Status: AC
  Administered 2018-12-04: 1
  Filled 2018-12-04: qty 22

## 2018-12-04 MED ORDER — ACETAMINOPHEN 325 MG PO TABS
325.0000 mg | ORAL_TABLET | ORAL | Status: DC | PRN
  Administered 2018-12-04 (×2): 650 mg via ORAL
  Filled 2018-12-04: qty 2

## 2018-12-04 MED ORDER — IOHEXOL 350 MG/ML SOLN
INTRAVENOUS | Status: DC | PRN
  Administered 2018-12-04: 09:00:00 15 mL

## 2018-12-04 MED ORDER — FENTANYL CITRATE (PF) 100 MCG/2ML IJ SOLN
INTRAMUSCULAR | Status: AC
  Filled 2018-12-04: qty 2

## 2018-12-04 MED ORDER — ACETAMINOPHEN 325 MG PO TABS
ORAL_TABLET | ORAL | Status: AC
  Administered 2018-12-04: 650 mg via ORAL
  Filled 2018-12-04: qty 2

## 2018-12-04 MED ORDER — CEFAZOLIN SODIUM-DEXTROSE 1-4 GM/50ML-% IV SOLN
1.0000 g | Freq: Three times a day (TID) | INTRAVENOUS | Status: AC
  Administered 2018-12-04: 1 g via INTRAVENOUS
  Filled 2018-12-04: qty 50

## 2018-12-04 MED ORDER — CEFAZOLIN SODIUM-DEXTROSE 2-4 GM/100ML-% IV SOLN
2.0000 g | INTRAVENOUS | Status: AC
  Administered 2018-12-04: 2 g via INTRAVENOUS

## 2018-12-04 MED ORDER — MIDAZOLAM HCL 5 MG/5ML IJ SOLN
INTRAMUSCULAR | Status: DC | PRN
  Administered 2018-12-04 (×5): 1 mg via INTRAVENOUS
  Administered 2018-12-04 (×2): 2 mg via INTRAVENOUS
  Administered 2018-12-04: 1 mg via INTRAVENOUS

## 2018-12-04 MED ORDER — FENTANYL CITRATE (PF) 100 MCG/2ML IJ SOLN
INTRAMUSCULAR | Status: DC | PRN
  Administered 2018-12-04 (×4): 12.5 ug via INTRAVENOUS
  Administered 2018-12-04 (×2): 25 ug via INTRAVENOUS

## 2018-12-04 MED ORDER — LIDOCAINE HCL 1 % IJ SOLN
INTRAMUSCULAR | Status: AC
  Filled 2018-12-04: qty 60

## 2018-12-04 MED ORDER — SODIUM CHLORIDE 0.9 % IV SOLN
80.0000 mg | INTRAVENOUS | Status: AC
  Administered 2018-12-04: 80 mg

## 2018-12-04 MED ORDER — SODIUM CHLORIDE 0.9 % IV SOLN
INTRAVENOUS | Status: AC
  Filled 2018-12-04: qty 2

## 2018-12-04 SURGICAL SUPPLY — 19 items
CABLE SURGICAL S-101-97-12 (CABLE) ×2 IMPLANT
CATH CPS DIRECT 135 DS2C020 (CATHETERS) ×1 IMPLANT
CATH HEX JOSEPH 2-5-2 65CM 6F (CATHETERS) ×1 IMPLANT
CATH RIGHTSITE C315HIS02 (CATHETERS) ×1 IMPLANT
CPS IMPLANT KIT 410190 (MISCELLANEOUS) ×1 IMPLANT
ICD UNIFY ASUR CRT CD3357-40Q (ICD Generator) ×1 IMPLANT
KIT ESSENTIALS PG (KITS) ×1 IMPLANT
LEAD DURATA 7122Q-65CM (Lead) ×1 IMPLANT
LEAD SELECT SECURE 3830 383069 (Lead) IMPLANT
LEAD TENDRIL MRI 52CM LPA1200M (Lead) ×1 IMPLANT
PAD PRO RADIOLUCENT 2001M-C (PAD) ×2 IMPLANT
SELECT SECURE 3830 383069 (Lead) ×2 IMPLANT
SHEATH 8FR PRELUDE SNAP 13 (SHEATH) ×2 IMPLANT
SHEATH 9.5FR PRELUDE SNAP 13 (SHEATH) ×1 IMPLANT
SHIELD RADPAD SCOOP 12X17 (MISCELLANEOUS) ×1 IMPLANT
SLITTER 6232ADJ (MISCELLANEOUS) ×1 IMPLANT
TRAY PACEMAKER INSERTION (PACKS) ×2 IMPLANT
WIRE ACUITY WHISPER EDS 4648 (WIRE) ×1 IMPLANT
WIRE MAILMAN 182CM (WIRE) ×1 IMPLANT

## 2018-12-04 NOTE — H&P (Signed)
HPI Crystal Bryant is referred today by Dr. Acie Fredrickson for evaluation of chronic systolic heart failure in the setting of LBBB to consider ICD insertion. She has a h/o non-small cell lung CA, s/p excision with no need for chemotherapy. She has a h/o HTN and was found to have congestive heart failure about 3 years ago. She initially was non-compliant but has been on maximal medical therapy. She has had a repeat echo which demonstrated an EF of 30% and clear cut dysnergy between the septal and lateral walls. She has LBBB and a QRS duration of 140. She has class 3 symptoms.  No Known Allergies         Current Outpatient Medications  Medication Sig Dispense Refill  . acetaminophen (TYLENOL) 500 MG tablet Take 1,000 mg by mouth every 6 (six) hours as needed for mild pain or moderate pain.    . Blood Glucose Monitoring Suppl (FREESTYLE LITE) DEVI Use As Directed 1 Device 0  . carvedilol (COREG) 25 MG tablet Take 1.5 tablets (37.5 mg total) by mouth 2 (two) times daily. 270 tablet 3  . empagliflozin (JARDIANCE) 10 MG TABS tablet Take 10 mg by mouth daily before breakfast. 30 tablet 11  . glucose blood (FREESTYLE LITE) test strip Use as instructed 100 each 1  . Lancets (FREESTYLE) lancets Use as instructed 100 each 1  . potassium chloride (K-DUR) 10 MEQ tablet Take 1 tablet (10 mEq total) by mouth daily. 90 tablet 3  . rosuvastatin (CRESTOR) 20 MG tablet Take 1 tablet (20 mg total) by mouth daily. 90 tablet 3  . sacubitril-valsartan (ENTRESTO) 97-103 MG Take 1 tablet by mouth 2 (two) times daily. 60 tablet 11  . sitaGLIPtin-metformin (JANUMET) 50-1000 MG tablet Take 1 tablet 2 (two) times daily with a meal by mouth. 60 tablet 3  . spironolactone (ALDACTONE) 25 MG tablet Take 1 tablet (25 mg total) by mouth daily. 90 tablet 3  . torsemide (DEMADEX) 20 MG tablet Take 1 tablet (20 mg total) by mouth daily. 30 tablet 11  . traZODone (DESYREL) 50 MG tablet Take 1 tablet (50 mg total) by mouth at bedtime. 30  tablet 3            Current Facility-Administered Medications  Medication Dose Route Frequency Provider Last Rate Last Dose  . ivabradine (CORLANOR) tablet 5 mg  5 mg Oral BID WC Nahser, Wonda Cheng, MD             Past Medical History:  Diagnosis Date  . Anxiety   . CHF (congestive heart failure) ([place])   . Diabetes mellitus without complication ([place])   . Hypertension   . Lung cancer, upper lobe ([place]) 09/2015   Non-small cell in right upper lobe treated with RULectomy by VATS at [facility]    ROS:   All systems reviewed and negative except as noted in the HPI.        Past Surgical History:  Procedure Laterality Date  . ABDOMINAL HYSTERECTOMY    . BREAST BIOPSY Right 02/10/2018   Biopsy of upper outer quadrant of right breast due to calcifications seen on mammography revealed fat necrosis with calcifications.  Benign so continue annual screening mammograms.  Mar[facility] Kitchen BREAST SURGERY    . CESAREAN SECTION    . LOBECTOMY Right 10/06/2015   [place] lung cancer treated with VATS right upper lobectomy by Dr. [name] at [facility].          Family History  Problem Relation Age of Onset  . Diabetes Mother   .  Hypertension Mother   . Stroke Father   . Breast cancer Paternal Aunt   . Breast cancer Paternal Aunt   . Breast cancer Paternal Aunt   . Breast cancer Paternal Aunt      Social History        Socioeconomic History  . Marital status: Divorced    Spouse name: Not on file  . Number of children: 2  . Years of education: Not on file  . Highest education level: Not on file  Occupational History  . Occupation: Patient Engineer, manufacturing systems: [employer]  . Financial resource strain: Not hard at all  . Food insecurity    Worry: Never true    Inability: Never true  . Transportation needs    Medical: No    Non-medical: No  Tobacco Use  . Smoking status: Never Smoker  . Smokeless tobacco: Never Used  Substance and  Sexual Activity  . Alcohol use: No  . Drug use: No  . Sexual activity: Yes    Birth control/protection: Surgical  Lifestyle  . Physical activity    Days per week: 0 days    Minutes per session: 0 min  . Stress: Rather much  Relationships  . Social Herbalist on phone: Three times a week    Gets together: Three times a week    Attends religious service: More than 4 times per year    Active member of club or organization: No    Attends meetings of clubs or organizations: Never    Relationship status: Divorced  . Intimate partner violence    Fear of current or ex partner: No    Emotionally abused: No    Physically abused: No    Forced sexual activity: No  Other Topics Concern  . Not on file  Social History Narrative  . Not on file     BP (!) 150/94   Pulse 94   Ht 5\' 5"  (1.651 m)   Wt 148 lb (67.1 kg)   SpO2 99%   BMI 24.63 kg/m   Physical Exam:  Well appearing NAD HEENT: Unremarkable Neck:  6 cm JVD, no thyromegally Lymphatics:  No adenopathy Back:  No CVA tenderness Lungs:  Clear with no wheezes HEART:  Regular rate rhythm, no murmurs, no rubs, no clicks, split S2. Abd:  soft, positive bowel sounds, no organomegally, no rebound, no guarding Ext:  2 plus pulses, no edema, no cyanosis, no clubbing Skin:  No rashes no nodules Neuro:  CN II through XII intact, motor grossly intact  EKG - NSR with LBBB  Assess/Plan: 1. Chronic systolic heart failure - I have discussed the treatment options with the patient. Biv PPM vs Biv ICD were both discussed. While there is a possibility that her EF will improve beyond 40%, her young age suggests that she would benefit from Biv ICD insertion. I have discussed the indications/risks/benefits/goals/expectations of biv ICD insertion with the patient and she wishes to proceed. We will schedule in the next few weeks.  2. HTN - her bp is up a bit in the clinic today. She admits to being very  anxious. She is encouraged to maintain a low sodium diet.  Ponciano Ort.  EP Attending  Patient seen and examined. Agree with the findings as noted above. The patient presents today for ICD insertion. No changes since above.   Mikle Bosworth.D.

## 2018-12-04 NOTE — Discharge Instructions (Signed)
After Your ICD (Implantable Cardiac Defibrillator)    You have a [employer] Jude ICD   Do not lift your arm above shoulder height for 1 week after your procedure. After 7 days, you may progress as below.     Thursday December 11, 2018  Friday December 12, 2018 Saturday December 13, 2018 Sunday December 14, 2018    Do not lift, push, pull, or carry anything over 10 pounds with the affected arm until 6 weeks (Thursday January 15, 2019 ) after your procedure.    Do not drive until you have been seen for your wound check, or as long as instructed by your healthcare provider.    Monitor your defibrillator site for redness, swelling, and drainage. Call the device clinic at [phone] if you experience these symptoms or fever/chills.   If your incision is sealed with Steri-strips or staples, you may shower 7 days after your procedure. Do not remove the steri-strips or let the shower hit directly on your site. You may wash around your site with soap and water. Avoid lotions, ointments, or perfumes over your incision until it is well-healed.   You may use a hot tub or a pool AFTER your wound check appointment if the incision is completely closed.   Your ICD  may be MRI compatible. This will be discussed at your next office visit/wound check.    Your ICD is designed to protect you from life threatening heart rhythms. Because of this, you may receive a shock.   o 1 shock with no symptoms:  Call the office during business hours. o 1 shock with symptoms (chest pain, chest pressure, dizziness, lightheadedness, shortness of breath, overall feeling unwell):  Call 911. o If you experience 2 or more shocks in 24 hours:  Call 911. o If you receive a shock, you should not drive for 6 months per the [place] DMV IF you receive appropriate therapy from your ICD.    ICD Alerts:  Some alerts are vibratory and others beep. These are NOT emergencies. Please call our office to let us know. If this occurs at night  or on weekends, it can wait until the next business day. Send a remote transmission.   If your device is capable of reading fluid status (for heart failure), you will be offered monthly monitoring to review this with you.    Remote monitoring is used to monitor your ICD from home. This monitoring is scheduled every 91 days by our office. It allows [place] to keep an eye on the functioning of your device to ensure it is working properly. You will routinely see your Electrophysiologist annually (more often if necessary).    Cardioverter Defibrillator Implantation, Care After This sheet gives you information about how to care for yourself after your procedure. Your health care provider may also give you more specific instructions. If you have problems or questions, contact your health care provider. What can I expect after the procedure? After the procedure, it is common to have:  Some pain. It may last a few days.  A slight bump over the skin where the device was placed. Sometimes, it is possible to feel the device under the skin. This is normal.  During the months and years after your procedure, your health care provider will check the device, the leads, and the battery every few months. Eventually, when the battery is low, the device will be replaced.  You should receive your defibrillator ID card for your new device in the next  4-8 weeks.  Follow these instructions at home: Medicines  Take over-the-counter and prescription medicines only as told by your health care provider.  If you were prescribed an antibiotic medicine, take it as told by your health care provider. Do not stop taking the antibiotic even if you start to feel better. Incision care  Follow instructions from your health care provider about how to take care of your incision area. Make sure you: ? Leave stitches (sutures), skin glue, or adhesive strips in place. These skin closures may need to stay in place for 2 weeks or  longer. If adhesive strip edges start to loosen and curl up, you may trim the loose edges. Do not remove adhesive strips completely unless your health care provider tells you to do that.  Check your incision area every day for signs of infection. Check for: ? More redness, swelling, or pain. ? More fluid or blood. ? Warmth. ? Pus or a bad smell.  Do not use lotions or ointments near the incision area unless told by your health care provider.  Keep the incision area clean and dry for 7 days after the procedure or for as long as told by your health care provider. It takes several weeks for the incision site to heal completely.  Do not take baths, swim, or use a hot tub until your health care provider approves. Activity  Try to walk a little every day. Exercising is important after this procedure. Also, use your shoulder on the side of the defibrillator in daily tasks that do not require a lot of motion.  For at least 1 week: ? Do not lift your upper arm above your shoulders. This means no tennis, golf, or swimming for this period of time. If you tend to sleep with your arm above your head, use a restraint to prevent this during sleep.  For at least 6 weeks: ? Avoid sudden jerking, pulling, or chopping movements that pull your upper arm far away from your body.  Ask your health care provider when you may go back to work.  Check with your health care provider before you start to drive or play sports. Electric and magnetic fields  Tell all health care providers that you have a defibrillator. This may prevent them from giving you an MRI scan because strong magnets are used for that test.  If you must pass through a metal detector, quickly walk through it. Do not stop under the detector, and do not stand near it.  Avoid places or objects that have a strong electric or magnetic field, including: ? Airport Herbalist. At the airport, let officials know that you have a defibrillator. Your  defibrillator ID card will let you be checked in a way that is safe for you and will not damage your defibrillator. Also, do not let a security person wave a magnetic wand near your defibrillator. That can make it stop working. ? Power plants. ? Large electrical generators. ? Anti-theft systems or electronic article surveillance (EAS). ? Radiofrequency transmission towers, such as cell phone and radio towers.  Do not use amateur (ham) radio equipment or electric (arc) welding torches. Some devices are safe to use if held at least 12 inches (30 cm) from your defibrillator. These include power tools, lawn mowers, and speakers. If you are unsure if something is safe to use, ask your health care provider.  Do not use MP3 player headphones. They have magnets.  You may safely use electric blankets, heating pads,  computers, and microwave ovens.  When using your cell phone, hold it to the ear that is on the opposite side from the defibrillator. Do not leave your cell phone in a pocket over the defibrillator. General instructions  Follow diet instructions from your health care provider, if this applies.  Always keep your defibrillator ID card with you. The card should list the implant date, device model, and manufacturer. Consider wearing a medical alert bracelet or necklace.  Have your defibrillator checked every 3-6 months or as often as told by your health care provider. Most defibrillators last for 4-8 years.  Keep all follow-up visits as told by your health care provider. This is important for your health care provider to make sure your chest is healing the way it should. Ask your health care provider when you should come back to have your stitches or staples taken out. Contact a health care provider if:  You gain weight suddenly.  Your legs or feet swell more than they have before.  It feels like your heart is fluttering or skipping beats (heart palpitations).  You have more redness,  swelling, or pain around your incision.  You have more fluid or blood coming from your incision.  Your incision feels warm to the touch.  You have pus or a bad smell coming from your incision.  You have a fever. Get help right away if:  You have chest pain.  You feel more than one shock.  You feel more short of breath than you have felt before.  You feel more light-headed than you have felt before.  Your incision starts to open up. This information is not intended to replace advice given to you by your health care provider. Make sure you discuss any questions you have with your health care provider.

## 2018-12-05 ENCOUNTER — Encounter (HOSPITAL_COMMUNITY): Payer: Self-pay | Admitting: Internal Medicine

## 2018-12-05 MED FILL — Lidocaine HCl Local Inj 1%: INTRAMUSCULAR | Qty: 60 | Status: AC

## 2018-12-05 MED FILL — Gentamicin Sulfate Inj 40 MG/ML: INTRAMUSCULAR | Qty: 80 | Status: AC

## 2018-12-16 ENCOUNTER — Other Ambulatory Visit: Payer: Self-pay

## 2018-12-16 ENCOUNTER — Ambulatory Visit (INDEPENDENT_AMBULATORY_CARE_PROVIDER_SITE_OTHER): Payer: 59 | Admitting: Student

## 2018-12-16 DIAGNOSIS — I5022 Chronic systolic (congestive) heart failure: Secondary | ICD-10-CM

## 2018-12-16 LAB — CUP PACEART INCLINIC DEVICE CHECK
Battery Remaining Longevity: 51 mo
Brady Statistic RA Percent Paced: 0 %
Brady Statistic RV Percent Paced: 99.46 %
Date Time Interrogation Session: 20201117093934
HighPow Impedance: 57.375
Implantable Lead Implant Date: 20201105
Implantable Lead Implant Date: 20201105
Implantable Lead Implant Date: 20201105
Implantable Lead Location: 753859
Implantable Lead Location: 753860
Implantable Lead Location: 753862
Implantable Lead Model: 3830
Implantable Pulse Generator Implant Date: 20201105
Lead Channel Impedance Value: 300 Ohm
Lead Channel Impedance Value: 400 Ohm
Lead Channel Impedance Value: 475 Ohm
Lead Channel Pacing Threshold Amplitude: 0.5 V
Lead Channel Pacing Threshold Amplitude: 0.5 V
Lead Channel Pacing Threshold Amplitude: 0.5 V
Lead Channel Pacing Threshold Amplitude: 0.5 V
Lead Channel Pacing Threshold Amplitude: 0.75 V
Lead Channel Pacing Threshold Amplitude: 0.75 V
Lead Channel Pacing Threshold Pulse Width: 0.05 ms
Lead Channel Pacing Threshold Pulse Width: 0.05 ms
Lead Channel Pacing Threshold Pulse Width: 0.5 ms
Lead Channel Pacing Threshold Pulse Width: 0.5 ms
Lead Channel Pacing Threshold Pulse Width: 0.5 ms
Lead Channel Pacing Threshold Pulse Width: 0.5 ms
Lead Channel Sensing Intrinsic Amplitude: 2.9 mV
Lead Channel Setting Pacing Amplitude: 0.25 V
Lead Channel Setting Pacing Amplitude: 3.5 V
Lead Channel Setting Pacing Amplitude: 3.5 V
Lead Channel Setting Pacing Pulse Width: 0.05 ms
Lead Channel Setting Pacing Pulse Width: 0.5 ms
Lead Channel Setting Sensing Sensitivity: 0.5 mV
Pulse Gen Serial Number: 9849338

## 2018-12-16 NOTE — Progress Notes (Signed)
Wound check appointment. Steri-strips removed. Wound without redness or edema. Incision edges approximated, wound well healed. Normal device function. Thresholds, sensing, and impedances consistent with implant measurements. Device programmed at 3.5V for extra safety margin until 3 month visit. RV programmed to sub-threshold for LV/LBBB pacing. Histogram distribution appropriate for patient and level of activity. No mode switches or ventricular arrhythmias noted. OF NOTE, Pt went into atrial tachycardia at 130 bpm for approximately 45-60 seconds after atrial threshold test at 105 bpm. Patient educated about wound care, arm mobility, lifting restrictions, shock plan. ROV in 3 months with Dr. Lovena Le. Will review atrial tach episode.

## 2018-12-18 ENCOUNTER — Other Ambulatory Visit: Payer: Self-pay | Admitting: Cardiovascular Disease

## 2018-12-18 MED FILL — TORSEMIDE 20 MG TABLET: 20 | 30 days supply | Qty: 30 | Fill #2

## 2018-12-18 MED FILL — traZODone HCL 50 MG TABS: 50 | 30 days supply | Qty: 30 | Fill #1

## 2018-12-18 MED FILL — spIRONOLACTONE 25 MG TABS: 25 | 90 days supply | Qty: 45 | Fill #1

## 2018-12-19 MED FILL — CARVEDILOL 25 MG TABLET: 25 | 90 days supply | Qty: 270 | Fill #0

## 2018-12-24 ENCOUNTER — Other Ambulatory Visit: Payer: Self-pay | Admitting: *Deleted

## 2018-12-24 ENCOUNTER — Telehealth: Payer: Self-pay | Admitting: *Deleted

## 2018-12-24 MED ORDER — CARVEDILOL 25 MG PO TABS: 50.0000 mg | ORAL_TABLET | Freq: Two times a day (BID) | ORAL | 2 refills | Status: DC

## 2018-12-24 NOTE — Telephone Encounter (Signed)
Left detailed VM of Medication changes of Coreg 50 mg twice day due device check feedback with Dr. Lovena Le and Chalmers Cater PA-C   for recommendations and clinic contact number to call back to assure she got message

## 2019-02-23 MED FILL — TORSEMIDE 20 MG TABLET: 20 | 30 days supply | Qty: 30 | Fill #2

## 2019-02-23 MED FILL — traZODone HCL 50 MG TABS: 50 | 30 days supply | Qty: 30 | Fill #1

## 2019-02-24 ENCOUNTER — Encounter: Payer: 59 | Admitting: Internal Medicine

## 2019-03-10 ENCOUNTER — Ambulatory Visit (INDEPENDENT_AMBULATORY_CARE_PROVIDER_SITE_OTHER): Payer: 59 | Admitting: *Deleted

## 2019-03-10 DIAGNOSIS — I5022 Chronic systolic (congestive) heart failure: Secondary | ICD-10-CM | POA: Diagnosis not present

## 2019-03-10 LAB — CUP PACEART REMOTE DEVICE CHECK
Battery Remaining Longevity: 50 mo
Battery Remaining Percentage: 87 %
Battery Voltage: 3.02 V
Brady Statistic AP VP Percent: 1 %
Brady Statistic AP VS Percent: 1 %
Brady Statistic AS VP Percent: 99 %
Brady Statistic AS VS Percent: 1 %
Brady Statistic RA Percent Paced: 1 %
Date Time Interrogation Session: 20210209020015
HighPow Impedance: 61 Ohm
HighPow Impedance: 61 Ohm
Implantable Lead Implant Date: 20201105
Implantable Lead Implant Date: 20201105
Implantable Lead Implant Date: 20201105
Implantable Lead Location: 753859
Implantable Lead Location: 753860
Implantable Lead Location: 753862
Implantable Lead Model: 3830
Implantable Pulse Generator Implant Date: 20201105
Lead Channel Impedance Value: 300 Ohm
Lead Channel Impedance Value: 400 Ohm
Lead Channel Impedance Value: 510 Ohm
Lead Channel Pacing Threshold Amplitude: 0.5 V
Lead Channel Pacing Threshold Amplitude: 0.5 V
Lead Channel Pacing Threshold Amplitude: 0.75 V
Lead Channel Pacing Threshold Pulse Width: 0.05 ms
Lead Channel Pacing Threshold Pulse Width: 0.5 ms
Lead Channel Pacing Threshold Pulse Width: 0.5 ms
Lead Channel Sensing Intrinsic Amplitude: 1 mV
Lead Channel Sensing Intrinsic Amplitude: 7.8 mV
Lead Channel Setting Pacing Amplitude: 0.25 V
Lead Channel Setting Pacing Amplitude: 3.5 V
Lead Channel Setting Pacing Amplitude: 3.5 V
Lead Channel Setting Pacing Pulse Width: 0.05 ms
Lead Channel Setting Pacing Pulse Width: 0.5 ms
Lead Channel Setting Sensing Sensitivity: 0.5 mV
Pulse Gen Serial Number: 9849338

## 2019-03-11 NOTE — Progress Notes (Signed)
ICD Remote  

## 2019-03-12 ENCOUNTER — Encounter: Payer: Self-pay | Admitting: Internal Medicine

## 2019-03-12 ENCOUNTER — Ambulatory Visit: Payer: 59 | Admitting: Internal Medicine

## 2019-03-12 ENCOUNTER — Ambulatory Visit (HOSPITAL_COMMUNITY): Payer: 59

## 2019-03-12 ENCOUNTER — Ambulatory Visit
Admission: RE | Admit: 2019-03-12 | Discharge: 2019-03-12 | Disposition: A | Discharge status: Home / Self Care | Payer: 59 | Source: Ambulatory Visit | Attending: Internal Medicine | Admitting: Internal Medicine

## 2019-03-12 ENCOUNTER — Other Ambulatory Visit: Payer: Self-pay

## 2019-03-12 VITALS — BP 164/98 | HR 100 | Ht 65.0 in | Wt 146.0 lb

## 2019-03-12 DIAGNOSIS — Z95810 Presence of automatic (implantable) cardiac defibrillator: Secondary | ICD-10-CM

## 2019-03-12 DIAGNOSIS — T82110A Breakdown (mechanical) of cardiac electrode, initial encounter: Secondary | ICD-10-CM | POA: Diagnosis not present

## 2019-03-12 DIAGNOSIS — I5022 Chronic systolic (congestive) heart failure: Secondary | ICD-10-CM | POA: Diagnosis not present

## 2019-03-12 DIAGNOSIS — I447 Left bundle-branch block, unspecified: Secondary | ICD-10-CM

## 2019-03-12 LAB — CUP PACEART INCLINIC DEVICE CHECK
Battery Remaining Longevity: 46 mo
Brady Statistic RA Percent Paced: 0 %
Brady Statistic RV Percent Paced: 99.78 %
Date Time Interrogation Session: 20210211093200
HighPow Impedance: 60.75 Ohm
Implantable Lead Implant Date: 20201105
Implantable Lead Implant Date: 20201105
Implantable Lead Implant Date: 20201105
Implantable Lead Location: 753859
Implantable Lead Location: 753860
Implantable Lead Location: 753862
Implantable Lead Model: 3830
Implantable Pulse Generator Implant Date: 20201105
Lead Channel Impedance Value: 287.5 Ohm
Lead Channel Impedance Value: 362.5 Ohm
Lead Channel Impedance Value: 425 Ohm
Lead Channel Pacing Threshold Amplitude: 0.5 V
Lead Channel Pacing Threshold Amplitude: 0.5 V
Lead Channel Pacing Threshold Pulse Width: 0.05 ms
Lead Channel Pacing Threshold Pulse Width: 0.5 ms
Lead Channel Sensing Intrinsic Amplitude: 3 mV
Lead Channel Sensing Intrinsic Amplitude: 8.9 mV
Lead Channel Setting Pacing Amplitude: 0.25 V
Lead Channel Setting Pacing Amplitude: 3.5 V
Lead Channel Setting Pacing Amplitude: 3.5 V
Lead Channel Setting Pacing Pulse Width: 0.05 ms
Lead Channel Setting Pacing Pulse Width: 0.5 ms
Lead Channel Setting Sensing Sensitivity: 0.5 mV
Pulse Gen Serial Number: 9849338

## 2019-03-12 NOTE — Progress Notes (Signed)
HPI Ms. Washingto returns today for followup. She is a pleasant 49 yo woman with a non-ischemic CM, EF 30%, chronic class 2 CHF, and HTN. She underwent biv ICD insertion about 4 months ago. She notes that she has had nausea after refilling her torsemide. Her CHF symptoms are unchanged. In review of her records, she had diaghragmatic stimulation when we tried to place her LV lead so she ultimately received an LV lead.  No Known Allergies   Current Outpatient Medications  Medication Sig Dispense Refill  . Blood Glucose Monitoring Suppl (FREESTYLE LITE) DEVI Use As Directed 1 Device 0  . carvedilol (COREG) 25 MG tablet Take 2 tablets (50 mg total) by mouth 2 (two) times daily with a meal. 360 tablet 2  . empagliflozin (JARDIANCE) 10 MG TABS tablet Take 10 mg by mouth daily before breakfast. 30 tablet 11  . glucose blood (FREESTYLE LITE) test strip Use as instructed 100 each 1  . Lancets (FREESTYLE) lancets Use as instructed 100 each 1  . metFORMIN (GLUCOPHAGE) 1000 MG tablet Take 1,000 mg by mouth 2 (two) times daily.    . potassium chloride (K-DUR) 10 MEQ tablet Take 1 tablet (10 mEq total) by mouth daily. 90 tablet 3  . sacubitril-valsartan (ENTRESTO) 97-103 MG Take 1 tablet by mouth 2 (two) times daily. 60 tablet 11  . sitaGLIPtin-metformin (JANUMET) 50-1000 MG tablet Take 1 tablet by mouth 2 (two) times daily with a meal.    . spironolactone (ALDACTONE) 25 MG tablet Take 1 tablet (25 mg total) by mouth daily. 90 tablet 3  . torsemide (DEMADEX) 20 MG tablet Take 1 tablet (20 mg total) by mouth daily. 30 tablet 11  . traZODone (DESYREL) 50 MG tablet Take 1 tablet (50 mg total) by mouth at bedtime. 30 tablet 3   Current Facility-Administered Medications  Medication Dose Route Frequency Provider Last Rate Last Admin  . ivabradine (CORLANOR) tablet 5 mg  5 mg Oral BID WC Nahser, Wonda Cheng, MD         Past Medical History:  Diagnosis Date  . Anxiety   . CHF (congestive heart failure)  ([place])   . Diabetes mellitus without complication ([place])   . Hypertension   . Lung cancer, upper lobe ([place]) 09/2015   Non-small cell in right upper lobe treated with RULectomy by VATS at [facility] [place]    ROS:   All systems reviewed and negative except as noted in the HPI.   Past Surgical History:  Procedure Laterality Date  . ABDOMINAL HYSTERECTOMY    . BIV ICD INSERTION CRT-D N/A 12/04/2018   Procedure: BIV ICD INSERTION CRT-D;  Surgeon: Evans Lance, MD;  Location: [place] [facility];  Service: Cardiovascular;  Laterality: N/A;  . BREAST BIOPSY Right 02/10/2018   Biopsy of upper outer quadrant of right breast due to calcifications seen on mammography revealed fat necrosis with calcifications.  Benign so continue annual screening mammograms.  Marland Kitchen BREAST SURGERY    . CESAREAN SECTION    . LOBECTOMY Right 10/06/2015   [place] lung cancer treated with VATS right upper lobectomy by Dr. [name] at [facility].     Family History  Problem Relation Age of Onset  . Diabetes Mother   . Hypertension Mother   . Stroke Father   . Breast cancer Paternal Aunt   . Breast cancer Paternal Aunt   . Breast cancer Paternal Aunt   . Breast cancer Paternal Aunt      Social History  Socioeconomic History  . Marital status: Divorced    Spouse name: Not on file  . Number of children: 2  . Years of education: Not on file  . Highest education level: Not on file  Occupational History  . Occupation: Patient Engineer, manufacturing systems: [employer]  Tobacco Use  . Smoking status: Never Smoker  . Smokeless tobacco: Never Used  Substance and Sexual Activity  . Alcohol use: No  . Drug use: No  . Sexual activity: Yes    Birth control/protection: Surgical  Other Topics Concern  . Not on file  Social History Narrative  . Not on file   Social Determinants of Health   Financial Resource Strain: Low Risk   . Difficulty of Paying Living Expenses: Not hard at all  Food Insecurity: No Food Insecurity  .  Worried About Charity fundraiser in the Last Year: Never true  . Ran Out of Food in the Last Year: Never true  Transportation Needs: No Transportation Needs  . Lack of Transportation (Medical): No  . Lack of Transportation (Non-Medical): No  Physical Activity: Inactive  . Days of Exercise per Week: 0 days  . Minutes of Exercise per Session: 0 min  Stress: Stress Concern Present  . Feeling of Stress : Rather much  Social Connections: Somewhat Isolated  . Frequency of Communication with Friends and Family: Three times a week  . Frequency of Social Gatherings with Friends and Family: Three times a week  . Attends Religious Services: More than 4 times per year  . Active Member of Clubs or Organizations: No  . Attends Archivist Meetings: Never  . Marital Status: Divorced  Human resources officer Violence: Not At Risk  . Fear of Current or Ex-Partner: No  . Emotionally Abused: No  . Physically Abused: No  . Sexually Abused: No     BP (!) 164/98   Pulse 100   Ht 5\' 5"  (1.651 m)   Wt 146 lb (66.2 kg)   SpO2 99%   BMI 24.30 kg/m   Physical Exam:  Well appearing NAD HEENT: Unremarkable Neck:  No JVD, no thyromegally Lymphatics:  No adenopathy Back:  No CVA tenderness Lungs:  Clear with no wheezes HEART:  Regular rate rhythm with a prominent S3.  Abd:  soft, positive bowel sounds, no organomegally, no rebound, no guarding Ext:  2 plus pulses, no edema, no cyanosis, no clubbing Skin:  No rashes no nodules Neuro:  CN II through XII intact, motor grossly intact  EKG - nsr with LBBB  DEVICE  Normal device function.  See PaceArt for details.   Assess/Plan: 1. Chronic systolic heart failure - her symptoms are class 2. I am unclear as to the function of her Left bundle pacing lead.  2. ICD - I have asked her to check a cxr as her Left bundle pacing appears to suggest that she may be capturing the RA.  3. Nausea - this has occurred when she restarted toresemide. I have  discussed with Dr. Acie Fredrickson.   Mikle Bosworth.D.

## 2019-03-12 NOTE — H&P (View-Only) (Signed)
HPI Crystal Bryant returns today for followup. She is a pleasant 49 yo woman with a non-ischemic CM, EF 30%, chronic class 2 CHF, and HTN. She underwent biv ICD insertion about 4 months ago. She notes that she has had nausea after refilling her torsemide. Her CHF symptoms are unchanged. In review of her records, she had diaghragmatic stimulation when we tried to place her LV lead so she ultimately received an LV lead.  No Known Allergies   Current Outpatient Medications  Medication Sig Dispense Refill  . Blood Glucose Monitoring Suppl (FREESTYLE LITE) DEVI Use As Directed 1 Device 0  . carvedilol (COREG) 25 MG tablet Take 2 tablets (50 mg total) by mouth 2 (two) times daily with a meal. 360 tablet 2  . empagliflozin (JARDIANCE) 10 MG TABS tablet Take 10 mg by mouth daily before breakfast. 30 tablet 11  . glucose blood (FREESTYLE LITE) test strip Use as instructed 100 each 1  . Lancets (FREESTYLE) lancets Use as instructed 100 each 1  . metFORMIN (GLUCOPHAGE) 1000 MG tablet Take 1,000 mg by mouth 2 (two) times daily.    . potassium chloride (K-DUR) 10 MEQ tablet Take 1 tablet (10 mEq total) by mouth daily. 90 tablet 3  . sacubitril-valsartan (ENTRESTO) 97-103 MG Take 1 tablet by mouth 2 (two) times daily. 60 tablet 11  . sitaGLIPtin-metformin (JANUMET) 50-1000 MG tablet Take 1 tablet by mouth 2 (two) times daily with a meal.    . spironolactone (ALDACTONE) 25 MG tablet Take 1 tablet (25 mg total) by mouth daily. 90 tablet 3  . torsemide (DEMADEX) 20 MG tablet Take 1 tablet (20 mg total) by mouth daily. 30 tablet 11  . traZODone (DESYREL) 50 MG tablet Take 1 tablet (50 mg total) by mouth at bedtime. 30 tablet 3   Current Facility-Administered Medications  Medication Dose Route Frequency Provider Last Rate Last Admin  . ivabradine (CORLANOR) tablet 5 mg  5 mg Oral BID WC Nahser, Wonda Cheng, MD         Past Medical History:  Diagnosis Date  . Anxiety   . CHF (congestive heart failure)  ([place])   . Diabetes mellitus without complication ([place])   . Hypertension   . Lung cancer, upper lobe ([place]) 09/2015   Non-small cell in right upper lobe treated with RULectomy by VATS at [facility]    ROS:   All systems reviewed and negative except as noted in the HPI.   Past Surgical History:  Procedure Laterality Date  . ABDOMINAL HYSTERECTOMY    . BIV ICD INSERTION CRT-D N/A 12/04/2018   Procedure: BIV ICD INSERTION CRT-D;  Surgeon: Evans Lance, MD;  Location: [place] [facility];  Service: Cardiovascular;  Laterality: N/A;  . BREAST BIOPSY Right 02/10/2018   Biopsy of upper outer quadrant of right breast due to calcifications seen on mammography revealed fat necrosis with calcifications.  Benign so continue annual screening mammograms.  Marland Kitchen BREAST SURGERY    . CESAREAN SECTION    . LOBECTOMY Right 10/06/2015   [place] lung cancer treated with VATS right upper lobectomy by Dr. [name] at [facility].     Family History  Problem Relation Age of Onset  . Diabetes Mother   . Hypertension Mother   . Stroke Father   . Breast cancer Paternal Aunt   . Breast cancer Paternal Aunt   . Breast cancer Paternal Aunt   . Breast cancer Paternal Aunt      Social History  Socioeconomic History  . Marital status: Divorced    Spouse name: Not on file  . Number of children: 2  . Years of education: Not on file  . Highest education level: Not on file  Occupational History  . Occupation: Patient Engineer, manufacturing systems: [employer]  Tobacco Use  . Smoking status: Never Smoker  . Smokeless tobacco: Never Used  Substance and Sexual Activity  . Alcohol use: No  . Drug use: No  . Sexual activity: Yes    Birth control/protection: Surgical  Other Topics Concern  . Not on file  Social History Narrative  . Not on file   Social Determinants of Health   Financial Resource Strain: Low Risk   . Difficulty of Paying Living Expenses: Not hard at all  Food Insecurity: No Food Insecurity  .  Worried About Charity fundraiser in the Last Year: Never true  . Ran Out of Food in the Last Year: Never true  Transportation Needs: No Transportation Needs  . Lack of Transportation (Medical): No  . Lack of Transportation (Non-Medical): No  Physical Activity: Inactive  . Days of Exercise per Week: 0 days  . Minutes of Exercise per Session: 0 min  Stress: Stress Concern Present  . Feeling of Stress : Rather much  Social Connections: Somewhat Isolated  . Frequency of Communication with Friends and Family: Three times a week  . Frequency of Social Gatherings with Friends and Family: Three times a week  . Attends Religious Services: More than 4 times per year  . Active Member of Clubs or Organizations: No  . Attends Archivist Meetings: Never  . Marital Status: Divorced  Human resources officer Violence: Not At Risk  . Fear of Current or Ex-Partner: No  . Emotionally Abused: No  . Physically Abused: No  . Sexually Abused: No     BP (!) 164/98   Pulse 100   Ht 5\' 5"  (1.651 m)   Wt 146 lb (66.2 kg)   SpO2 99%   BMI 24.30 kg/m   Physical Exam:  Well appearing NAD HEENT: Unremarkable Neck:  No JVD, no thyromegally Lymphatics:  No adenopathy Back:  No CVA tenderness Lungs:  Clear with no wheezes HEART:  Regular rate rhythm with a prominent S3.  Abd:  soft, positive bowel sounds, no organomegally, no rebound, no guarding Ext:  2 plus pulses, no edema, no cyanosis, no clubbing Skin:  No rashes no nodules Neuro:  CN II through XII intact, motor grossly intact  EKG - nsr with LBBB  DEVICE  Normal device function.  See PaceArt for details.   Assess/Plan: 1. Chronic systolic heart failure - her symptoms are class 2. I am unclear as to the function of her Left bundle pacing lead.  2. ICD - I have asked her to check a cxr as her Left bundle pacing appears to suggest that she may be capturing the RA.  3. Nausea - this has occurred when she restarted toresemide. I have  discussed with Dr. Acie Fredrickson.   Mikle Bosworth.D.

## 2019-03-12 NOTE — Patient Instructions (Addendum)
Medication Instructions:  Your physician recommends that you continue on your current medications as directed. Please refer to the Current Medication list given to you today.  Labwork: None ordered.  Testing/Procedures: You will get a chest xray  Follow-Up: Your physician wants you to follow-up in: to be determined by results of chest xray with Dr. Lovena Le.   You will receive a reminder letter in the mail two months in advance. If you don't receive a letter, please call our office to schedule the follow-up appointment.  Remote monitoring is used to monitor your ICD from home. This monitoring reduces the number of office visits required to check your device to one time per year. It allows [place] to keep an eye on the functioning of your device to ensure it is working properly. You are scheduled for a device check from home on 06/08/2019. You may send your transmission at any time that day. If you have a wireless device, the transmission will be sent automatically. After your physician reviews your transmission, you will receive a postcard with your next transmission date.  Any Other Special Instructions Will Be Listed Below (If Applicable).  If you need a refill on your cardiac medications before your next appointment, please call your pharmacy.

## 2019-03-13 ENCOUNTER — Telehealth: Payer: Self-pay

## 2019-03-13 DIAGNOSIS — I5022 Chronic systolic (congestive) heart failure: Secondary | ICD-10-CM

## 2019-03-13 DIAGNOSIS — I447 Left bundle-branch block, unspecified: Secondary | ICD-10-CM

## 2019-03-13 NOTE — Telephone Encounter (Signed)
Pt is scheduled for lead revision on 03/26/19 at 10:00 am  Labs/covid test scheduled.  Instruction letter sent via mychart.  Work up complete.

## 2019-03-16 ENCOUNTER — Ambulatory Visit (HOSPITAL_COMMUNITY): Payer: 59 | Admitting: Hematology

## 2019-03-16 ENCOUNTER — Encounter (HOSPITAL_COMMUNITY): Payer: Self-pay

## 2019-03-23 ENCOUNTER — Other Ambulatory Visit (HOSPITAL_COMMUNITY)
Admission: RE | Admit: 2019-03-23 | Discharge: 2019-03-23 | Disposition: A | Discharge status: Home / Self Care | Payer: 59 | Source: Ambulatory Visit | Attending: Internal Medicine | Admitting: Internal Medicine

## 2019-03-23 ENCOUNTER — Other Ambulatory Visit: Payer: 59 | Admitting: *Deleted

## 2019-03-23 ENCOUNTER — Other Ambulatory Visit: Payer: Self-pay

## 2019-03-23 DIAGNOSIS — I447 Left bundle-branch block, unspecified: Secondary | ICD-10-CM

## 2019-03-23 DIAGNOSIS — I5022 Chronic systolic (congestive) heart failure: Secondary | ICD-10-CM

## 2019-03-23 DIAGNOSIS — Z20822 Contact with and (suspected) exposure to covid-19: Secondary | ICD-10-CM | POA: Insufficient documentation

## 2019-03-23 DIAGNOSIS — Z01812 Encounter for preprocedural laboratory examination: Secondary | ICD-10-CM | POA: Diagnosis not present

## 2019-03-23 DIAGNOSIS — I5023 Acute on chronic systolic (congestive) heart failure: Secondary | ICD-10-CM

## 2019-03-23 DIAGNOSIS — Z79899 Other long term (current) drug therapy: Secondary | ICD-10-CM

## 2019-03-23 LAB — BASIC METABOLIC PANEL
BUN/Creatinine Ratio: 14 (ref 9–23)
BUN: 12 mg/dL (ref 6–24)
CO2: 22 mmol/L (ref 20–29)
Calcium: 9.3 mg/dL (ref 8.7–10.2)
Chloride: 99 mmol/L (ref 96–106)
Creatinine, Ser: 0.83 mg/dL (ref 0.57–1.00)
GFR calc Af Amer: 98 mL/min/{1.73_m2} (ref >59)
GFR calc non Af Amer: 85 mL/min/{1.73_m2} (ref >59)
Glucose: 390 mg/dL — ABNORMAL HIGH (ref 65–99)
Potassium: 4.3 mmol/L (ref 3.5–5.2)
Sodium: 136 mmol/L (ref 134–144)

## 2019-03-23 LAB — SARS CORONAVIRUS 2 (TAT 6-24 HRS): SARS Coronavirus 2: NEGATIVE

## 2019-03-23 LAB — CBC WITH DIFFERENTIAL/PLATELET
Basophils Absolute: 0.1 10*3/uL (ref 0.0–0.2)
Basos: 1 %
EOS (ABSOLUTE): 0.2 10*3/uL (ref 0.0–0.4)
Eos: 2 %
Hematocrit: 40.3 % (ref 34.0–46.6)
Hemoglobin: 13.6 g/dL (ref 11.1–15.9)
Immature Grans (Abs): 0 10*3/uL (ref 0.0–0.1)
Immature Granulocytes: 0 %
Lymphocytes Absolute: 3.4 10*3/uL — ABNORMAL HIGH (ref 0.7–3.1)
Lymphs: 35 %
MCH: 28.7 pg (ref 26.6–33.0)
MCHC: 33.7 g/dL (ref 31.5–35.7)
MCV: 85 fL (ref 79–97)
Monocytes Absolute: 0.6 10*3/uL (ref 0.1–0.9)
Monocytes: 6 %
Neutrophils Absolute: 5.4 10*3/uL (ref 1.4–7.0)
Neutrophils: 56 %
Platelets: 280 10*3/uL (ref 150–450)
RBC: 4.74 x10E6/uL (ref 3.77–5.28)
RDW: 12 % (ref 11.7–15.4)
WBC: 9.7 10*3/uL (ref 3.4–10.8)

## 2019-03-25 NOTE — Progress Notes (Signed)
Attempted to call patient regarding procedure for tomorrow.  Left voice mail with the following instructions:  Nothing to eat or drink after midnight, arrival time @ 0800, responsible drive to drive you home and stay overnight with patient.

## 2019-03-26 ENCOUNTER — Ambulatory Visit (HOSPITAL_COMMUNITY)
Admission: RE | Admit: 2019-03-26 | Discharge: 2019-03-26 | Disposition: A | Discharge status: Home / Self Care | Payer: 59 | Attending: Internal Medicine | Admitting: Internal Medicine

## 2019-03-26 ENCOUNTER — Ambulatory Visit (HOSPITAL_COMMUNITY): Payer: 59

## 2019-03-26 ENCOUNTER — Other Ambulatory Visit: Payer: Self-pay

## 2019-03-26 ENCOUNTER — Ambulatory Visit (HOSPITAL_COMMUNITY)
Admission: RE | Disposition: A | Discharge status: Home / Self Care | Payer: Self-pay | Source: Home / Self Care | Attending: Internal Medicine

## 2019-03-26 ENCOUNTER — Encounter (HOSPITAL_COMMUNITY): Payer: Self-pay | Admitting: Certified Registered"

## 2019-03-26 DIAGNOSIS — I428 Other cardiomyopathies: Secondary | ICD-10-CM | POA: Insufficient documentation

## 2019-03-26 DIAGNOSIS — F419 Anxiety disorder, unspecified: Secondary | ICD-10-CM | POA: Diagnosis not present

## 2019-03-26 DIAGNOSIS — T82110A Breakdown (mechanical) of cardiac electrode, initial encounter: Secondary | ICD-10-CM | POA: Diagnosis present

## 2019-03-26 DIAGNOSIS — I110 Hypertensive heart disease with heart failure: Secondary | ICD-10-CM | POA: Insufficient documentation

## 2019-03-26 DIAGNOSIS — I5022 Chronic systolic (congestive) heart failure: Secondary | ICD-10-CM | POA: Insufficient documentation

## 2019-03-26 DIAGNOSIS — R110 Nausea: Secondary | ICD-10-CM | POA: Diagnosis not present

## 2019-03-26 DIAGNOSIS — Z95810 Presence of automatic (implantable) cardiac defibrillator: Secondary | ICD-10-CM | POA: Diagnosis not present

## 2019-03-26 DIAGNOSIS — I447 Left bundle-branch block, unspecified: Secondary | ICD-10-CM | POA: Insufficient documentation

## 2019-03-26 DIAGNOSIS — Z85118 Personal history of other malignant neoplasm of bronchus and lung: Secondary | ICD-10-CM | POA: Insufficient documentation

## 2019-03-26 DIAGNOSIS — T82120A Displacement of cardiac electrode, initial encounter: Secondary | ICD-10-CM | POA: Diagnosis not present

## 2019-03-26 DIAGNOSIS — E119 Type 2 diabetes mellitus without complications: Secondary | ICD-10-CM | POA: Insufficient documentation

## 2019-03-26 DIAGNOSIS — Z79899 Other long term (current) drug therapy: Secondary | ICD-10-CM | POA: Insufficient documentation

## 2019-03-26 DIAGNOSIS — Z7984 Long term (current) use of oral hypoglycemic drugs: Secondary | ICD-10-CM | POA: Insufficient documentation

## 2019-03-26 DIAGNOSIS — T829XXA Unspecified complication of cardiac and vascular prosthetic device, implant and graft, initial encounter: Secondary | ICD-10-CM

## 2019-03-26 HISTORY — PX: LEAD REVISION/REPAIR: EP1213

## 2019-03-26 LAB — GLUCOSE, CAPILLARY: Glucose-Capillary: 274 mg/dL — ABNORMAL HIGH (ref 70–99)

## 2019-03-26 SURGERY — LEAD REVISION/REPAIR

## 2019-03-26 MED ORDER — SODIUM CHLORIDE 0.9 % IV SOLN: INTRAVENOUS | Status: DC

## 2019-03-26 MED ORDER — LIDOCAINE HCL 1 % IJ SOLN
INTRAMUSCULAR | Status: AC
  Filled 2019-03-26: qty 60

## 2019-03-26 MED ORDER — LIDOCAINE HCL (PF) 1 % IJ SOLN
INTRAMUSCULAR | Status: DC | PRN
  Administered 2019-03-26: 11:00:00 50 mL

## 2019-03-26 MED ORDER — CEFAZOLIN SODIUM-DEXTROSE 1-4 GM/50ML-% IV SOLN: 1.0000 g | Freq: Once | INTRAVENOUS | Status: DC

## 2019-03-26 MED ORDER — SODIUM CHLORIDE 0.9 % IV SOLN: 80.0000 mg | INTRAVENOUS | Status: DC

## 2019-03-26 MED ORDER — MIDAZOLAM HCL 5 MG/5ML IJ SOLN
INTRAMUSCULAR | Status: DC | PRN
  Administered 2019-03-26 (×6): 1 mg via INTRAVENOUS

## 2019-03-26 MED ORDER — CHLORHEXIDINE GLUCONATE 4 % EX LIQD: 4.0000 "application " | Freq: Once | CUTANEOUS | Status: DC

## 2019-03-26 MED ORDER — HEPARIN (PORCINE) IN NACL 1000-0.9 UT/500ML-% IV SOLN
INTRAVENOUS | Status: DC | PRN
  Administered 2019-03-26: 500 mL

## 2019-03-26 MED ORDER — MIDAZOLAM HCL 5 MG/5ML IJ SOLN
INTRAMUSCULAR | Status: AC
  Filled 2019-03-26: qty 5

## 2019-03-26 MED ORDER — ACETAMINOPHEN 325 MG PO TABS: 325.0000 mg | ORAL_TABLET | ORAL | Status: DC | PRN

## 2019-03-26 MED ORDER — CEFAZOLIN SODIUM-DEXTROSE 2-4 GM/100ML-% IV SOLN
2.0000 g | INTRAVENOUS | Status: AC
  Administered 2019-03-26: 2 g via INTRAVENOUS

## 2019-03-26 MED ORDER — FENTANYL CITRATE (PF) 100 MCG/2ML IJ SOLN
INTRAMUSCULAR | Status: DC | PRN
  Administered 2019-03-26: 12.5 ug via INTRAVENOUS
  Administered 2019-03-26: 25 ug via INTRAVENOUS
  Administered 2019-03-26: 12.5 ug via INTRAVENOUS
  Administered 2019-03-26: 25 ug via INTRAVENOUS

## 2019-03-26 MED ORDER — CEFAZOLIN SODIUM-DEXTROSE 2-4 GM/100ML-% IV SOLN
INTRAVENOUS | Status: AC
  Filled 2019-03-26: qty 100

## 2019-03-26 MED ORDER — SODIUM CHLORIDE 0.9 % IV SOLN
INTRAVENOUS | Status: AC
  Filled 2019-03-26: qty 2

## 2019-03-26 MED ORDER — HEPARIN (PORCINE) IN NACL 1000-0.9 UT/500ML-% IV SOLN
INTRAVENOUS | Status: AC
  Filled 2019-03-26: qty 500

## 2019-03-26 MED ORDER — ONDANSETRON HCL 4 MG/2ML IJ SOLN: 4.0000 mg | Freq: Four times a day (QID) | INTRAMUSCULAR | Status: DC | PRN

## 2019-03-26 MED ORDER — FENTANYL CITRATE (PF) 100 MCG/2ML IJ SOLN
INTRAMUSCULAR | Status: AC
  Filled 2019-03-26: qty 2

## 2019-03-26 SURGICAL SUPPLY — 10 items
CABLE SURGICAL S-101-97-12 (CABLE) ×3 IMPLANT
CATH RIGHTSITE C315HIS02 (CATHETERS) ×3 IMPLANT
KIT MICROPUNCTURE NIT STIFF (SHEATH) ×3 IMPLANT
LEAD SELECT SECURE 3830 383069 (Lead) ×1 IMPLANT
PAD PRO RADIOLUCENT 2001M-C (PAD) ×3 IMPLANT
SELECT SECURE 3830 383069 (Lead) ×3 IMPLANT
SHEATH 7FR PRELUDE SNAP 13 (SHEATH) ×3 IMPLANT
SLITTER 6232ADJ (MISCELLANEOUS) ×3 IMPLANT
TRAY PACEMAKER INSERTION (PACKS) ×3 IMPLANT
WIRE HI TORQ VERSACORE-J 145CM (WIRE) ×3 IMPLANT

## 2019-03-26 NOTE — Discharge Instructions (Signed)
After Your Lead Revision  . Do not lift your arm above shoulder height for 1 week after your procedure. After 7 days, you may progress as below.     Thursday April 02, 2019  Friday April 03, 2019 Saturday April 04, 2019 Sunday April 05, 2019   . Do not lift, push, pull, or carry anything over 10 pounds with the affected arm until 6 weeks (Thursday May 07, 2019) after your procedure.   . Do not drive until your wound check or until instructed by your healthcare provider that you are safe to do so.   . Monitor your surgical site for redness, swelling, and drainage. Call the device clinic at [phone] if you experience these symptoms or fever/chills.  . If your incision is sealed with Steri-strips or staples. You may shower 7 days after your procedure and wash your incision with soap and water as long as it is healed. If your incision is closed with Dermabond/Surgical glue. You may shower 1 day after your pacemaker implant and wash your incision with soap and water. Avoid lotions, ointments, or perfumes over your incision until it is well-healed.  . You may use a hot tub or a pool AFTER your wound check appointment if the incision is completely closed.   . Your cardiac device may be MRI compatible. We will discuss this at your office visit/Wound check  . Remote monitoring is used to monitor your cardiac device from home. This monitoring is scheduled every 91 days by our office. It allows [place] to keep an eye on the functioning of your device to ensure it is working properly. You will routinely see your Electrophysiologist annually (more often if necessary).   Implantable Cardiac Device Lead Replacement, Care After This sheet gives you information about how to care for yourself after your procedure. Your health care provider may also give you more specific instructions. If you have problems or questions, contact your health care provider. What can I expect after the procedure? After your procedure,  it is common to have:  Mild discomfort at the incision site.  A small amount of drainage or bleeding at the incision site. This is usually no more than a spot. Follow these instructions at home: Incision care         Follow instructions from your health care provider about how to take care of your incision. Make sure you: ? Leave stitches (sutures), skin glue, or adhesive strips in place. These skin closures may need to stay in place for 2 weeks or longer. If adhesive strip edges start to loosen and curl up, you may trim the loose edges. Do not remove adhesive strips completely unless your health care provider tells you to do that.  Check your incision area every day for signs of infection. Check for: ? More redness, swelling, or pain. ? More fluid or blood. ? Warmth. ? Pus or a bad smell. Electric and Quarry manager cell phones should be kept 12 inches (30 cm) away from the cardiac device when they are on. When talking on a cell phone, use the ear on the opposite side of your cardiac device.  Do not place a cell phone in a pocket next to the cardiac device.  Household appliances do not interfere with modern-day cardiac device. Medicines  Take over-the-counter and prescription medicines only as told by your health care provider. General instructions  Do not raise the arm on the side of your procedure higher than your shoulder for  at least 7 days. Except for this restriction, continue to use your arm as normal to prevent problems.  Do not take baths, swim, or use a hot tub until your health care provider says it is okay to do so. You may shower as directed by your health care provider.  Do not lift anything that is heavier than 10 lb (4.5 kg) for 6 weeks or the limit that your health care provider tells you, until he or she says that it is safe.  Return to your normal activities after 2 weeks, or as told by your health care provider. Ask your health care provider what  activities are safe for you.  Keep all follow-up visits as told by your health care provider. This is important. Contact a health care provider if:  You have more redness, swelling, or pain around your incision.  You have more fluid or blood coming from your incision.  Your incision feels warm to the touch.  You have pus or a bad smell coming from your incision.  You have a fever.  The arm or hand on the side of the cardiac device becomes swollen.  The symptoms you had before your procedure are not getting better. Get help right away if:  You develop chest pain.  You feel like you will faint.  You feel light-headed.  You faint. Summary  Check your incision area every day for signs of infection, such as more fluid or blood. A small amount of drainage or bleeding at the incision site is normal.  Do not raise the arm on the side of your procedure higher than your shoulder for at least 5 days, or as long as directed by your health care provider.  Digital cell phones should be kept 12 inches (30 cm) away from the cardiac device when they are on. When talking on a cell phone, use the ear on the opposite side of your cardiac device.  If the symptoms that led to having your lead replaced are not getting better, contact your health care provider. This information is not intended to replace advice given to you by your health care provider. Make sure you discuss any questions you have with your health care provider.

## 2019-03-26 NOTE — Progress Notes (Signed)
Dr. Lovena Le in to see patient. Okay for pt to DC home at 1800.

## 2019-03-26 NOTE — Interval H&P Note (Signed)
History and Physical Interval Note:  03/26/2019 9:41 AM  [facility]  has presented today for surgery, with the diagnosis of Lead malfunction.  The various methods of treatment have been discussed with the patient and family. After consideration of risks, benefits and other options for treatment, the patient has consented to  Procedure(s): LEAD REVISION/REPAIR (N/A) as a surgical intervention.  The patient's history has been reviewed, patient examined, no change in status, stable for surgery.  I have reviewed the patient's chart and labs.  Questions were answered to the patient's satisfaction.     Cristopher Peru

## 2019-03-27 MED FILL — Lidocaine HCl Local Inj 1%: INTRAMUSCULAR | Qty: 60 | Status: AC

## 2019-03-27 MED FILL — Gentamicin Sulfate Inj 40 MG/ML: INTRAMUSCULAR | Qty: 80 | Status: AC

## 2019-03-27 MED FILL — Cefazolin Sodium-Dextrose IV Solution 2 GM/100ML-4%: INTRAVENOUS | Qty: 100 | Status: AC

## 2019-03-30 ENCOUNTER — Telehealth: Payer: Self-pay | Admitting: Family Medicine

## 2019-03-30 NOTE — Telephone Encounter (Signed)
Made pt aware to keep appointment with Dr.Corum.

## 2019-03-30 NOTE — Telephone Encounter (Signed)
Pt was last seen here in August for the past 4-5 months her blood glucose has ranged between 270-390 and in the past week readings have been 279,326 and 348. Pt is taking jardiance and had been on metformin but discontinued it last Thursday due to a surgical procedure she was having. She has not started the metformin back because she states it had not been helping prior to surgery.

## 2019-03-30 NOTE — Telephone Encounter (Signed)
Please advise if she should be seen by you or Dr. Holly Bodily.

## 2019-03-30 NOTE — Telephone Encounter (Signed)
Please advise 

## 2019-03-30 NOTE — Telephone Encounter (Signed)
This patient has not seen [place] since August 2020.  Not even sure if her medications were adjusted by other providers.  If she wants my care, she has to return with her letter and logs to adjust her treatment.  She will also need a repeat A1c in the office.

## 2019-03-30 NOTE — Telephone Encounter (Signed)
Pt called in about her sugar being in the 300 for a week. I put her on the schedule for tomorrow as we do not have an opening today. Please reach out to patient if she needs to be seen at [facility] sooner

## 2019-03-30 NOTE — Telephone Encounter (Signed)
Pt sees Dr. Dorris Fetch for diabetes -perhaps Joy can triage for you

## 2019-03-31 ENCOUNTER — Ambulatory Visit (INDEPENDENT_AMBULATORY_CARE_PROVIDER_SITE_OTHER): Payer: 59 | Admitting: Family Medicine

## 2019-03-31 ENCOUNTER — Encounter: Payer: Self-pay | Admitting: Family Medicine

## 2019-03-31 ENCOUNTER — Other Ambulatory Visit: Payer: Self-pay

## 2019-03-31 VITALS — BP 174/116 | HR 88 | Temp 97.3°F | Ht 65.0 in | Wt 141.8 lb

## 2019-03-31 DIAGNOSIS — E1169 Type 2 diabetes mellitus with other specified complication: Secondary | ICD-10-CM

## 2019-03-31 DIAGNOSIS — I10 Essential (primary) hypertension: Secondary | ICD-10-CM

## 2019-03-31 DIAGNOSIS — Z95810 Presence of automatic (implantable) cardiac defibrillator: Secondary | ICD-10-CM | POA: Diagnosis not present

## 2019-03-31 LAB — POCT GLYCOSYLATED HEMOGLOBIN (HGB A1C): Hemoglobin A1C: 13.6 % — AB (ref 4.0–5.6)

## 2019-03-31 MED ORDER — INSULIN LISPRO (1 UNIT DIAL) 100 UNIT/ML (KWIKPEN): PEN_INJECTOR | SUBCUTANEOUS | 11 refills | Status: DC

## 2019-03-31 MED FILL — HUMALOG 100 UNITS/ML KWIKPE: 100 | 25 days supply | Qty: 9 | Fill #0

## 2019-03-31 NOTE — Patient Instructions (Addendum)
Take blood pressure daily at home-monitoring and take readings   Check glucose before meals-use sliding scale to determine amount of insulin needed. Sliding Scale-glucose readings 150-200-2units, 201-250-4units, 251-300-6units, 301-350-8units, 351-400-10units, 401 and above -12units  Call about CT lung and call for follow up appointment once CT scheduled.

## 2019-03-31 NOTE — Progress Notes (Signed)
Established Patient Office Visit  Subjective:  Patient ID: Crystal Bryant, female    DOB: [birth year]  Age: 49 y.o. MRN: [id number]  CC:  Chief Complaint  Patient presents with  . Diabetes    concerns with elevated sugar levels    HPI [name] presents for  Cardiology-follow up on Tuesday 3/9 for wound check and recheck on lead change-pacemaker Increase walking encouraged-spironolactone/torsemide/coreg/entresto/corlanor-stable-pt has not taken any medication this morning-pt does not take blood pressure readings at home  DM-Have not taken Janumet since last Wed because pt does not feel it is helping and had to be NPO for procedure. Pt states diarrhea and abdominal pain with medication DM.  8/20-right lung cancer follow up-recommended 6 month follow up with CT scan. Pt states she has pushed out CT due to cardiology concerns Past Medical History:  Diagnosis Date  . Anxiety   . CHF (congestive heart failure) ([place])   . Diabetes mellitus without complication ([place])   . Hypertension   . Lung cancer, upper lobe ([place]) 09/2015   Non-small cell in right upper lobe treated with RULectomy by VATS at [facility]    Past Surgical History:  Procedure Laterality Date  . ABDOMINAL HYSTERECTOMY    . BIV ICD INSERTION CRT-D N/A 12/04/2018   Procedure: BIV ICD INSERTION CRT-D;  Surgeon: Evans Lance, MD;  Location: [place] [facility];  Service: Cardiovascular;  Laterality: N/A;  . BREAST BIOPSY Right 02/10/2018   Biopsy of upper outer quadrant of right breast due to calcifications seen on mammography revealed fat necrosis with calcifications.  Benign so continue annual screening mammograms.  Marland Kitchen BREAST SURGERY    . CESAREAN SECTION    . LEAD REVISION/REPAIR N/A 03/26/2019   Procedure: LEAD REVISION/REPAIR;  Surgeon: Evans Lance, MD;  Location: [place] [facility];  Service: Cardiovascular;  Laterality: N/A;  . LOBECTOMY Right 10/06/2015   [place] lung cancer treated with VATS right upper  lobectomy by Dr. [name] at [facility].    Family History  Problem Relation Age of Onset  . Diabetes Mother   . Hypertension Mother   . Stroke Father   . Breast cancer Paternal Aunt   . Breast cancer Paternal Aunt   . Breast cancer Paternal Aunt   . Breast cancer Paternal Aunt     Social History   Socioeconomic History  . Marital status: Divorced    Spouse name: Not on file  . Number of children: 2  . Years of education: Not on file  . Highest education level: Not on file  Occupational History  . Occupation: Patient Engineer, manufacturing systems: [employer]  Tobacco Use  . Smoking status: Never Smoker  . Smokeless tobacco: Never Used  Substance and Sexual Activity  . Alcohol use: No  . Drug use: No  . Sexual activity: Yes    Birth control/protection: Surgical  Other Topics Concern  . Not on file  Social History Narrative  . Not on file   Social Determinants of Health   Financial Resource Strain: Low Risk   . Difficulty of Paying Living Expenses: Not hard at all  Food Insecurity: No Food Insecurity  . Worried About Charity fundraiser in the Last Year: Never true  . Ran Out of Food in the Last Year: Never true  Transportation Needs: No Transportation Needs  . Lack of Transportation (Medical): No  . Lack of Transportation (Non-Medical): No  Physical Activity: Inactive  . Days of Exercise per Week: 0  days  . Minutes of Exercise per Session: 0 min  Stress: Stress Concern Present  . Feeling of Stress : Rather much  Social Connections: Somewhat Isolated  . Frequency of Communication with Friends and Family: Three times a week  . Frequency of Social Gatherings with Friends and Family: Three times a week  . Attends Religious Services: More than 4 times per year  . Active Member of Clubs or Organizations: No  . Attends Archivist Meetings: Never  . Marital Status: Divorced  Human resources officer Violence: Not At Risk  . Fear of Current or Ex-Partner: No  .  Emotionally Abused: No  . Physically Abused: No  . Sexually Abused: No    Outpatient Medications Prior to Visit  Medication Sig Dispense Refill  . Blood Glucose Monitoring Suppl (FREESTYLE LITE) DEVI Use As Directed 1 Device 0  . carvedilol (COREG) 25 MG tablet Take 2 tablets (50 mg total) by mouth 2 (two) times daily with a meal. 360 tablet 2  . empagliflozin (JARDIANCE) 10 MG TABS tablet Take 10 mg by mouth daily before breakfast. 30 tablet 11  . glucose blood (FREESTYLE LITE) test strip Use as instructed 100 each 1  . Lancets (FREESTYLE) lancets Use as instructed 100 each 1  . potassium chloride (K-DUR) 10 MEQ tablet Take 1 tablet (10 mEq total) by mouth daily. 90 tablet 3  . sacubitril-valsartan (ENTRESTO) 97-103 MG Take 1 tablet by mouth 2 (two) times daily. 60 tablet 11  . spironolactone (ALDACTONE) 25 MG tablet Take 1 tablet (25 mg total) by mouth daily. 90 tablet 3  . torsemide (DEMADEX) 20 MG tablet Take 1 tablet (20 mg total) by mouth daily. 30 tablet 11  . traZODone (DESYREL) 50 MG tablet Take 1 tablet (50 mg total) by mouth at bedtime. 30 tablet 3  . metFORMIN (GLUCOPHAGE) 1000 MG tablet Take 1,000 mg by mouth 2 (two) times daily.    . sitaGLIPtin-metformin (JANUMET) 50-1000 MG tablet Take 1 tablet by mouth 2 (two) times daily with a meal.     Facility-Administered Medications Prior to Visit  Medication Dose Route Frequency Provider Last Rate Last Admin  . ivabradine (CORLANOR) tablet 5 mg  5 mg Oral BID WC Nahser, Wonda Cheng, MD        No Known Allergies  ROS Review of Systems  Constitutional: Negative.   HENT: Negative.   Eyes:       No recent eye exam  Respiratory:       Lung cancer  Cardiovascular:       Biventricular ICD-lead replaced 2 weeks ago  Gastrointestinal: Positive for diarrhea and nausea.  Endocrine:       DM-oral medications in the past-distant past insulin  Genitourinary: Negative.   Musculoskeletal: Negative.   Skin: Negative.    Allergic/Immunologic: Negative.   Neurological: Negative.   Psychiatric/Behavioral: Positive for dysphoric mood.      Objective:    Physical Exam  Constitutional: She is oriented to person, place, and time. She appears well-developed and well-nourished.  Eyes: Conjunctivae are normal.  Cardiovascular: Normal rate and regular rhythm.  Pulmonary/Chest: Effort normal and breath sounds normal. No respiratory distress.  Neurological: She is alert and oriented to person, place, and time.  Psychiatric: She has a normal mood and affect. Her behavior is normal.    BP (!) 174/116   Pulse 88   Temp (!) 97.3 F (36.3 C) (Temporal)   Ht 5\' 5"  (1.651 m)   Wt 141 lb 12.8 oz (  64.3 kg)   SpO2 99%   BMI 23.60 kg/m  Wt Readings from Last 3 Encounters:  03/31/19 141 lb 12.8 oz (64.3 kg)  03/26/19 140 lb (63.5 kg)  03/12/19 146 lb (66.2 kg)     Health Maintenance Due  Topic Date Due  . PNEUMOCOCCAL POLYSACCHARIDE VACCINE AGE 38-64 HIGH RISK  09/08/1974  . TETANUS/TDAP  09/08/1991  . OPHTHALMOLOGY EXAM  09/11/2017  . URINE MICROALBUMIN  12/15/2017  . FOOT EXAM  01/05/2018  . HEMOGLOBIN A1C  01/09/2019     Lab Results  Component Value Date   TSH 0.455 06/04/2017   Lab Results  Component Value Date   WBC 9.7 03/23/2019   HGB 13.6 03/23/2019   HCT 40.3 03/23/2019   MCV 85 03/23/2019   PLT 280 03/23/2019   Lab Results  Component Value Date   NA 136 03/23/2019   K 4.3 03/23/2019   CO2 22 03/23/2019   GLUCOSE 390 (H) 03/23/2019   BUN 12 03/23/2019   CREATININE 0.83 03/23/2019   BILITOT 0.3 10/03/2018   ALKPHOS 93 10/03/2018   AST 17 10/03/2018   ALT 15 10/03/2018   PROT 7.4 10/03/2018   ALBUMIN 3.8 10/03/2018   CALCIUM 9.3 03/23/2019   ANIONGAP 9 11/18/2018   Lab Results  Component Value Date   CHOL 278 (H) 12/15/2016   Lab Results  Component Value Date   HDL 45 12/15/2016   Lab Results  Component Value Date   LDLCALC 197 (H) 12/15/2016   Lab Results   Component Value Date   TRIG 171 (H) 07/09/2018   Lab Results  Component Value Date   CHOLHDL 6.2 (H) 12/15/2016   Lab Results  Component Value Date   HGBA1C 14.1 (H) 07/10/2018      Assessment & Plan:  1. Essential hypertension, benign-take all medications as directed by cardiology-write down blood pressure readings -early morning and evening and take to follow up appointment next week to establish baseline and goals  2. Type 2 diabetes mellitus with other specified complication, unspecified whether long term insulin use (HCC) STOP METFORMIN/JANUMET-start sliding scale insulin based on checking glucose before meals-return to clinic in 2 weeks to establish basal insulin for night use  Renal function -normal, glucose 390, A1c 13.6 3. Biventricular ICD (implantable cardioverter-defibrillator) in place Per cardiology-replaced lead 2 weeks ago-f/u next week 40 minutes discussing past medication history since last visit, need to check glucose readings, importance of good control on glucose with other health concerns, assessment, plan Laddie Naeem Hannah Beat, MD

## 2019-04-02 ENCOUNTER — Telehealth: Payer: Self-pay | Admitting: Family Medicine

## 2019-04-02 NOTE — Telephone Encounter (Signed)
Patient is calling and states that insulin was called in but needles were not.   [place], [place] - [address]. Phone:  [phone]  Fax:  [phone]

## 2019-04-03 ENCOUNTER — Other Ambulatory Visit: Payer: Self-pay | Admitting: Emergency Medicine

## 2019-04-03 MED FILL — UNIFINE PENTIPS 6MM 31G: 31G X 6 MM | 30 days supply | Qty: 100 | Fill #0

## 2019-04-03 NOTE — Telephone Encounter (Signed)
Insulin needles for Crystal Bryant has been called into the pharmacy with 6 refills. Patient check insulin 3-5 times a day.

## 2019-04-07 ENCOUNTER — Ambulatory Visit (INDEPENDENT_AMBULATORY_CARE_PROVIDER_SITE_OTHER): Payer: 59 | Admitting: *Deleted

## 2019-04-07 ENCOUNTER — Other Ambulatory Visit: Payer: Self-pay

## 2019-04-07 DIAGNOSIS — I5022 Chronic systolic (congestive) heart failure: Secondary | ICD-10-CM

## 2019-04-07 LAB — CUP PACEART INCLINIC DEVICE CHECK
Battery Remaining Longevity: 50 mo
Brady Statistic RA Percent Paced: 0 %
Brady Statistic RV Percent Paced: 99.85 %
Date Time Interrogation Session: 20210309090635
HighPow Impedance: 51.75 Ohm
Implantable Lead Implant Date: 20201105
Implantable Lead Implant Date: 20201105
Implantable Lead Implant Date: 20210225
Implantable Lead Location: 753858
Implantable Lead Location: 753859
Implantable Lead Location: 753860
Implantable Lead Model: 3830
Implantable Pulse Generator Implant Date: 20201105
Lead Channel Impedance Value: 350 Ohm
Lead Channel Impedance Value: 362.5 Ohm
Lead Channel Impedance Value: 425 Ohm
Lead Channel Pacing Threshold Amplitude: 0.5 V
Lead Channel Pacing Threshold Amplitude: 0.5 V
Lead Channel Pacing Threshold Amplitude: 0.75 V
Lead Channel Pacing Threshold Pulse Width: 0.05 ms
Lead Channel Pacing Threshold Pulse Width: 0.5 ms
Lead Channel Pacing Threshold Pulse Width: 0.5 ms
Lead Channel Sensing Intrinsic Amplitude: 4 mV
Lead Channel Sensing Intrinsic Amplitude: 6.5 mV
Lead Channel Setting Pacing Amplitude: 0.25 V
Lead Channel Setting Pacing Amplitude: 2 V
Lead Channel Setting Pacing Amplitude: 3.5 V
Lead Channel Setting Pacing Pulse Width: 0.05 ms
Lead Channel Setting Pacing Pulse Width: 0.5 ms
Lead Channel Setting Sensing Sensitivity: 0.5 mV
Pulse Gen Serial Number: 9849338

## 2019-04-07 NOTE — Patient Instructions (Signed)
Call office if you have any redness, drainage, or swelling at wound.

## 2019-04-07 NOTE — Progress Notes (Signed)
CRT-D device check and wound check in office. Thresholds and sensing consistent with previous device measurements. Lead impedance trends stable over time. No mode switch episodes recorded. No ventricular arrhythmia episodes recorded. Patient bi-ventricularly pacing 99.5% of the time. Device programmed with appropriate safety margins. Heart failure diagnostics reviewed and trends are stable for patient. No changes made this session. Estimated longevity 4 yrs 2 months.  Patient enrolled in remote follow up. Plan to check device remotely and next remote scheduled for 06/08/19. Patient education completed including shock plan, wound care and lifting restrictions. Follow up with Dr Lovena Le 07/01/19.

## 2019-04-22 ENCOUNTER — Encounter: Payer: Self-pay | Admitting: Family Medicine

## 2019-05-14 MED FILL — HUMALOG 100 UNITS/ML KWIKPE: 100 | 25 days supply | Qty: 9 | Fill #1

## 2019-05-26 MED FILL — FREESTYLE LITE TEST STRIP: 25 days supply | Qty: 100 | Fill #1

## 2019-05-26 MED FILL — TORSEMIDE 20 MG TABLET: 20 | 30 days supply | Qty: 30 | Fill #3

## 2019-05-28 NOTE — Telephone Encounter (Signed)
No action needed

## 2019-06-01 ENCOUNTER — Ambulatory Visit: Payer: 59 | Admitting: Family Medicine

## 2019-06-05 MED FILL — TORSEMIDE 20 MG TABLET: 20 | 30 days supply | Qty: 30 | Fill #3

## 2019-06-08 ENCOUNTER — Ambulatory Visit (INDEPENDENT_AMBULATORY_CARE_PROVIDER_SITE_OTHER): Payer: 59 | Admitting: *Deleted

## 2019-06-08 DIAGNOSIS — I447 Left bundle-branch block, unspecified: Secondary | ICD-10-CM

## 2019-06-09 ENCOUNTER — Other Ambulatory Visit: Payer: Self-pay | Admitting: Internal Medicine

## 2019-06-09 LAB — CUP PACEART REMOTE DEVICE CHECK
Battery Remaining Longevity: 52 mo
Battery Remaining Percentage: 83 %
Battery Voltage: 2.99 V
Brady Statistic AP VP Percent: 1 %
Brady Statistic AP VS Percent: 1 %
Brady Statistic AS VP Percent: 99 %
Brady Statistic AS VS Percent: 1 %
Brady Statistic RA Percent Paced: 1 %
Date Time Interrogation Session: 20210511020014
HighPow Impedance: 64 Ohm
HighPow Impedance: 64 Ohm
Implantable Lead Implant Date: 20201105
Implantable Lead Implant Date: 20201105
Implantable Lead Implant Date: 20210225
Implantable Lead Location: 753858
Implantable Lead Location: 753859
Implantable Lead Location: 753860
Implantable Lead Model: 3830
Implantable Pulse Generator Implant Date: 20201105
Lead Channel Impedance Value: 330 Ohm
Lead Channel Impedance Value: 400 Ohm
Lead Channel Impedance Value: 400 Ohm
Lead Channel Pacing Threshold Amplitude: 0.5 V
Lead Channel Pacing Threshold Amplitude: 0.5 V
Lead Channel Pacing Threshold Amplitude: 0.75 V
Lead Channel Pacing Threshold Pulse Width: 0.05 ms
Lead Channel Pacing Threshold Pulse Width: 0.5 ms
Lead Channel Pacing Threshold Pulse Width: 0.5 ms
Lead Channel Sensing Intrinsic Amplitude: 1 mV
Lead Channel Sensing Intrinsic Amplitude: 6.5 mV
Lead Channel Setting Pacing Amplitude: 0.25 V
Lead Channel Setting Pacing Amplitude: 2 V
Lead Channel Setting Pacing Amplitude: 3.5 V
Lead Channel Setting Pacing Pulse Width: 0.05 ms
Lead Channel Setting Pacing Pulse Width: 0.5 ms
Lead Channel Setting Sensing Sensitivity: 0.5 mV
Pulse Gen Serial Number: 9849338

## 2019-06-10 NOTE — Progress Notes (Signed)
Remote ICD transmission.   

## 2019-06-17 ENCOUNTER — Telehealth: Payer: Self-pay

## 2019-06-17 NOTE — Telephone Encounter (Signed)
The pt wants to reschedule her appointment with Dr. Lovena Le on June 2nd. I told her I will have [place] to give her a call back.

## 2019-06-24 MED FILL — HUMALOG 100 UNITS/ML KWIKPE: 100 | 25 days supply | Qty: 9 | Fill #2

## 2019-07-01 ENCOUNTER — Encounter: Payer: Self-pay | Admitting: Internal Medicine

## 2019-07-01 ENCOUNTER — Ambulatory Visit: Payer: 59 | Admitting: Family Medicine

## 2019-07-01 ENCOUNTER — Other Ambulatory Visit: Payer: Self-pay

## 2019-07-01 ENCOUNTER — Ambulatory Visit (INDEPENDENT_AMBULATORY_CARE_PROVIDER_SITE_OTHER): Payer: 59 | Admitting: Internal Medicine

## 2019-07-01 VITALS — BP 126/74 | HR 90 | Ht 65.0 in | Wt 153.8 lb

## 2019-07-01 DIAGNOSIS — I5022 Chronic systolic (congestive) heart failure: Secondary | ICD-10-CM

## 2019-07-01 DIAGNOSIS — Z95810 Presence of automatic (implantable) cardiac defibrillator: Secondary | ICD-10-CM

## 2019-07-01 DIAGNOSIS — I447 Left bundle-branch block, unspecified: Secondary | ICD-10-CM | POA: Diagnosis not present

## 2019-07-01 NOTE — Patient Instructions (Signed)
Medication Instructions:  Your physician recommends that you continue on your current medications as directed. Please refer to the Current Medication list given to you today.  Labwork: None ordered.  Testing/Procedures: None ordered.  Follow-Up: Your physician wants you to follow-up in: one year with Dr. Lovena Le.   You will receive a reminder letter in the mail two months in advance. If you don't receive a letter, please call our office to schedule the follow-up appointment.  Remote monitoring is used to monitor your ICD from home. This monitoring reduces the number of office visits required to check your device to one time per year. It allows [place] to keep an eye on the functioning of your device to ensure it is working properly. You are scheduled for a device check from home on 09/07/2019. You may send your transmission at any time that day. If you have a wireless device, the transmission will be sent automatically. After your physician reviews your transmission, you will receive a postcard with your next transmission date.  Any Other Special Instructions Will Be Listed Below (If Applicable).  If you need a refill on your cardiac medications before your next appointment, please call your pharmacy.

## 2019-07-01 NOTE — Progress Notes (Signed)
HPI Crystal Bryant returns today for followup. SHe has a non-ischemic CM, chronic systolic heart failure and LBBB and on maximal medical therapy. She had dislodgement of her LV lead and underwent revision with a left bundle area pacing lead. She admits to some dietary indiscretion but also notes that her appetite has been down. She had some peripheral edema last week. Better this week. She denies medical non-compliance. No ICD therapies. No Known Allergies   Current Outpatient Medications  Medication Sig Dispense Refill  . Blood Glucose Monitoring Suppl (FREESTYLE LITE) DEVI Use As Directed 1 Device 0  . carvedilol (COREG) 25 MG tablet Take 2 tablets (50 mg total) by mouth 2 (two) times daily with a meal. 360 tablet 2  . empagliflozin (JARDIANCE) 10 MG TABS tablet Take 10 mg by mouth daily before breakfast. 30 tablet 11  . glucose blood (FREESTYLE LITE) test strip Use as instructed 100 each 1  . insulin lispro (HUMALOG KWIKPEN) 100 UNIT/ML KwikPen Administer insulin based on glucose readings-sliding scale 150-200-2units SQ, 201-250-4units SQ, 251-300-6units SQ, 301-350-8units SQ, 351-400-10units SQ, 401 and greater is 12units SQ 15 mL 11  . Lancets (FREESTYLE) lancets Use as instructed 100 each 1  . potassium chloride (K-DUR) 10 MEQ tablet Take 1 tablet (10 mEq total) by mouth daily. 90 tablet 3  . sacubitril-valsartan (ENTRESTO) 97-103 MG Take 1 tablet by mouth 2 (two) times daily. 60 tablet 11  . spironolactone (ALDACTONE) 25 MG tablet Take 1 tablet (25 mg total) by mouth daily. 90 tablet 3  . torsemide (DEMADEX) 20 MG tablet Take 1 tablet (20 mg total) by mouth daily. 30 tablet 11  . traZODone (DESYREL) 50 MG tablet Take 1 tablet (50 mg total) by mouth at bedtime. 30 tablet 3   Current Facility-Administered Medications  Medication Dose Route Frequency Provider Last Rate Last Admin  . ivabradine (CORLANOR) tablet 5 mg  5 mg Oral BID WC Nahser, Wonda Cheng, MD         Past Medical  History:  Diagnosis Date  . Anxiety   . CHF (congestive heart failure) ([place])   . Diabetes mellitus without complication ([place])   . Hypertension   . Lung cancer, upper lobe ([place]) 09/2015   Non-small cell in right upper lobe treated with RULectomy by VATS at [facility]    ROS:   All systems reviewed and negative except as noted in the HPI.   Past Surgical History:  Procedure Laterality Date  . ABDOMINAL HYSTERECTOMY    . BIV ICD INSERTION CRT-D N/A 12/04/2018   Procedure: BIV ICD INSERTION CRT-D;  Surgeon: Evans Lance, MD;  Location: [place] [facility];  Service: Cardiovascular;  Laterality: N/A;  . BREAST BIOPSY Right 02/10/2018   Biopsy of upper outer quadrant of right breast due to calcifications seen on mammography revealed fat necrosis with calcifications.  Benign so continue annual screening mammograms.  Marland Kitchen BREAST SURGERY    . CESAREAN SECTION    . LEAD REVISION/REPAIR N/A 03/26/2019   Procedure: LEAD REVISION/REPAIR;  Surgeon: Evans Lance, MD;  Location: [place] [facility];  Service: Cardiovascular;  Laterality: N/A;  . LOBECTOMY Right 10/06/2015   [place] lung cancer treated with VATS right upper lobectomy by Dr. [name] at [facility].     Family History  Problem Relation Age of Onset  . Diabetes Mother   . Hypertension Mother   . Stroke Father   . Breast cancer Paternal Aunt   . Breast cancer Paternal Aunt   .  Breast cancer Paternal Aunt   . Breast cancer Paternal Aunt      Social History   Socioeconomic History  . Marital status: Divorced    Spouse name: Not on file  . Number of children: 2  . Years of education: Not on file  . Highest education level: Not on file  Occupational History  . Occupation: Patient Engineer, manufacturing systems: [employer]  Tobacco Use  . Smoking status: Never Smoker  . Smokeless tobacco: Never Used  Substance and Sexual Activity  . Alcohol use: No  . Drug use: No  . Sexual activity: Yes    Birth control/protection: Surgical    Other Topics Concern  . Not on file  Social History Narrative  . Not on file   Social Determinants of Health   Financial Resource Strain: Low Risk   . Difficulty of Paying Living Expenses: Not hard at all  Food Insecurity: No Food Insecurity  . Worried About Charity fundraiser in the Last Year: Never true  . Ran Out of Food in the Last Year: Never true  Transportation Needs: No Transportation Needs  . Lack of Transportation (Medical): No  . Lack of Transportation (Non-Medical): No  Physical Activity: Inactive  . Days of Exercise per Week: 0 days  . Minutes of Exercise per Session: 0 min  Stress: Stress Concern Present  . Feeling of Stress : Rather much  Social Connections: Somewhat Isolated  . Frequency of Communication with Friends and Family: Three times a week  . Frequency of Social Gatherings with Friends and Family: Three times a week  . Attends Religious Services: More than 4 times per year  . Active Member of Clubs or Organizations: No  . Attends Archivist Meetings: Never  . Marital Status: Divorced  Human resources officer Violence: Not At Risk  . Fear of Current or Ex-Partner: No  . Emotionally Abused: No  . Physically Abused: No  . Sexually Abused: No     BP 126/74   Pulse 90   Ht 5\' 5"  (1.651 m)   Wt 153 lb 12.8 oz (69.8 kg)   SpO2 98%   BMI 25.59 kg/m   Physical Exam:  Well appearing NAD HEENT: Unremarkable Neck:  No JVD, no thyromegally Lymphatics:  No adenopathy Back:  No CVA tenderness Lungs:  Clear with no wheezes HEART:  Regular rate rhythm, no murmurs, no rubs, no clicks Abd:  soft, positive bowel sounds, no organomegally, no rebound, no guarding Ext:  2 plus pulses, no edema, no cyanosis, no clubbing Skin:  No rashes no nodules Neuro:  CN II through XII intact, motor grossly intact  EKG - nsr with ventricular pacing. QRS 120.   DEVICE  Normal device function.  See PaceArt for details.   Assess/Plan: 1. Chronic systolic heart  failure -her symptoms are up and down. She has evidence of fluid overload and then improment on her fluid monitor on her device. I have asked her to take an additional 10 mg of demadex for 30 mg daily. She is encouraged to avoid salty foods. She might ultimately require a visit to the CHF clinic although Dr. Acie Fredrickson has done a nice job with maximizing her CHF meds. 2. HTN - her bp is well controlled today.   Mikle Bosworth.D.

## 2019-07-08 LAB — CUP PACEART INCLINIC DEVICE CHECK
Date Time Interrogation Session: 20210602125448
Implantable Lead Implant Date: 20201105
Implantable Lead Implant Date: 20201105
Implantable Lead Implant Date: 20210225
Implantable Lead Location: 753858
Implantable Lead Location: 753859
Implantable Lead Location: 753860
Implantable Lead Model: 3830
Implantable Pulse Generator Implant Date: 20201105
Lead Channel Pacing Threshold Amplitude: 0.5 V
Lead Channel Pacing Threshold Amplitude: 0.75 V
Lead Channel Pacing Threshold Pulse Width: 0.5 ms
Lead Channel Pacing Threshold Pulse Width: 0.5 ms
Lead Channel Sensing Intrinsic Amplitude: 1 mV
Lead Channel Sensing Intrinsic Amplitude: 8.4 mV
Pulse Gen Serial Number: 9849338

## 2019-07-22 ENCOUNTER — Encounter: Payer: Self-pay | Admitting: Family Medicine

## 2019-07-22 ENCOUNTER — Encounter: Payer: Self-pay | Admitting: Gastroenterology

## 2019-07-22 ENCOUNTER — Ambulatory Visit: Payer: 59 | Admitting: Cardiovascular Disease

## 2019-07-22 ENCOUNTER — Ambulatory Visit: Payer: 59 | Admitting: Family Medicine

## 2019-07-22 ENCOUNTER — Other Ambulatory Visit: Payer: Self-pay

## 2019-07-22 VITALS — BP 120/80 | HR 81 | Temp 96.0°F | Ht 65.0 in | Wt 153.2 lb

## 2019-07-22 DIAGNOSIS — R1314 Dysphagia, pharyngoesophageal phase: Secondary | ICD-10-CM | POA: Insufficient documentation

## 2019-07-22 DIAGNOSIS — F411 Generalized anxiety disorder: Secondary | ICD-10-CM | POA: Diagnosis not present

## 2019-07-22 DIAGNOSIS — E1165 Type 2 diabetes mellitus with hyperglycemia: Secondary | ICD-10-CM | POA: Diagnosis not present

## 2019-07-22 DIAGNOSIS — Z794 Long term (current) use of insulin: Secondary | ICD-10-CM | POA: Diagnosis not present

## 2019-07-22 DIAGNOSIS — I5022 Chronic systolic (congestive) heart failure: Secondary | ICD-10-CM

## 2019-07-22 MED ORDER — POTASSIUM CHLORIDE 20 MEQ/15ML (10%) PO SOLN: 10.0000 meq | Freq: Every day | ORAL | 2 refills | Status: DC

## 2019-07-22 MED ORDER — SERTRALINE HCL 50 MG PO TABS: 50.0000 mg | ORAL_TABLET | Freq: Every day | ORAL | 3 refills | Status: DC

## 2019-07-22 MED ORDER — METFORMIN HCL 500 MG PO TABS: ORAL_TABLET | ORAL | 1 refills | Status: DC

## 2019-07-22 MED FILL — POTASSIUM CL 10% (20 MEQ/15: 20 MEQ/15ML | 63 days supply | Qty: 473 | Fill #0

## 2019-07-22 MED FILL — SERTRALINE HCL 50 MG TABLET: 50 | 30 days supply | Qty: 30 | Fill #0

## 2019-07-22 MED FILL — METFORMIN HCL 500 MG TABS: 500 | 30 days supply | Qty: 120 | Fill #0

## 2019-07-22 NOTE — Progress Notes (Signed)
Chief Complaint  Patient presents with  . New Patient (Initial Visit)       New Patient Visit SUBJECTIVE: HPI: Crystal Bryant is an 49 y.o.female who is being seen for establishing care.  1 yr of anxiety.  She is a longstanding history of this but it got worse after she contracted Covid in June 2020.  Not on any medication. +famhx. Does some walking. No SI or HI. No self medication. Tried trazodone in past that did not help. She is following with a psychologist.   Patient has a 33-month history of difficulty swallowing.  She feels it in her upper chest/lower neck area.  It is mainly with solids.  She is not losing weight or regurgitating.  There is no pain.  There is no injury.  She feels no masses or bulges in her neck.  Patient is on torsemide for CHF for which she follows with cardiology.  Due to her swelling issue, she is requesting a liquid version of potassium.  The patient has a history of uncontrolled diabetes.  Her previous provider took her off of Metformin and Januvia and placed her on insulin.  Her last A1c was greater than 12.  She is requesting a referral to the endocrinology team.  She is type II and tolerated the Janumet well.  Past Medical History:  Diagnosis Date  . Anxiety   . CHF (congestive heart failure) ([place])   . Diabetes mellitus without complication ([place])   . Hypertension   . Lung cancer, upper lobe ([place]) 09/2015   Non-small cell in right upper lobe treated with RULectomy by VATS at [facility]   Past Surgical History:  Procedure Laterality Date  . ABDOMINAL HYSTERECTOMY    . BIV ICD INSERTION CRT-D N/A 12/04/2018   Procedure: BIV ICD INSERTION CRT-D;  Surgeon: Evans Lance, MD;  Location: [place] [facility];  Service: Cardiovascular;  Laterality: N/A;  . BREAST BIOPSY Right 02/10/2018   Biopsy of upper outer quadrant of right breast due to calcifications seen on mammography revealed fat necrosis with calcifications.  Benign so continue annual screening  mammograms.  Marland Kitchen BREAST SURGERY    . CESAREAN SECTION    . LEAD REVISION/REPAIR N/A 03/26/2019   Procedure: LEAD REVISION/REPAIR;  Surgeon: Evans Lance, MD;  Location: [place] [facility];  Service: Cardiovascular;  Laterality: N/A;  . LOBECTOMY Right 10/06/2015   [place] lung cancer treated with VATS right upper lobectomy by Dr. [name] at [facility]   Family History  Problem Relation Age of Onset  . Diabetes Mother   . Hypertension Mother   . Stroke Father   . Breast cancer Paternal Aunt   . Breast cancer Paternal Aunt   . Breast cancer Paternal Aunt   . Breast cancer Paternal Aunt    No Known Allergies  Current Outpatient Medications:  .  Blood Glucose Monitoring Suppl (FREESTYLE LITE) DEVI, Use As Directed, Disp: 1 Device, Rfl: 0 .  carvedilol (COREG) 25 MG tablet, Take 2 tablets (50 mg total) by mouth 2 (two) times daily with a meal., Disp: 360 tablet, Rfl: 2 .  glucose blood (FREESTYLE LITE) test strip, Use as instructed, Disp: 100 each, Rfl: 1 .  insulin lispro (HUMALOG KWIKPEN) 100 UNIT/ML KwikPen, Administer insulin based on glucose readings-sliding scale 150-200-2units SQ, 201-250-4units SQ, 251-300-6units SQ, 301-350-8units SQ, 351-400-10units SQ, 401 and greater is 12units SQ, Disp: 15 mL, Rfl: 11 .  Lancets (FREESTYLE) lancets, Use as instructed, Disp: 100 each, Rfl: 1 .  sacubitril-valsartan (ENTRESTO)  97-103 MG, Take 1 tablet by mouth 2 (two) times daily., Disp: 60 tablet, Rfl: 11 .  torsemide (DEMADEX) 20 MG tablet, Take 1 tablet (20 mg total) by mouth daily., Disp: 30 tablet, Rfl: 11 .  metFORMIN (GLUCOPHAGE) 500 MG tablet, Week 1: 1 tab daily Week 2: 1 tab twice daily Week 3: 2 tabs in AM, 1 in evening Week 4: 2 tabs twice daily, Disp: 120 tablet, Rfl: 1 .  potassium chloride 20 MEQ/15ML (10%) SOLN, Take 7.5 mLs (10 mEq total) by mouth daily., Disp: 473 mL, Rfl: 2 .  sertraline (ZOLOFT) 50 MG tablet, Take 1 tablet (50 mg total) by mouth daily. Take 1/2 tab daily for first 2  weeks., Disp: 30 tablet, Rfl: 3  Current Facility-Administered Medications:  .  ivabradine (CORLANOR) tablet 5 mg, 5 mg, Oral, BID WC, Nahser, Wonda Cheng, MD  OBJECTIVE: BP 120/80 (BP Location: Left Arm, Patient Position: Sitting, Cuff Size: Normal)   Pulse 81   Temp (!) 96 F (35.6 C) (Temporal)   Ht 5\' 5"  (1.651 m)   Wt 153 lb 4 oz (69.5 kg)   SpO2 100%   BMI 25.50 kg/m  General:  well developed, well nourished, in no apparent distress Skin:  no significant moles, warts, or growths Neck: Symmetric, soft, no masses or thyromegaly noted Throat/Pharynx:  lips and gingiva without lesion; tongue and uvula midline; non-inflamed pharynx; no exudates or postnasal drainage Lungs:  clear to auscultation, breath sounds equal bilaterally, no respiratory distress Cardio:  regular rate and rhythm, no LE edema or bruits Neuro:  gait normal Psych: well oriented with normal range of affect and appropriate judgment/insight  ASSESSMENT/PLAN: GAD (generalized anxiety disorder) - Plan: sertraline (ZOLOFT) 50 MG tablet  Pharyngoesophageal dysphagia - Plan: Ambulatory referral to Gastroenterology  Chronic systolic heart failure ([place]) - Plan: potassium chloride 20 MEQ/15ML (10%) SOLN  Type 2 diabetes mellitus with hyperglycemia, with long-term current use of insulin ([place]) - Plan: Ambulatory referral to Endocrinology, metFORMIN (GLUCOPHAGE) 500 MG tablet  1-start Zoloft 25 mg daily for 2 weeks and then increase to 50 mg daily.  Continue with counseling team.  Counseled on exercise.  Anxiety coping techniques also provided. 2-refer to gastroenterology 3-change Klor-Con to liquid version 4-add back Metformin, continue current insulin, refer to endocrinology. Patient should return in 6 months to recheck #1. The patient voiced understanding and agreement to the plan.   Dewy Rose, DO 07/22/19  10:16 AM

## 2019-07-22 NOTE — Patient Instructions (Addendum)
Please consider counseling. Contact [phone] to schedule an appointment or inquire about cost/insurance coverage.  Aim to do some physical exertion for 150 minutes per week. This is typically divided into 5 days per week, 30 minutes per day. The activity should be enough to get your heart rate up. Anything is better than nothing if you have time constraints.  Coping skills Choose 5 that work for you:  Take a deep breath  Count to 20  Read a book  Do a puzzle  Meditate  Bake  Sing  Knit  Garden  Pray  Go outside  Call a friend  Listen to music  Take a walk  Color  Send a note  Take a bath  Watch a movie  Be alone in a quiet place  Pet an animal  Visit a friend  Journal  Exercise  Stretch   If you do not hear anything about your referrals in the next 1-2 weeks, call our office and ask for an update.  Let us know if you need anything.

## 2019-07-28 ENCOUNTER — Ambulatory Visit: Payer: 59 | Admitting: Family Medicine

## 2019-07-28 ENCOUNTER — Ambulatory Visit: Payer: 59 | Admitting: Internal Medicine

## 2019-07-31 ENCOUNTER — Ambulatory Visit: Payer: 59 | Admitting: Family Medicine

## 2019-08-11 MED FILL — TORSEMIDE 20 MG TABLET: 20 | 30 days supply | Qty: 30 | Fill #4

## 2019-08-11 MED FILL — ENTRESTO 97 MG-103 MG TAB: 97-103 | 30 days supply | Qty: 60 | Fill #1

## 2019-08-18 ENCOUNTER — Encounter: Payer: Self-pay | Admitting: Family Medicine

## 2019-09-02 ENCOUNTER — Ambulatory Visit: Payer: 59 | Admitting: Family Medicine

## 2019-09-04 ENCOUNTER — Other Ambulatory Visit: Payer: Self-pay | Admitting: Cardiovascular Disease

## 2019-09-04 MED FILL — TORSEMIDE 20 MG TABLET: 20 | 90 days supply | Qty: 90 | Fill #0

## 2019-09-07 ENCOUNTER — Ambulatory Visit (INDEPENDENT_AMBULATORY_CARE_PROVIDER_SITE_OTHER): Payer: 59 | Admitting: *Deleted

## 2019-09-07 DIAGNOSIS — I5022 Chronic systolic (congestive) heart failure: Secondary | ICD-10-CM | POA: Diagnosis not present

## 2019-09-09 ENCOUNTER — Telehealth: Payer: Self-pay | Admitting: *Deleted

## 2019-09-09 LAB — CUP PACEART REMOTE DEVICE CHECK
Battery Remaining Longevity: 68 mo
Battery Remaining Percentage: 79 %
Battery Voltage: 2.99 V
Brady Statistic AP VP Percent: 1 %
Brady Statistic AP VS Percent: 1 %
Brady Statistic AS VP Percent: 99 %
Brady Statistic AS VS Percent: 1 %
Brady Statistic RA Percent Paced: 1 %
Date Time Interrogation Session: 20210811000400
HighPow Impedance: 66 Ohm
HighPow Impedance: 66 Ohm
Implantable Lead Implant Date: 20201105
Implantable Lead Implant Date: 20201105
Implantable Lead Implant Date: 20210225
Implantable Lead Location: 753858
Implantable Lead Location: 753859
Implantable Lead Location: 753860
Implantable Lead Model: 3830
Implantable Pulse Generator Implant Date: 20201105
Lead Channel Impedance Value: 300 Ohm
Lead Channel Impedance Value: 390 Ohm
Lead Channel Impedance Value: 450 Ohm
Lead Channel Pacing Threshold Amplitude: 0.5 V
Lead Channel Pacing Threshold Amplitude: 0.75 V
Lead Channel Pacing Threshold Amplitude: 0.75 V
Lead Channel Pacing Threshold Pulse Width: 0.05 ms
Lead Channel Pacing Threshold Pulse Width: 0.5 ms
Lead Channel Pacing Threshold Pulse Width: 0.5 ms
Lead Channel Sensing Intrinsic Amplitude: 1 mV
Lead Channel Sensing Intrinsic Amplitude: 8.4 mV
Lead Channel Setting Pacing Amplitude: 0.25 V
Lead Channel Setting Pacing Amplitude: 2 V
Lead Channel Setting Pacing Amplitude: 2 V
Lead Channel Setting Pacing Pulse Width: 0.05 ms
Lead Channel Setting Pacing Pulse Width: 0.5 ms
Lead Channel Setting Sensing Sensitivity: 0.5 mV
Pulse Gen Serial Number: 9849338

## 2019-09-09 NOTE — Telephone Encounter (Signed)
Pt called to inquire if transmission has been received. Advised it has been received. She denies additional questions at this time.

## 2019-09-10 NOTE — Progress Notes (Signed)
Remote ICD transmission.   

## 2019-09-23 ENCOUNTER — Ambulatory Visit: Payer: 59 | Admitting: Internal Medicine

## 2019-09-23 ENCOUNTER — Ambulatory Visit: Payer: 59 | Admitting: Gastroenterology

## 2019-12-07 ENCOUNTER — Ambulatory Visit (INDEPENDENT_AMBULATORY_CARE_PROVIDER_SITE_OTHER): Payer: 59

## 2019-12-07 DIAGNOSIS — I447 Left bundle-branch block, unspecified: Secondary | ICD-10-CM

## 2019-12-07 DIAGNOSIS — I5022 Chronic systolic (congestive) heart failure: Secondary | ICD-10-CM

## 2019-12-10 LAB — CUP PACEART REMOTE DEVICE CHECK
Battery Remaining Longevity: 67 mo
Battery Remaining Percentage: 76 %
Battery Voltage: 2.98 V
Brady Statistic AP VP Percent: 1 %
Brady Statistic AP VS Percent: 1 %
Brady Statistic AS VP Percent: 99 %
Brady Statistic AS VS Percent: 1 %
Brady Statistic RA Percent Paced: 1 %
Date Time Interrogation Session: 20211110203602
HighPow Impedance: 66 Ohm
HighPow Impedance: 71 Ohm
Implantable Lead Implant Date: 20201105
Implantable Lead Implant Date: 20201105
Implantable Lead Implant Date: 20210225
Implantable Lead Location: 753858
Implantable Lead Location: 753859
Implantable Lead Location: 753860
Implantable Lead Model: 3830
Implantable Pulse Generator Implant Date: 20201105
Lead Channel Impedance Value: 300 Ohm
Lead Channel Impedance Value: 410 Ohm
Lead Channel Impedance Value: 410 Ohm
Lead Channel Pacing Threshold Amplitude: 0.5 V
Lead Channel Pacing Threshold Amplitude: 0.75 V
Lead Channel Pacing Threshold Amplitude: 0.75 V
Lead Channel Pacing Threshold Pulse Width: 0.05 ms
Lead Channel Pacing Threshold Pulse Width: 0.5 ms
Lead Channel Pacing Threshold Pulse Width: 0.5 ms
Lead Channel Sensing Intrinsic Amplitude: 2.5 mV
Lead Channel Sensing Intrinsic Amplitude: 8.4 mV
Lead Channel Setting Pacing Amplitude: 0.25 V
Lead Channel Setting Pacing Amplitude: 2 V
Lead Channel Setting Pacing Amplitude: 2 V
Lead Channel Setting Pacing Pulse Width: 0.05 ms
Lead Channel Setting Pacing Pulse Width: 0.5 ms
Lead Channel Setting Sensing Sensitivity: 0.5 mV
Pulse Gen Serial Number: 9849338

## 2019-12-11 NOTE — Progress Notes (Signed)
Remote ICD transmission.   

## 2019-12-18 ENCOUNTER — Ambulatory Visit (INDEPENDENT_AMBULATORY_CARE_PROVIDER_SITE_OTHER): Payer: 59

## 2019-12-18 ENCOUNTER — Encounter: Payer: Self-pay | Admitting: Family Medicine

## 2019-12-18 ENCOUNTER — Telehealth: Payer: Self-pay

## 2019-12-18 ENCOUNTER — Other Ambulatory Visit (INDEPENDENT_AMBULATORY_CARE_PROVIDER_SITE_OTHER): Payer: 59

## 2019-12-18 ENCOUNTER — Other Ambulatory Visit: Payer: Self-pay

## 2019-12-18 ENCOUNTER — Telehealth (INDEPENDENT_AMBULATORY_CARE_PROVIDER_SITE_OTHER): Payer: 59 | Admitting: Family Medicine

## 2019-12-18 ENCOUNTER — Other Ambulatory Visit: Payer: Self-pay | Admitting: Family Medicine

## 2019-12-18 VITALS — BP 145/63 | Temp 97.0°F | Ht 65.0 in | Wt 145.0 lb

## 2019-12-18 DIAGNOSIS — R0600 Dyspnea, unspecified: Secondary | ICD-10-CM

## 2019-12-18 LAB — CBC
HCT: 39.8 % (ref 36.0–46.0)
Hemoglobin: 13.2 g/dL (ref 12.0–15.0)
MCHC: 33.1 g/dL (ref 30.0–36.0)
MCV: 85 fl (ref 78.0–100.0)
Platelets: 322 10*3/uL (ref 150.0–400.0)
RBC: 4.68 Mil/uL (ref 3.87–5.11)
RDW: 13 % (ref 11.5–15.5)
WBC: 7.9 10*3/uL (ref 4.0–10.5)

## 2019-12-18 LAB — COMPREHENSIVE METABOLIC PANEL
ALT: 14 U/L (ref 0–35)
AST: 13 U/L (ref 0–37)
Albumin: 4 g/dL (ref 3.5–5.2)
Alkaline Phosphatase: 151 U/L — ABNORMAL HIGH (ref 39–117)
BUN: 18 mg/dL (ref 6–23)
CO2: 26 mEq/L (ref 19–32)
Calcium: 9.1 mg/dL (ref 8.4–10.5)
Chloride: 96 mEq/L (ref 96–112)
Creatinine, Ser: 0.91 mg/dL (ref 0.40–1.20)
GFR: 75.25 mL/min (ref >60.00)
Glucose, Bld: 506 mg/dL (ref 70–99)
Potassium: 4 mEq/L (ref 3.5–5.1)
Sodium: 132 mEq/L — ABNORMAL LOW (ref 135–145)
Total Bilirubin: 0.5 mg/dL (ref 0.2–1.2)
Total Protein: 7.6 g/dL (ref 6.0–8.3)

## 2019-12-18 LAB — MAGNESIUM: Magnesium: 1.8 mg/dL (ref 1.5–2.5)

## 2019-12-18 MED ORDER — GLYBURIDE 5 MG PO TABS: 5.0000 mg | ORAL_TABLET | Freq: Every day | ORAL | 0 refills | Status: DC

## 2019-12-18 MED FILL — glyBURIDE 5 MG TABS: 5 | 30 days supply | Qty: 30 | Fill #0

## 2019-12-18 NOTE — Telephone Encounter (Signed)
CRITICAL VALUE STICKER  CRITICAL VALUE: Glucose 506  RECEIVER (on-site recipient of call): C. Romario Tith  DATE & TIME NOTIFIED: 12/18/19 at 12:03 pm   MESSENGER (representative from lab): [place]  MD NOTIFIED:Wendling, Hart Carwin, DO  TIME OF NOTIFICATION:  RESPONSE:

## 2019-12-18 NOTE — Telephone Encounter (Signed)
Called left message to call back 

## 2019-12-18 NOTE — Telephone Encounter (Signed)
Please let pt know her sugar is really high. I will send in a medicine to take to lower the sugars and I want to see her next week. Ty.

## 2019-12-18 NOTE — Addendum Note (Signed)
Addended by: Ames Coupe on: 12/18/2019 03:08 PM   Modules accepted: Orders

## 2019-12-18 NOTE — Progress Notes (Signed)
Chief Complaint  Patient presents with  . Shortness of Breath    Subjective: Patient is a 49 y.o. female here for shortness of breath. Due to COVID-19 pandemic, we are interacting via web portal for an electronic face-to-face visit. I verified patient's ID using 2 identifiers. Patient agreed to proceed with visit via this method. Patient is in a parked car, I am at office. Patient and I are present for visit.   Over the past 3 weeks, the patient has had increased shortness of breath.  This will come at random times.  Sometimes she is combing her hair and sometimes she is sitting down doing nothing.  She has a history of heart failure but has not noticed any dietary changes or fluid retention.  She is not coughing routinely but will sometimes have some in the morning.  She denies any wheezing, fevers, or other upper respiratory symptoms.  She is not having any blood in her stool or urine.  She does have a history of right upper lobe lung cancer cured by resection in 2017.  She states this is similar where there was no apparent rhyme or reason to her shortness of breath.  Past Medical History:  Diagnosis Date  . Anxiety   . CHF (congestive heart failure) ([place])   . Diabetes mellitus without complication ([place])   . Hypertension   . Lung cancer, upper lobe ([place]) 09/2015   Non-small cell in right upper lobe treated with RULectomy by VATS at [facility]    Objective: BP (!) 145/63 (BP Location: Left Arm, Patient Position: Sitting, Cuff Size: Normal)   Temp (!) 97 F (36.1 C) (Oral)   Ht 5\' 5"  (1.651 m)   Wt 145 lb (65.8 kg)   BMI 24.13 kg/m  No conversational dyspnea Age appropriate judgment and insight Nml affect and mood  Assessment and Plan: Dyspnea, unspecified type - Plan: DG Chest 2 View, CBC, Comprehensive metabolic panel, Magnesium  Unfortunately I cannot listen to her lungs today.  I would like to check a chest x-ray in addition to basic blood work.  If these are all normal, I would like  her to reach out to her heart failure team for follow-up.  I would also try a short acting beta agonist with follow-up in 1 month.  If no improvement that time, would consider noncontrast CT scan of the chest. The patient voiced understanding and agreement to the plan.  [place], DO 12/18/19  12:41 PM

## 2019-12-21 ENCOUNTER — Telehealth: Payer: Self-pay

## 2019-12-21 ENCOUNTER — Other Ambulatory Visit (HOSPITAL_COMMUNITY): Payer: Self-pay | Admitting: Family Medicine

## 2019-12-21 ENCOUNTER — Telehealth: Payer: Self-pay | Admitting: Family Medicine

## 2019-12-21 DIAGNOSIS — R9389 Abnormal findings on diagnostic imaging of other specified body structures: Secondary | ICD-10-CM

## 2019-12-21 MED ORDER — DEXCOM G5 RECEIVER KIT DEVI: 0 refills | Status: AC

## 2019-12-21 MED ORDER — DEXCOM G6 TRANSMITTER MISC
0 refills | Status: DC
Start: 2019-12-21 — End: 2019-12-21

## 2019-12-21 MED ORDER — DEXCOM G6 SENSOR MISC
0 refills | Status: DC
Start: 2019-12-21 — End: 2020-02-04

## 2019-12-21 NOTE — Telephone Encounter (Signed)
Called left message to call back 

## 2019-12-21 NOTE — Addendum Note (Signed)
Addended by: Sharon Seller B on: 12/21/2019 03:01 PM   Modules accepted: Orders

## 2019-12-21 NOTE — Telephone Encounter (Signed)
Called pt regarding CXR results. CT order placed for asap given the anxiety associated with this. All questions answered.

## 2019-12-21 NOTE — Telephone Encounter (Signed)
[facility] called they just was to make sure ct was in chart and that Dr. Nani Ravens was aware of results.

## 2019-12-21 NOTE — Telephone Encounter (Signed)
(  Receiver device) PA initiated via Covermymeds; KEY: [id number].   (Sensor) KEY: [place].   (Transmitter) [id number]   Awaiting determination.

## 2019-12-21 NOTE — Telephone Encounter (Signed)
Called informed of results// She will call back to schedule an appointment as had to ask her work a time that is ok to take off.

## 2019-12-21 NOTE — Addendum Note (Signed)
Addended by: Sharon Seller B on: 12/21/2019 09:20 AM   Modules accepted: Orders

## 2019-12-21 NOTE — Telephone Encounter (Signed)
Sent in [facility]

## 2019-12-22 ENCOUNTER — Other Ambulatory Visit: Payer: Self-pay | Admitting: Family Medicine

## 2019-12-22 MED ORDER — HYDROXYZINE HCL 10 MG PO TABS
10.0000 mg | ORAL_TABLET | Freq: Three times a day (TID) | ORAL | 0 refills | Status: DC | PRN
Start: 2019-12-22 — End: 2019-12-22

## 2019-12-22 MED FILL — hydrOXYzine HCL 10 MG TABS: 10 | 5 days supply | Qty: 30 | Fill #0

## 2019-12-23 ENCOUNTER — Other Ambulatory Visit: Payer: Self-pay

## 2019-12-23 ENCOUNTER — Encounter (HOSPITAL_COMMUNITY): Payer: Self-pay

## 2019-12-23 ENCOUNTER — Ambulatory Visit (HOSPITAL_COMMUNITY)
Admission: RE | Admit: 2019-12-23 | Discharge: 2019-12-23 | Disposition: A | Discharge status: Home / Self Care | Payer: 59 | Source: Ambulatory Visit | Attending: Family Medicine | Admitting: Family Medicine

## 2019-12-23 DIAGNOSIS — R9389 Abnormal findings on diagnostic imaging of other specified body structures: Secondary | ICD-10-CM | POA: Insufficient documentation

## 2019-12-23 DIAGNOSIS — J984 Other disorders of lung: Secondary | ICD-10-CM | POA: Diagnosis not present

## 2019-12-23 DIAGNOSIS — I700 Atherosclerosis of aorta: Secondary | ICD-10-CM | POA: Diagnosis not present

## 2019-12-23 DIAGNOSIS — R918 Other nonspecific abnormal finding of lung field: Secondary | ICD-10-CM | POA: Diagnosis not present

## 2019-12-23 MED ORDER — IOHEXOL 300 MG/ML  SOLN
75.0000 mL | Freq: Once | INTRAMUSCULAR | Status: AC | PRN
  Administered 2019-12-23: 75 mL via INTRAVENOUS

## 2019-12-23 NOTE — Telephone Encounter (Signed)
PA approved for sensors. 1 kit (3 sensors) per month.

## 2019-12-23 NOTE — Telephone Encounter (Signed)
PA approved for transmitter. 1 transmitter for 90 days.

## 2019-12-23 NOTE — Telephone Encounter (Signed)
PA approved for receiver/device. 1 receiver/device per 12 months.

## 2019-12-25 MED FILL — DEXCOM G6 SENSOR MISC: 30 days supply | Qty: 3 | Fill #0

## 2019-12-25 MED FILL — DEXCOM G6 TRANSMITTER MISC: 90 days supply | Qty: 1 | Fill #0

## 2019-12-31 ENCOUNTER — Ambulatory Visit: Payer: 59 | Admitting: Family Medicine

## 2019-12-31 ENCOUNTER — Encounter: Payer: Self-pay | Admitting: Family Medicine

## 2019-12-31 ENCOUNTER — Other Ambulatory Visit: Payer: Self-pay

## 2019-12-31 ENCOUNTER — Other Ambulatory Visit: Payer: Self-pay | Admitting: Family Medicine

## 2019-12-31 VITALS — BP 144/98 | HR 71 | Temp 97.8°F | Resp 16 | Ht 65.0 in | Wt 144.0 lb

## 2019-12-31 DIAGNOSIS — E1165 Type 2 diabetes mellitus with hyperglycemia: Secondary | ICD-10-CM

## 2019-12-31 DIAGNOSIS — E1149 Type 2 diabetes mellitus with other diabetic neurological complication: Secondary | ICD-10-CM

## 2019-12-31 DIAGNOSIS — I10 Essential (primary) hypertension: Secondary | ICD-10-CM | POA: Diagnosis not present

## 2019-12-31 DIAGNOSIS — Z1231 Encounter for screening mammogram for malignant neoplasm of breast: Secondary | ICD-10-CM

## 2019-12-31 MED ORDER — INSULIN DETEMIR 100 UNIT/ML FLEXPEN: 10.0000 [IU] | PEN_INJECTOR | Freq: Every day | SUBCUTANEOUS | 11 refills | Status: DC

## 2019-12-31 MED ORDER — PRAVASTATIN SODIUM 10 MG PO TABS: 10.0000 mg | ORAL_TABLET | Freq: Every day | ORAL | 3 refills | Status: DC

## 2019-12-31 MED ORDER — GABAPENTIN 300 MG PO CAPS: 300.0000 mg | ORAL_CAPSULE | Freq: Every day | ORAL | 3 refills | Status: DC

## 2019-12-31 MED ORDER — METFORMIN HCL ER 500 MG PO TB24: 500.0000 mg | ORAL_TABLET | Freq: Every day | ORAL | 1 refills | Status: DC

## 2019-12-31 MED ORDER — AMLODIPINE BESYLATE 5 MG PO TABS: 5.0000 mg | ORAL_TABLET | Freq: Every day | ORAL | 3 refills | Status: DC

## 2019-12-31 MED FILL — AMLODIPINE BESYLATE 5 MG TA: 5 | 30 days supply | Qty: 30 | Fill #0

## 2019-12-31 MED FILL — GABAPENTIN 300 MG CAPSULE: 300 | 30 days supply | Qty: 30 | Fill #0

## 2019-12-31 MED FILL — METFORMIN HCL ER 500 MG TB2: 500 | 30 days supply | Qty: 30 | Fill #0

## 2019-12-31 MED FILL — PRAVASTATIN NA 10 MG TAB: 10 | 30 days supply | Qty: 30 | Fill #0

## 2019-12-31 NOTE — Patient Instructions (Addendum)
Give [place] 2-3 business days to get the results of your labs back.   Aim to do some physical exertion for 150 minutes per week. This is typically divided into 5 days per week, 30 minutes per day. The activity should be enough to get your heart rate up. Anything is better than nothing if you have time constraints. Consider YouTube.   Healthy Eating Plan Many factors influence your heart health, including eating and exercise habits. Heart (coronary) risk increases with abnormal blood fat (lipid) levels. Heart-healthy meal planning includes limiting unhealthy fats, increasing healthy fats, and making other small dietary changes. This includes maintaining a healthy body weight to help keep lipid levels within a normal range.  WHAT IS MY PLAN?  Your health care provider recommends that you:  Drink a glass of water before meals to help with satiety.  Eat slowly.  An alternative to the water is to add Metamucil. This will help with satiety as well. It does contain calories, unlike water.  WHAT TYPES OF FAT SHOULD I CHOOSE?  Choose healthy fats more often. Choose monounsaturated and polyunsaturated fats, such as olive oil and canola oil, flaxseeds, walnuts, almonds, and seeds.  Eat more omega-3 fats. Good choices include salmon, mackerel, sardines, tuna, flaxseed oil, and ground flaxseeds. Aim to eat fish at least two times each week.  Avoid foods with partially hydrogenated oils in them. These contain trans fats. Examples of foods that contain trans fats are stick margarine, some tub margarines, cookies, crackers, and other baked goods. If you are going to avoid a fat, this is the one to avoid!  WHAT GENERAL GUIDELINES DO I NEED TO FOLLOW?  Check food labels carefully to identify foods with trans fats. Avoid these types of options when possible.  Fill one half of your plate with vegetables and green salads. Eat 4-5 servings of vegetables per day. A serving of vegetables equals 1 cup of raw leafy  vegetables,  cup of raw or cooked cut-up vegetables, or  cup of vegetable juice.  Fill one fourth of your plate with whole grains. Look for the word "whole" as the first word in the ingredient list.  Fill one fourth of your plate with lean protein foods.  Eat 4-5 servings of fruit per day. A serving of fruit equals one medium whole fruit,  cup of dried fruit,  cup of fresh, frozen, or canned fruit. Try to avoid fruits in cups/syrups as the sugar content can be high.  Eat more foods that contain soluble fiber. Examples of foods that contain this type of fiber are apples, broccoli, carrots, beans, peas, and barley. Aim to get 20-30 g of fiber per day.  Eat more home-cooked food and less restaurant, buffet, and fast food.  Limit or avoid alcohol.  Limit foods that are high in starch and sugar.  Avoid fried foods when able.  Cook foods by using methods other than frying. Baking, boiling, grilling, and broiling are all great options. Other fat-reducing suggestions include: ? Removing the skin from poultry. ? Removing all visible fats from meats. ? Skimming the fat off of stews, soups, and gravies before serving them. ? Steaming vegetables in water or broth.  Lose weight if you are overweight. Losing just 5-10% of your initial body weight can help your overall health and prevent diseases such as diabetes and heart disease.  Increase your consumption of nuts, legumes, and seeds to 4-5 servings per week. One serving of dried beans or legumes equals  cup after  being cooked, one serving of nuts equals 1 ounces, and one serving of seeds equals  ounce or 1 tablespoon.  WHAT ARE GOOD FOODS CAN I EAT? Grains Grainy breads (try to find bread that is 3 g of fiber per slice or greater), oatmeal, light popcorn. Whole-grain cereals. Rice and pasta, including brown rice and those that are made with whole wheat. Edamame pasta is a great alternative to grain pasta. It has a higher protein content. Try  to avoid significant consumption of white bread, sugary cereals, or pastries/baked goods.  Vegetables All vegetables. Cooked white potatoes do not count as vegetables.  Fruits All fruits, but limit pineapple and bananas as these fruits have a higher sugar content.  Meats and Other Protein Sources Lean, well-trimmed beef, veal, pork, and lamb. Chicken and [place] without skin. All fish and shellfish. Wild duck, rabbit, pheasant, and venison. Egg whites or low-cholesterol egg substitutes. Dried beans, peas, lentils, and tofu.Seeds and most nuts.  Dairy Low-fat or nonfat cheeses, including ricotta, string, and mozzarella. Skim or 1% milk that is liquid, powdered, or evaporated. Buttermilk that is made with low-fat milk. Nonfat or low-fat yogurt. Soy/Almond milk are good alternatives if you cannot handle dairy.  Beverages Water is the best for you. Sports drinks with less sugar are more desirable unless you are a highly active athlete.  Sweets and Desserts Sherbets and fruit ices. Honey, jam, marmalade, jelly, and syrups. Dark chocolate.  Eat all sweets and desserts in moderation.  Fats and Oils Nonhydrogenated (trans-free) margarines. Vegetable oils, including soybean, sesame, sunflower, olive, peanut, safflower, corn, canola, and cottonseed. Salad dressings or mayonnaise that are made with a vegetable oil. Limit added fats and oils that you use for cooking, baking, salads, and as spreads.  Other Cocoa powder. Coffee and tea. Most condiments.  The items listed above may not be a complete list of recommended foods or beverages. Contact your dietitian for more options.

## 2019-12-31 NOTE — Progress Notes (Signed)
Subjective:   Chief Complaint  Patient presents with  . Tingling in feet  . Blood Sugar Follow Up    Crystal Bryant is a 49 y.o. female here for follow-up of diabetes.   Cyndy's self monitored glucose range is high 200 and low 300's.  Patient denies hypoglycemic reactions. She checks her glucose levels 1 time(s) per day. Patient does require insulin.   Medications include: Glyburide 5 mg/d was recently added; did not do well with metformin in the past.  Diet is fair.  Exercise: none She has been having burning, numbness, and tingling in her feet for several months.  It does not bother her too much during the day.  She has never tried any medication for this.  Hypertension Patient presents for hypertension follow up. She does monitor home blood pressures. Blood pressures ranging on average from 140's/80's. She is compliant with medications-Coreg 25 mg twice daily, Entresto. Patient has these side effects of medication: none Diet and exercise as above  Past Medical History:  Diagnosis Date  . Anxiety   . CHF (congestive heart failure) ([place])   . Diabetes mellitus without complication ([place])   . Hypertension   . Lung cancer, upper lobe ([place]) 09/2015   Non-small cell in right upper lobe treated with RULectomy by VATS at [facility]     Objective:  BP (!) 144/98 (BP Location: Right Arm, Patient Position: Sitting, Cuff Size: Normal)   Pulse 71   Temp 97.8 F (36.6 C) (Oral)   Resp 16   Ht 5\' 5"  (1.651 m)   Wt 144 lb (65.3 kg)   SpO2 97%   BMI 23.96 kg/m  General:  Well developed, well nourished, in no apparent distress Skin:  Warm, no pallor or diaphoresis Lungs:  CTAB, no access msc use Cardio:  RRR, no bruits, no LE edema Psych: Age appropriate judgment and insight  Assessment:   Type 2 diabetes mellitus with hyperglycemia, without long-term current use of insulin (HCC) - Plan: insulin detemir (LEVEMIR) 100 UNIT/ML FlexPen, metFORMIN (GLUCOPHAGE XR) 500 MG 24 hr tablet,  pravastatin (PRAVACHOL) 10 MG tablet  Screening mammogram for breast cancer - Plan: MM DIGITAL SCREENING BILATERAL  Essential hypertension, benign - Plan: amLODipine (NORVASC) 5 MG tablet  Other diabetic neurological complication associated with type 2 diabetes mellitus ([place]) - Plan: gabapentin (NEURONTIN) 300 MG capsule   Plan:   1.  Add nightly dose of Levemir 10 units.  Continue sliding scale insulin.  Add Metformin XR 500 mg daily, she did not do well with short acting Metformin due to GI side effects.  Monitor sugars at home.  Add pravastatin at a low dose that she has failed atorvastatin and rosuvastatin secondary to myalgias.  Counseled on diet and exercise.  She needs to reach out to her endocrinologist. 2.  Continue Coreg 25 mg twice daily, continue Entresto 97-103 mg bid.  Add amlodipine 5 mg daily.  Monitor blood pressure at work. 3.  Start gabapentin nightly for neuropathy. F/u in 1 mo to recheck 2/3. The patient voiced understanding and agreement to the plan.  [place], DO 12/31/19 8:16 AM

## 2020-01-01 ENCOUNTER — Telehealth: Payer: Self-pay

## 2020-01-01 MED FILL — DEXCOM G6 RECEIVER DEVI: 90 days supply | Qty: 1 | Fill #0

## 2020-01-01 NOTE — Telephone Encounter (Signed)
Patient called stating she was to have a change made to her diabetes injectable med, Levemir, but pharmacy has not received the change.

## 2020-01-04 ENCOUNTER — Other Ambulatory Visit (HOSPITAL_COMMUNITY): Payer: Self-pay | Admitting: Family Medicine

## 2020-01-04 MED FILL — BASAGLAR 100 UNIT/ML KWIKPE: 100 | 90 days supply | Qty: 9 | Fill #0

## 2020-01-04 NOTE — Telephone Encounter (Signed)
Called left message to call back. PCP has faxed to [place] ok to change

## 2020-01-08 ENCOUNTER — Telehealth: Payer: Self-pay | Admitting: Family Medicine

## 2020-01-08 DIAGNOSIS — E1165 Type 2 diabetes mellitus with hyperglycemia: Secondary | ICD-10-CM

## 2020-01-08 DIAGNOSIS — E1149 Type 2 diabetes mellitus with other diabetic neurological complication: Secondary | ICD-10-CM

## 2020-01-08 MED ORDER — GABAPENTIN 300 MG PO CAPS: 600.0000 mg | ORAL_CAPSULE | Freq: Every day | ORAL | 3 refills | Status: AC

## 2020-01-08 NOTE — Telephone Encounter (Signed)
Patient states she is having pain and burning in her foot night and day . Patient would like to know if ok to go up on the dose of gabapentin (NEURONTIN) 300 MG capsule   Please Advise

## 2020-01-08 NOTE — Telephone Encounter (Signed)
Take 2 caps at night.

## 2020-01-08 NOTE — Telephone Encounter (Signed)
Patient informed of PCP response. She verbalized understanding.

## 2020-01-08 NOTE — Addendum Note (Signed)
Addended by: Sharon Seller B on: 01/08/2020 04:57 PM   Modules accepted: Orders

## 2020-01-19 DIAGNOSIS — H25813 Combined forms of age-related cataract, bilateral: Secondary | ICD-10-CM | POA: Diagnosis not present

## 2020-01-19 DIAGNOSIS — E113413 Type 2 diabetes mellitus with severe nonproliferative diabetic retinopathy with macular edema, bilateral: Secondary | ICD-10-CM | POA: Diagnosis not present

## 2020-01-20 ENCOUNTER — Telehealth: Payer: Self-pay | Admitting: Family Medicine

## 2020-01-20 NOTE — Telephone Encounter (Signed)
Patient informed PCP states she needs appointment to complete this form. Called her informed///she will schedule an appt. Through my chart to have this completed.

## 2020-01-20 NOTE — Telephone Encounter (Signed)
Document faxed to our office ([employer] 6 pages) to be filled out. Document put at front office tray under providers name.

## 2020-01-25 NOTE — Telephone Encounter (Signed)
Doc was faxed into office   Not sure if its the same paper work or not but it is from [employer]

## 2020-01-26 ENCOUNTER — Other Ambulatory Visit (HOSPITAL_COMMUNITY): Payer: Self-pay | Admitting: Family Medicine

## 2020-01-26 DIAGNOSIS — H43823 Vitreomacular adhesion, bilateral: Secondary | ICD-10-CM | POA: Diagnosis not present

## 2020-01-26 DIAGNOSIS — Z1231 Encounter for screening mammogram for malignant neoplasm of breast: Secondary | ICD-10-CM

## 2020-01-26 DIAGNOSIS — H2513 Age-related nuclear cataract, bilateral: Secondary | ICD-10-CM | POA: Diagnosis not present

## 2020-01-26 DIAGNOSIS — E113313 Type 2 diabetes mellitus with moderate nonproliferative diabetic retinopathy with macular edema, bilateral: Secondary | ICD-10-CM | POA: Diagnosis not present

## 2020-02-01 ENCOUNTER — Other Ambulatory Visit: Payer: Self-pay

## 2020-02-01 ENCOUNTER — Ambulatory Visit (HOSPITAL_COMMUNITY)
Admission: RE | Admit: 2020-02-01 | Discharge: 2020-02-01 | Disposition: A | Discharge status: Home / Self Care | Payer: 59 | Source: Ambulatory Visit | Attending: Family Medicine | Admitting: Family Medicine

## 2020-02-01 DIAGNOSIS — Z1231 Encounter for screening mammogram for malignant neoplasm of breast: Secondary | ICD-10-CM | POA: Insufficient documentation

## 2020-02-01 NOTE — Telephone Encounter (Signed)
FMLA documents sent in again

## 2020-02-04 ENCOUNTER — Other Ambulatory Visit: Payer: Self-pay

## 2020-02-04 ENCOUNTER — Other Ambulatory Visit: Payer: Self-pay | Admitting: Family Medicine

## 2020-02-04 MED ORDER — DEXCOM G6 SENSOR MISC
12 refills | Status: DC
Start: 2020-02-04 — End: 2020-02-04

## 2020-02-04 MED FILL — DEXCOM G6 SENSOR MISC: 30 days supply | Qty: 3 | Fill #0

## 2020-02-05 ENCOUNTER — Ambulatory Visit: Payer: 59 | Admitting: Family Medicine

## 2020-02-11 DIAGNOSIS — Z1231 Encounter for screening mammogram for malignant neoplasm of breast: Secondary | ICD-10-CM

## 2020-02-16 ENCOUNTER — Other Ambulatory Visit: Payer: Self-pay

## 2020-02-16 ENCOUNTER — Other Ambulatory Visit (HOSPITAL_COMMUNITY): Payer: Self-pay | Admitting: Family Medicine

## 2020-02-16 ENCOUNTER — Ambulatory Visit: Payer: 59 | Admitting: Endocrinology

## 2020-02-16 VITALS — BP 220/120 | HR 111 | Ht 65.0 in | Wt 149.0 lb

## 2020-02-16 DIAGNOSIS — E1165 Type 2 diabetes mellitus with hyperglycemia: Secondary | ICD-10-CM

## 2020-02-16 LAB — POCT GLYCOSYLATED HEMOGLOBIN (HGB A1C): Hemoglobin A1C: 11.5 % — AB (ref 4.0–5.6)

## 2020-02-16 MED ORDER — INSULIN DETEMIR 100 UNIT/ML FLEXPEN: 20.0000 [IU] | PEN_INJECTOR | Freq: Every day | SUBCUTANEOUS | 11 refills | Status: DC

## 2020-02-16 MED ORDER — INSULIN LISPRO (1 UNIT DIAL) 100 UNIT/ML (KWIKPEN): 10.0000 [IU] | PEN_INJECTOR | Freq: Three times a day (TID) | SUBCUTANEOUS | 3 refills | Status: DC

## 2020-02-16 MED FILL — HUMALOG 100 UNITS/ML KWIKPE: 100 | 90 days supply | Qty: 27 | Fill #0

## 2020-02-16 MED FILL — UNIFINE PENTIPS 6MM 31G: 31G X 6 MM | 90 days supply | Qty: 400 | Fill #0

## 2020-02-16 NOTE — Progress Notes (Signed)
Subjective:    Patient ID: Crystal Bryant, female    DOB: 1970-11-19, 50 y.o.   MRN: [id number]  HPI pt is referred by Dr Nani Ravens, for diabetes.  Pt states DM was dx'ed in 8916; it is complicated by DR and PN; she has been on insulin since 2021; pt says her diet and exercise are fair; she has never had GDM, pancreatitis, pancreatic surgery, severe hypoglycemia or DKA.  She has excessive thirst.  She has Dexcom G6, but does not have transmitter with her today.  She says glucose varies from 300-400.  She takes metformin, levemir 10 units QHS, and PRN Novolog (averages 20-25 units/day).  She has missed her BP rx recently, but not the insulin.   Past Medical History:  Diagnosis Date  . Anxiety   . CHF (congestive heart failure) ([place])   . Diabetes mellitus without complication ([place])   . Hypertension   . Lung cancer, upper lobe ([place]) 09/2015   Non-small cell in right upper lobe treated with RULectomy by VATS at [facility]    Past Surgical History:  Procedure Laterality Date  . ABDOMINAL HYSTERECTOMY    . BIV ICD INSERTION CRT-D N/A 12/04/2018   Procedure: BIV ICD INSERTION CRT-D;  Surgeon: Evans Lance, MD;  Location: [place] [facility];  Service: Cardiovascular;  Laterality: N/A;  . BREAST BIOPSY Right 02/10/2018   Biopsy of upper outer quadrant of right breast due to calcifications seen on mammography revealed fat necrosis with calcifications.  Benign so continue annual screening mammograms.  Marland Kitchen BREAST SURGERY    . CESAREAN SECTION    . LEAD REVISION/REPAIR N/A 03/26/2019   Procedure: LEAD REVISION/REPAIR;  Surgeon: Evans Lance, MD;  Location: [facility];  Service: Cardiovascular;  Laterality: N/A;  . LOBECTOMY Right 10/06/2015   [place] lung cancer treated with VATS right upper lobectomy by Dr. [name] at [facility].    Social History   Socioeconomic History  . Marital status: Divorced    Spouse name: Not on file  . Number of children: 2  . Years of education: Not on file  .  Highest education level: Not on file  Occupational History  . Occupation: Patient Engineer, manufacturing systems: [employer]  Tobacco Use  . Smoking status: Never Smoker  . Smokeless tobacco: Never Used  Vaping Use  . Vaping Use: Never used  Substance and Sexual Activity  . Alcohol use: No  . Drug use: No  . Sexual activity: Yes    Birth control/protection: Surgical  Other Topics Concern  . Not on file  Social History Narrative  . Not on file   Social Determinants of Health   Financial Resource Strain: Not on file  Food Insecurity: Not on file  Transportation Needs: Not on file  Physical Activity: Not on file  Stress: Not on file  Social Connections: Not on file  Intimate Partner Violence: Not on file    Current Outpatient Medications on File Prior to Visit  Medication Sig Dispense Refill  . amLODipine (NORVASC) 5 MG tablet Take 1 tablet (5 mg total) by mouth daily. 30 tablet 3  . carvedilol (COREG) 25 MG tablet Take 2 tablets (50 mg total) by mouth 2 (two) times daily with a meal. 360 tablet 2  . Continuous Blood Gluc Receiver (DEXCOM G5 RECEIVER KIT) DEVI Use daily to check blood sugar.  DXE11.9 1 each 0  . Continuous Blood Gluc Sensor (DEXCOM G6 SENSOR) MISC Use daily to check blood sugar.  DX E11.9 3 each 12  . Continuous Blood Gluc Transmit (DEXCOM G6 TRANSMITTER) MISC Use to check blood sugar. DXE11.9 1 each 0  . gabapentin (NEURONTIN) 300 MG capsule Take 2 capsules (600 mg total) by mouth at bedtime. 30 capsule 3  . hydrOXYzine (ATARAX/VISTARIL) 10 MG tablet Take 1-2 tablets (10-20 mg total) by mouth every 8 (eight) hours as needed for anxiety. 30 tablet 0  . metFORMIN (GLUCOPHAGE XR) 500 MG 24 hr tablet Take 1 tablet (500 mg total) by mouth daily with breakfast. 30 tablet 1  . potassium chloride 20 MEQ/15ML (10%) SOLN Take 7.5 mLs (10 mEq total) by mouth daily. 473 mL 2  . pravastatin (PRAVACHOL) 10 MG tablet Take 1 tablet (10 mg total) by mouth daily. 30 tablet 3   . sacubitril-valsartan (ENTRESTO) 97-103 MG Take 1 tablet by mouth 2 (two) times daily. 60 tablet 11  . torsemide (DEMADEX) 20 MG tablet TAKE 1 TABLET (20 MG TOTAL) BY MOUTH DAILY. 90 tablet 3  . Blood Glucose Monitoring Suppl (FREESTYLE LITE) DEVI Use As Directed 1 Device 0  . glucose blood (FREESTYLE LITE) test strip Use as instructed 100 each 1  . Lancets (FREESTYLE) lancets Use as instructed 100 each 1   Current Facility-Administered Medications on File Prior to Visit  Medication Dose Route Frequency Provider Last Rate Last Admin  . ivabradine (CORLANOR) tablet 5 mg  5 mg Oral BID WC Nahser, Wonda Cheng, MD        No Known Allergies  Family History  Problem Relation Age of Onset  . Diabetes Mother   . Hypertension Mother   . Stroke Father   . Breast cancer Paternal Aunt   . Breast cancer Paternal Aunt   . Breast cancer Paternal Aunt   . Breast cancer Paternal Aunt     BP (!) 220/120 (BP Location: Right Arm, Patient Position: Sitting, Cuff Size: Normal)   Pulse (!) 111   Ht '5\' 5"'  (1.651 m)   Wt 149 lb (67.6 kg)   SpO2 98%   BMI 24.79 kg/m    Review of Systems denies weight loss, chest pain, sob, n/v, hypoglycemia, and depression.      Objective:   Physical Exam VITAL SIGNS:  See vs page GENERAL: no distress Pulses: dorsalis pedis intact bilat.   MSK: no deformity of the feet CV: no leg edema Skin:  no ulcer on the feet.  normal color and temp on the feet.   Neuro: sensation is intact to touch on the feet.    Lab Results  Component Value Date   HGBA1C 11.5 (A) 02/16/2020   Lab Results  Component Value Date   CREATININE 0.91 12/18/2019   BUN 18 12/18/2019   NA 132 (L) 12/18/2019   K 4.0 12/18/2019   CL 96 12/18/2019   CO2 26 12/18/2019    I have reviewed outside records, and summarized: Pt was noted to have elevated A1c, and referred here.  She did not tolerate metformin.  Other oral rx was ineffective.       Assessment & Plan:  HTN: is noted  today Insulin-requiring type 2 DM: uncontrolled.  Given severe hyperglycemia and lean body habitus, she may be evolving type 1 DM.   Patient Instructions  Your blood pressure is high today.  Please take your meds ASAP, and see your primary care provider soon, to have it rechecked.   good diet and exercise significantly improve the control of your diabetes.  please let me know if  you wish to be referred to a dietician.  high blood sugar is very risky to your health.  you should see an eye doctor and dentist every year.  It is very important to get all recommended vaccinations.   Controlling your blood pressure and cholesterol drastically reduces the damage diabetes does to your body.  Those who smoke should quit.  Please discuss these with your doctor.   check your blood sugar twice a day.  vary the time of day when you check, between before the 3 meals, and at bedtime.  also check if you have symptoms of your blood sugar being too high or too low.  please keep a record of the readings and bring it to your next appointment here (or you can bring the meter itself).  You can write it on any piece of paper.  please call us sooner if your blood sugar goes below 70, or if you have a lot of readings over 200.   We will need to take this complex situation in stages For now, please increase the insulins to the numbers listed below, no matter what your blood sugar is. Please see Vaughan Basta, to consider an insulin pump.  Please come back for a follow-up appointment in 2 weeks.

## 2020-02-16 NOTE — Patient Instructions (Addendum)
Your blood pressure is high today.  Please take your meds ASAP, and see your primary care provider soon, to have it rechecked.   good diet and exercise significantly improve the control of your diabetes.  please let me know if you wish to be referred to a dietician.  high blood sugar is very risky to your health.  you should see an eye doctor and dentist every year.  It is very important to get all recommended vaccinations.   Controlling your blood pressure and cholesterol drastically reduces the damage diabetes does to your body.  Those who smoke should quit.  Please discuss these with your doctor.   check your blood sugar twice a day.  vary the time of day when you check, between before the 3 meals, and at bedtime.  also check if you have symptoms of your blood sugar being too high or too low.  please keep a record of the readings and bring it to your next appointment here (or you can bring the meter itself).  You can write it on any piece of paper.  please call us sooner if your blood sugar goes below 70, or if you have a lot of readings over 200.   We will need to take this complex situation in stages For now, please increase the insulins to the numbers listed below, no matter what your blood sugar is. Please see Vaughan Basta, to consider an insulin pump.  Please come back for a follow-up appointment in 2 weeks.

## 2020-02-24 ENCOUNTER — Telehealth (INDEPENDENT_AMBULATORY_CARE_PROVIDER_SITE_OTHER): Payer: 59 | Admitting: Family Medicine

## 2020-02-24 ENCOUNTER — Other Ambulatory Visit: Payer: Self-pay

## 2020-02-24 ENCOUNTER — Encounter: Payer: Self-pay | Admitting: Family Medicine

## 2020-02-24 ENCOUNTER — Other Ambulatory Visit: Payer: Self-pay | Admitting: Family Medicine

## 2020-02-24 DIAGNOSIS — F411 Generalized anxiety disorder: Secondary | ICD-10-CM | POA: Diagnosis not present

## 2020-02-24 MED ORDER — VENLAFAXINE HCL ER 37.5 MG PO CP24: 37.5000 mg | ORAL_CAPSULE | Freq: Every day | ORAL | 2 refills | Status: DC

## 2020-02-24 MED FILL — VENLAFAXINE HCL ER 37.5 MG: 37.5 | 30 days supply | Qty: 30 | Fill #0

## 2020-02-24 NOTE — Progress Notes (Signed)
Chief Complaint  Patient presents with  . Anxiety    FMLA     Subjective Crystal Bryant is an 50 y.o. female who presents with anxiety. Due to COVID-19 pandemic, we are interacting via web portal for an electronic face-to-face visit. I verified patient's ID using 2 identifiers. Patient agreed to proceed with visit via this method. Patient is at home, I am at office. Patient and I are present for visit.  Symptoms began worsening around 1.5 mo ago.  Anxiety symptoms: difficulty concentrating, fatigue, feelings of losing control, insomnia, irritable, psychomotor agitation, racing thoughts. She has avoidance of doing certain things like getting groceries because she fears covid. This led to SOB and presumed CHF that has left her worried to experience again.  Family history significant for PTSD in mom. Social stressors include work and pandemic She is currently being treated with None and has failed Lexapro and Zoloft 2/2 GI effects and HA respectively. She is following with a psychologist. She has reached out to [facility] and is waiting on an appt.   Past Medical History:  Diagnosis Date  . Anxiety   . CHF (congestive heart failure) ([place])   . Diabetes mellitus without complication ([place])   . Hypertension   . Lung cancer, upper lobe ([place]) 09/2015   Non-small cell in right upper lobe treated with RULectomy by VATS at [facility] History Family History  Problem Relation Age of Onset  . Diabetes Mother   . Hypertension Mother   . Stroke Father   . Breast cancer Paternal Aunt   . Breast cancer Paternal Aunt   . Breast cancer Paternal Aunt   . Breast cancer Paternal Aunt     Exam No conversational dyspnea Age appropriate judgment and insight Nml affect and mood  Assessment and Plan  GAD (generalized anxiety disorder) - Plan: venlafaxine XR (EFFEXOR XR) 37.5 MG 24 hr capsule  Start Effexor XR 37.5 mg/d.  Counseled on adjunctive treatment with exercise/physical activity. Reach  out to [facility] again for appt.  Sent message with anxiety coping techniques.  I completed FMLA form allowing for 2 absences weekly lasting 4 hrs per absence. This is to allow her to leave work for counseling visits. I don't think it will be this frequent for periods of time, she will let me know if things change. F/u in 6 weeks. Patient voiced understanding and agreement to the plan.  Greater than 40 minutes were spent with the patient in addition to filling out FMLA form and reviewing their chart information on the same day of the visit.   [place], DO 02/24/20 7:33 AM

## 2020-02-26 ENCOUNTER — Telehealth: Payer: Self-pay | Admitting: Family Medicine

## 2020-02-26 NOTE — Telephone Encounter (Signed)
Form placed in red folder for provider to review.

## 2020-02-26 NOTE — Telephone Encounter (Signed)
Paperwork faxed into front office  Put into wendlings bin up front

## 2020-03-01 NOTE — Telephone Encounter (Signed)
Paperwork faxed °

## 2020-03-07 ENCOUNTER — Ambulatory Visit (INDEPENDENT_AMBULATORY_CARE_PROVIDER_SITE_OTHER): Payer: 59

## 2020-03-07 DIAGNOSIS — I447 Left bundle-branch block, unspecified: Secondary | ICD-10-CM

## 2020-03-09 ENCOUNTER — Ambulatory Visit: Payer: 59 | Admitting: Psychologist

## 2020-03-09 LAB — CUP PACEART REMOTE DEVICE CHECK
Battery Remaining Longevity: 64 mo
Battery Remaining Percentage: 73 %
Battery Voltage: 2.98 V
Brady Statistic AP VP Percent: 1 %
Brady Statistic AP VS Percent: 1 %
Brady Statistic AS VP Percent: 99 %
Brady Statistic AS VS Percent: 1 %
Brady Statistic RA Percent Paced: 1 %
Date Time Interrogation Session: 20220208040522
HighPow Impedance: 61 Ohm
HighPow Impedance: 61 Ohm
Implantable Lead Implant Date: 20201105
Implantable Lead Implant Date: 20201105
Implantable Lead Implant Date: 20210225
Implantable Lead Location: 753858
Implantable Lead Location: 753859
Implantable Lead Location: 753860
Implantable Lead Model: 3830
Implantable Pulse Generator Implant Date: 20201105
Lead Channel Impedance Value: 300 Ohm
Lead Channel Impedance Value: 390 Ohm
Lead Channel Impedance Value: 400 Ohm
Lead Channel Pacing Threshold Amplitude: 0.5 V
Lead Channel Pacing Threshold Amplitude: 0.75 V
Lead Channel Pacing Threshold Amplitude: 0.75 V
Lead Channel Pacing Threshold Pulse Width: 0.05 ms
Lead Channel Pacing Threshold Pulse Width: 0.5 ms
Lead Channel Pacing Threshold Pulse Width: 0.5 ms
Lead Channel Sensing Intrinsic Amplitude: 0.8 mV
Lead Channel Sensing Intrinsic Amplitude: 8.4 mV
Lead Channel Setting Pacing Amplitude: 0.25 V
Lead Channel Setting Pacing Amplitude: 2 V
Lead Channel Setting Pacing Amplitude: 2 V
Lead Channel Setting Pacing Pulse Width: 0.05 ms
Lead Channel Setting Pacing Pulse Width: 0.5 ms
Lead Channel Setting Sensing Sensitivity: 0.5 mV
Pulse Gen Serial Number: 9849338

## 2020-03-14 NOTE — Progress Notes (Signed)
Remote ICD transmission.   

## 2020-03-22 ENCOUNTER — Other Ambulatory Visit: Payer: Self-pay | Admitting: Family Medicine

## 2020-03-22 DIAGNOSIS — W19XXXA Unspecified fall, initial encounter: Secondary | ICD-10-CM

## 2020-03-24 ENCOUNTER — Telehealth: Payer: Self-pay | Admitting: Internal Medicine

## 2020-03-24 NOTE — Telephone Encounter (Signed)
-----   Message from Renato Shin, MD sent at 02/19/2020  3:36 PM EST ----- Regarding: RE: Transfer of Care OK  ----- Message ----- From: Rossie Muskrat Sent: 02/19/2020   2:47 PM EST To: Renato Shin, MD, # Subject: Transfer of Care                               Patient called and requested a transfer of care from Dr. Loanne Drilling to Dr. Kelton Pillar. She states she would be more comfortable with a female doctor. Please respond and advise.   Thank you!

## 2020-03-24 NOTE — Telephone Encounter (Signed)
02/19/20 approved for transfer of care to [facility]

## 2020-04-01 MED FILL — GABAPENTIN 300 MG CAPSULE: 300 | 30 days supply | Qty: 30 | Fill #1

## 2020-04-07 ENCOUNTER — Other Ambulatory Visit: Payer: Self-pay

## 2020-04-07 ENCOUNTER — Other Ambulatory Visit: Payer: Self-pay | Admitting: Internal Medicine

## 2020-04-07 MED ORDER — CARVEDILOL 25 MG PO TABS: 50.0000 mg | ORAL_TABLET | Freq: Two times a day (BID) | ORAL | 0 refills | Status: DC

## 2020-04-07 MED FILL — CARVEDILOL 25 MG TABLET: 25 | 90 days supply | Qty: 360 | Fill #0

## 2020-05-12 NOTE — Telephone Encounter (Signed)
Opened In error

## 2020-05-12 NOTE — Telephone Encounter (Signed)
Opened in error

## 2020-05-16 IMAGING — CR PORTABLE CHEST - 1 VIEW
1 series · 2 of 2 positions shown · non-contrast
Comparison: July 07, 2018

CLINICAL DATA: Shortness of breath. History of lung carcinoma.
Positive test for [id number]

EXAM:
PORTABLE CHEST 1 VIEW

[Series 1: portable · 0.17mm/px · 2 of 2 slices shown]
[im 1/2]
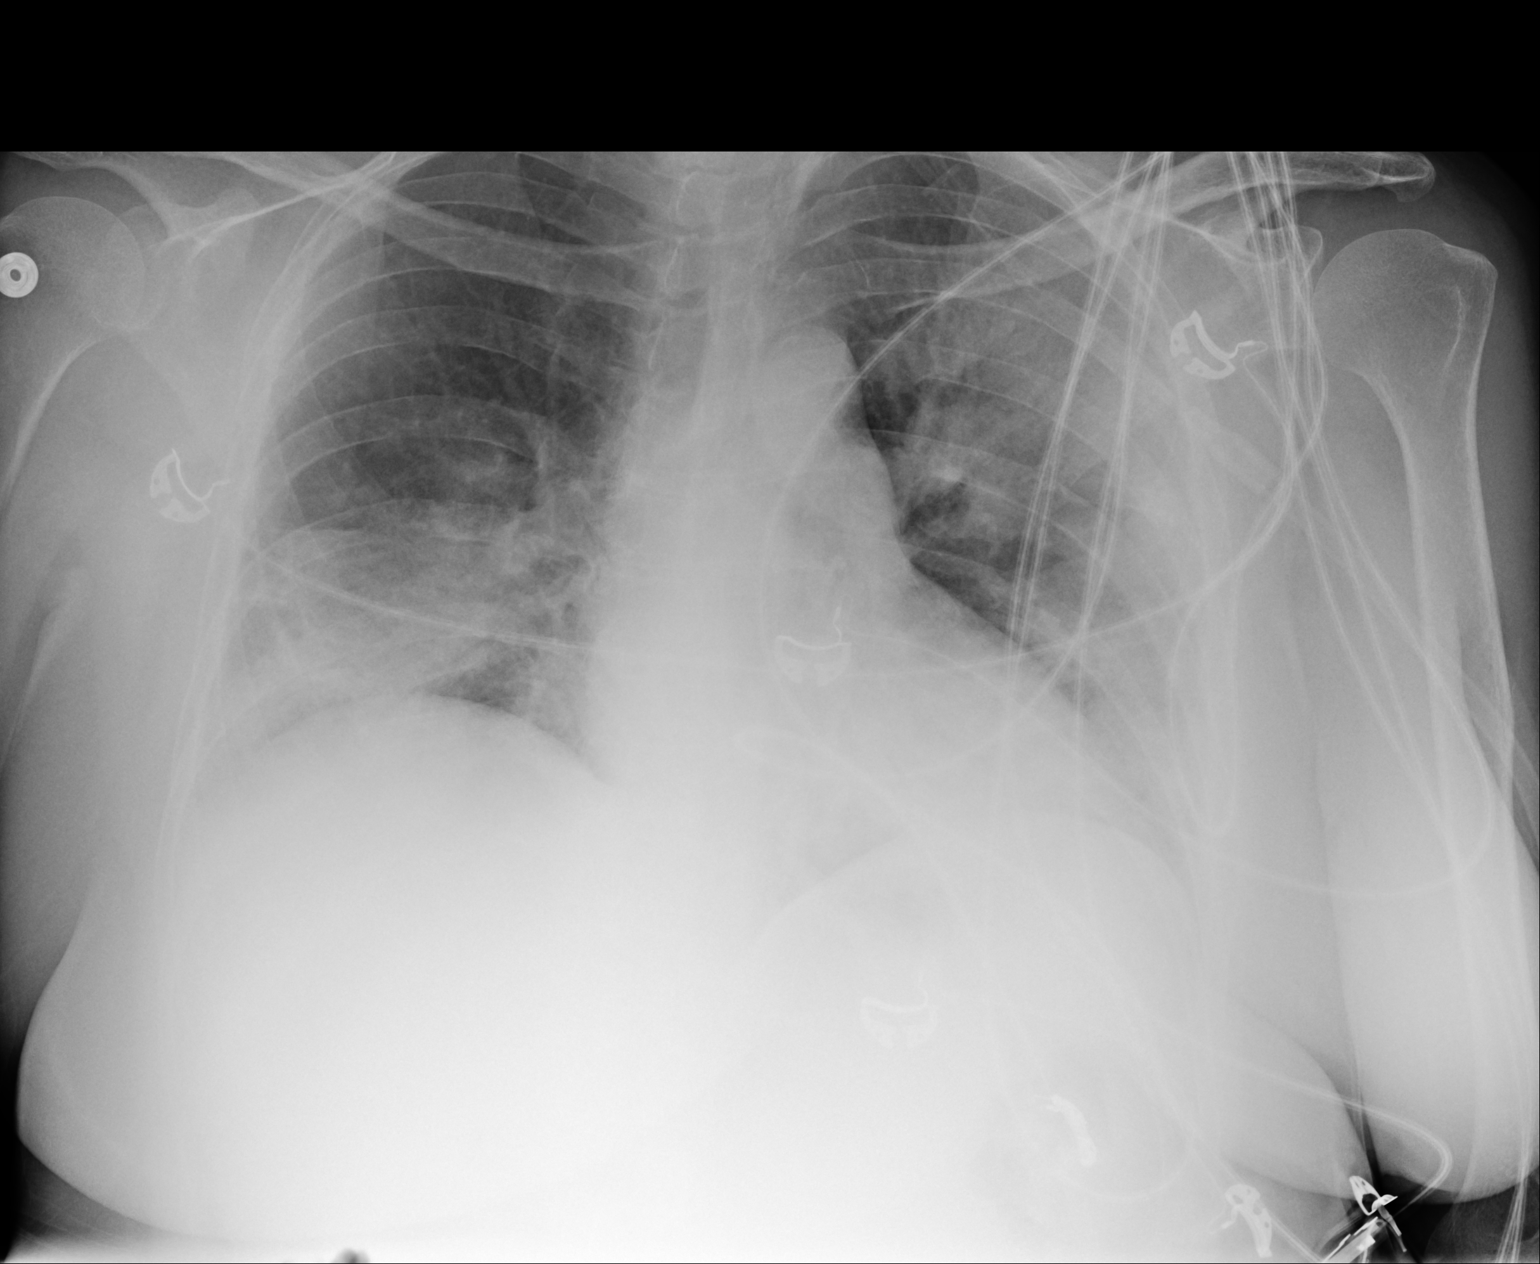
[im 2/2]
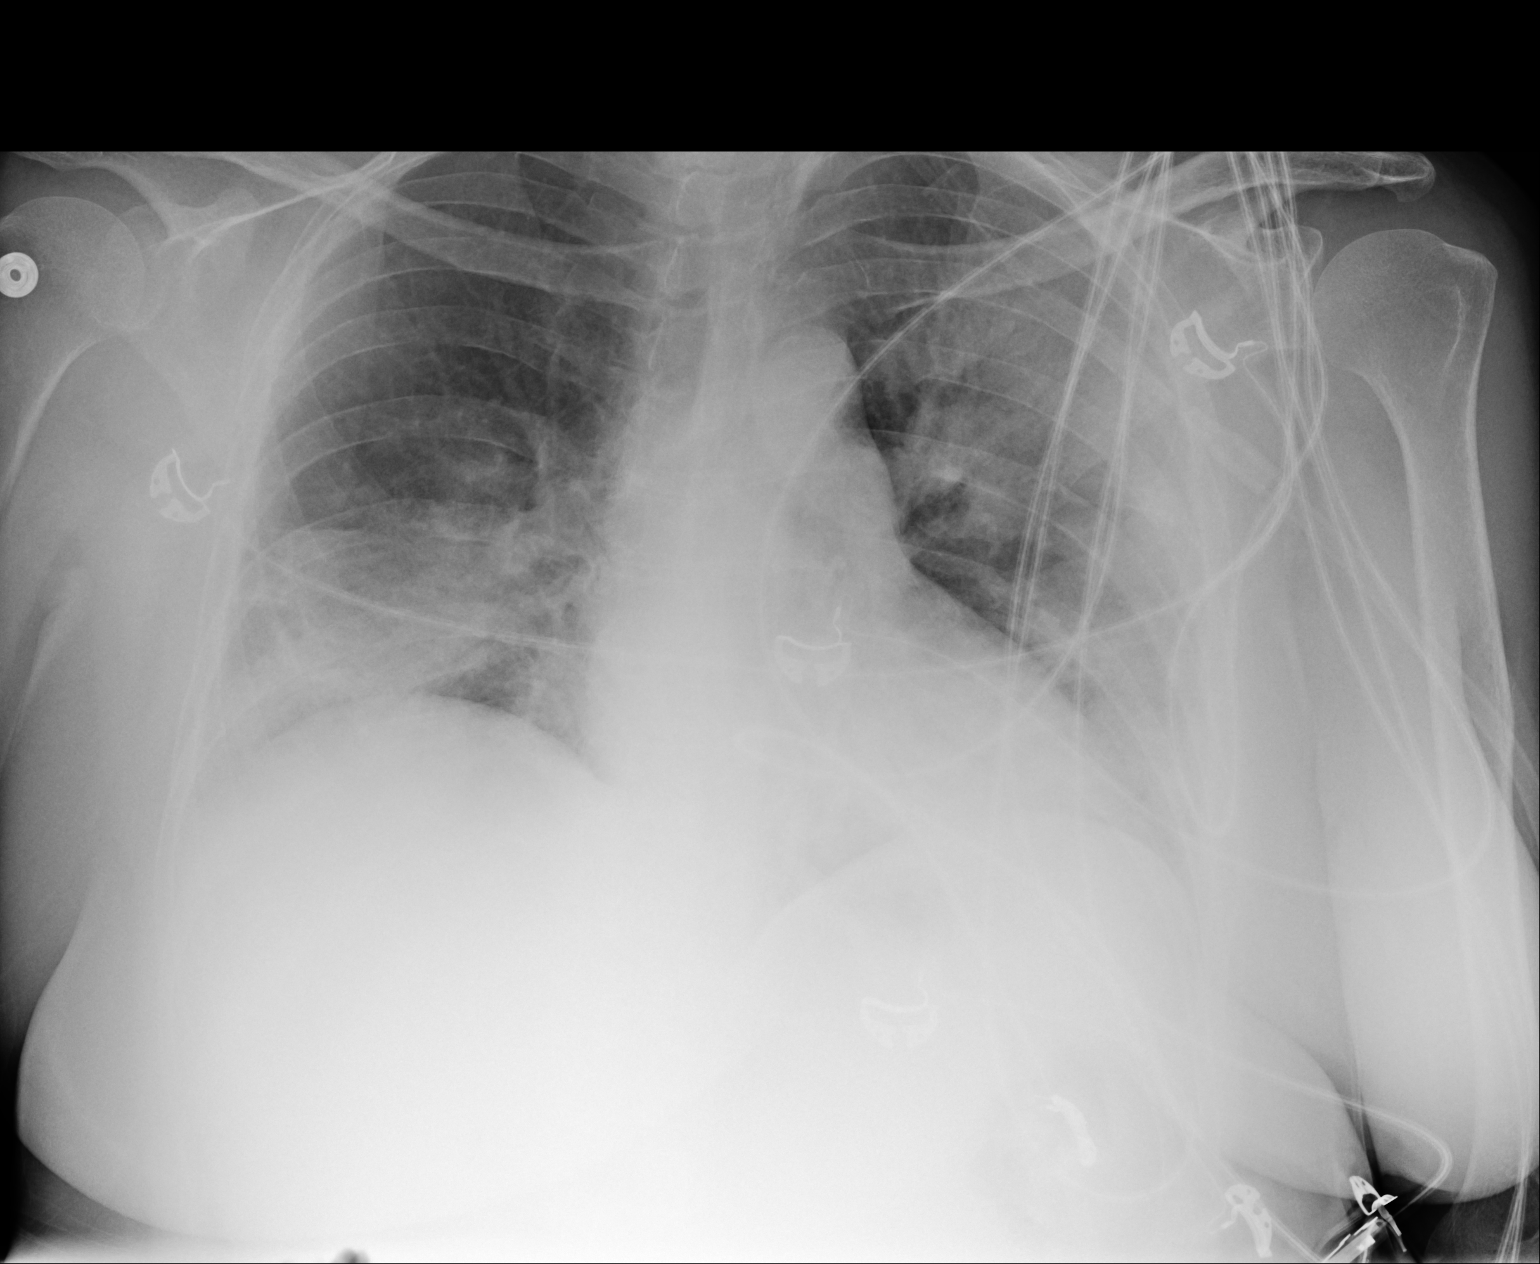

[2 of 2 positions shown; findings below may reference images not displayed]

FINDINGS: There is airspace consolidation in the right lower lobe. There is
more ill-defined airspace opacity in the left upper lobe and left
base regions. Heart size and pulmonary vascularity are normal. No
adenopathy. No bone lesions.
IMPRESSION: Multifocal pneumonia with ground-glass type airspace opacity
throughout much of the left lung. There is consolidation as well as
airspace opacity in the right lower lung region.

Stable cardiac silhouette.  No evident adenopathy.

## 2020-06-06 ENCOUNTER — Ambulatory Visit (INDEPENDENT_AMBULATORY_CARE_PROVIDER_SITE_OTHER): Payer: 59

## 2020-06-06 DIAGNOSIS — I447 Left bundle-branch block, unspecified: Secondary | ICD-10-CM

## 2020-06-07 LAB — CUP PACEART REMOTE DEVICE CHECK
Battery Remaining Longevity: 61 mo
Battery Remaining Percentage: 70 %
Battery Voltage: 2.98 V
Brady Statistic AP VP Percent: 1 %
Brady Statistic AP VS Percent: 1 %
Brady Statistic AS VP Percent: 99 %
Brady Statistic AS VS Percent: 1 %
Brady Statistic RA Percent Paced: 1 %
Date Time Interrogation Session: 20220510020015
HighPow Impedance: 71 Ohm
HighPow Impedance: 71 Ohm
Implantable Lead Implant Date: 20201105
Implantable Lead Implant Date: 20201105
Implantable Lead Implant Date: 20210225
Implantable Lead Location: 753858
Implantable Lead Location: 753859
Implantable Lead Location: 753860
Implantable Lead Model: 3830
Implantable Pulse Generator Implant Date: 20201105
Lead Channel Impedance Value: 300 Ohm
Lead Channel Impedance Value: 350 Ohm
Lead Channel Impedance Value: 360 Ohm
Lead Channel Pacing Threshold Amplitude: 0.5 V
Lead Channel Pacing Threshold Amplitude: 0.75 V
Lead Channel Pacing Threshold Amplitude: 0.75 V
Lead Channel Pacing Threshold Pulse Width: 0.05 ms
Lead Channel Pacing Threshold Pulse Width: 0.5 ms
Lead Channel Pacing Threshold Pulse Width: 0.5 ms
Lead Channel Sensing Intrinsic Amplitude: 3.1 mV
Lead Channel Sensing Intrinsic Amplitude: 8.4 mV
Lead Channel Setting Pacing Amplitude: 0.25 V
Lead Channel Setting Pacing Amplitude: 2 V
Lead Channel Setting Pacing Amplitude: 2 V
Lead Channel Setting Pacing Pulse Width: 0.05 ms
Lead Channel Setting Pacing Pulse Width: 0.5 ms
Lead Channel Setting Sensing Sensitivity: 0.5 mV
Pulse Gen Serial Number: 9849338

## 2020-06-15 ENCOUNTER — Other Ambulatory Visit: Payer: Self-pay | Admitting: Family Medicine

## 2020-06-15 ENCOUNTER — Other Ambulatory Visit (HOSPITAL_COMMUNITY): Payer: Self-pay

## 2020-06-15 DIAGNOSIS — E1149 Type 2 diabetes mellitus with other diabetic neurological complication: Secondary | ICD-10-CM

## 2020-06-15 MED ORDER — GABAPENTIN 300 MG PO CAPS
300.0000 mg | ORAL_CAPSULE | Freq: Every day | ORAL | 3 refills | Status: DC
  Filled 2020-06-15: qty 30, 30d supply, fill #0

## 2020-06-24 NOTE — Progress Notes (Signed)
Remote ICD transmission.   

## 2020-07-21 ENCOUNTER — Encounter: Payer: 59 | Admitting: Internal Medicine

## 2020-07-25 ENCOUNTER — Other Ambulatory Visit (HOSPITAL_COMMUNITY): Payer: Self-pay

## 2020-07-25 MED FILL — Insulin Lispro Soln Pen-injector 100 Unit/ML (1 Unit Dial): SUBCUTANEOUS | 90 days supply | Qty: 27 | Fill #0 | Status: CN

## 2020-07-25 MED FILL — Continuous Glucose System Sensor: 30 days supply | Qty: 3 | Fill #0 | Status: CN

## 2020-07-28 ENCOUNTER — Other Ambulatory Visit (HOSPITAL_COMMUNITY): Payer: Self-pay

## 2020-08-02 ENCOUNTER — Other Ambulatory Visit (HOSPITAL_COMMUNITY): Payer: Self-pay

## 2020-08-03 ENCOUNTER — Other Ambulatory Visit (HOSPITAL_COMMUNITY): Payer: Self-pay

## 2020-08-08 ENCOUNTER — Other Ambulatory Visit (HOSPITAL_COMMUNITY): Payer: Self-pay

## 2020-08-08 MED FILL — Insulin Lispro Soln Pen-injector 100 Unit/ML (1 Unit Dial): SUBCUTANEOUS | 90 days supply | Qty: 30 | Fill #0 | Status: AC

## 2020-08-09 ENCOUNTER — Other Ambulatory Visit (HOSPITAL_COMMUNITY): Payer: Self-pay

## 2020-08-16 ENCOUNTER — Ambulatory Visit: Payer: 59 | Admitting: Cardiovascular Disease

## 2020-09-05 ENCOUNTER — Ambulatory Visit (INDEPENDENT_AMBULATORY_CARE_PROVIDER_SITE_OTHER): Payer: 59

## 2020-09-05 DIAGNOSIS — I5023 Acute on chronic systolic (congestive) heart failure: Secondary | ICD-10-CM

## 2020-09-05 DIAGNOSIS — I447 Left bundle-branch block, unspecified: Secondary | ICD-10-CM

## 2020-09-07 LAB — CUP PACEART REMOTE DEVICE CHECK
Battery Remaining Longevity: 56 mo
Battery Remaining Percentage: 67 %
Battery Voltage: 2.96 V
Brady Statistic AP VP Percent: 1 %
Brady Statistic AP VS Percent: 1 %
Brady Statistic AS VP Percent: 99 %
Brady Statistic AS VS Percent: 1 %
Brady Statistic RA Percent Paced: 1 %
Date Time Interrogation Session: 20220809020017
HighPow Impedance: 68 Ohm
HighPow Impedance: 68 Ohm
Implantable Lead Implant Date: 20201105
Implantable Lead Implant Date: 20201105
Implantable Lead Implant Date: 20210225
Implantable Lead Location: 753858
Implantable Lead Location: 753859
Implantable Lead Location: 753860
Implantable Lead Model: 3830
Implantable Pulse Generator Implant Date: 20201105
Lead Channel Impedance Value: 310 Ohm
Lead Channel Impedance Value: 360 Ohm
Lead Channel Impedance Value: 360 Ohm
Lead Channel Pacing Threshold Amplitude: 0.5 V
Lead Channel Pacing Threshold Amplitude: 0.75 V
Lead Channel Pacing Threshold Amplitude: 0.75 V
Lead Channel Pacing Threshold Pulse Width: 0.05 ms
Lead Channel Pacing Threshold Pulse Width: 0.5 ms
Lead Channel Pacing Threshold Pulse Width: 0.5 ms
Lead Channel Sensing Intrinsic Amplitude: 3.6 mV
Lead Channel Sensing Intrinsic Amplitude: 8.4 mV
Lead Channel Setting Pacing Amplitude: 0.25 V
Lead Channel Setting Pacing Amplitude: 2 V
Lead Channel Setting Pacing Amplitude: 2 V
Lead Channel Setting Pacing Pulse Width: 0.05 ms
Lead Channel Setting Pacing Pulse Width: 0.5 ms
Lead Channel Setting Sensing Sensitivity: 0.5 mV
Pulse Gen Serial Number: 9849338

## 2020-09-29 NOTE — Progress Notes (Signed)
Remote ICD transmission.   

## 2020-09-30 ENCOUNTER — Encounter: Payer: 59 | Admitting: Family Medicine

## 2020-10-11 IMAGING — CR DG CHEST 2V
2 series · 2 of 2 positions shown · non-contrast
Comparison: Chest x-ray dated July 31, 2018.

CLINICAL DATA: Postop ICD placement.

EXAM:
CHEST - 2 VIEW

[w chest pa]
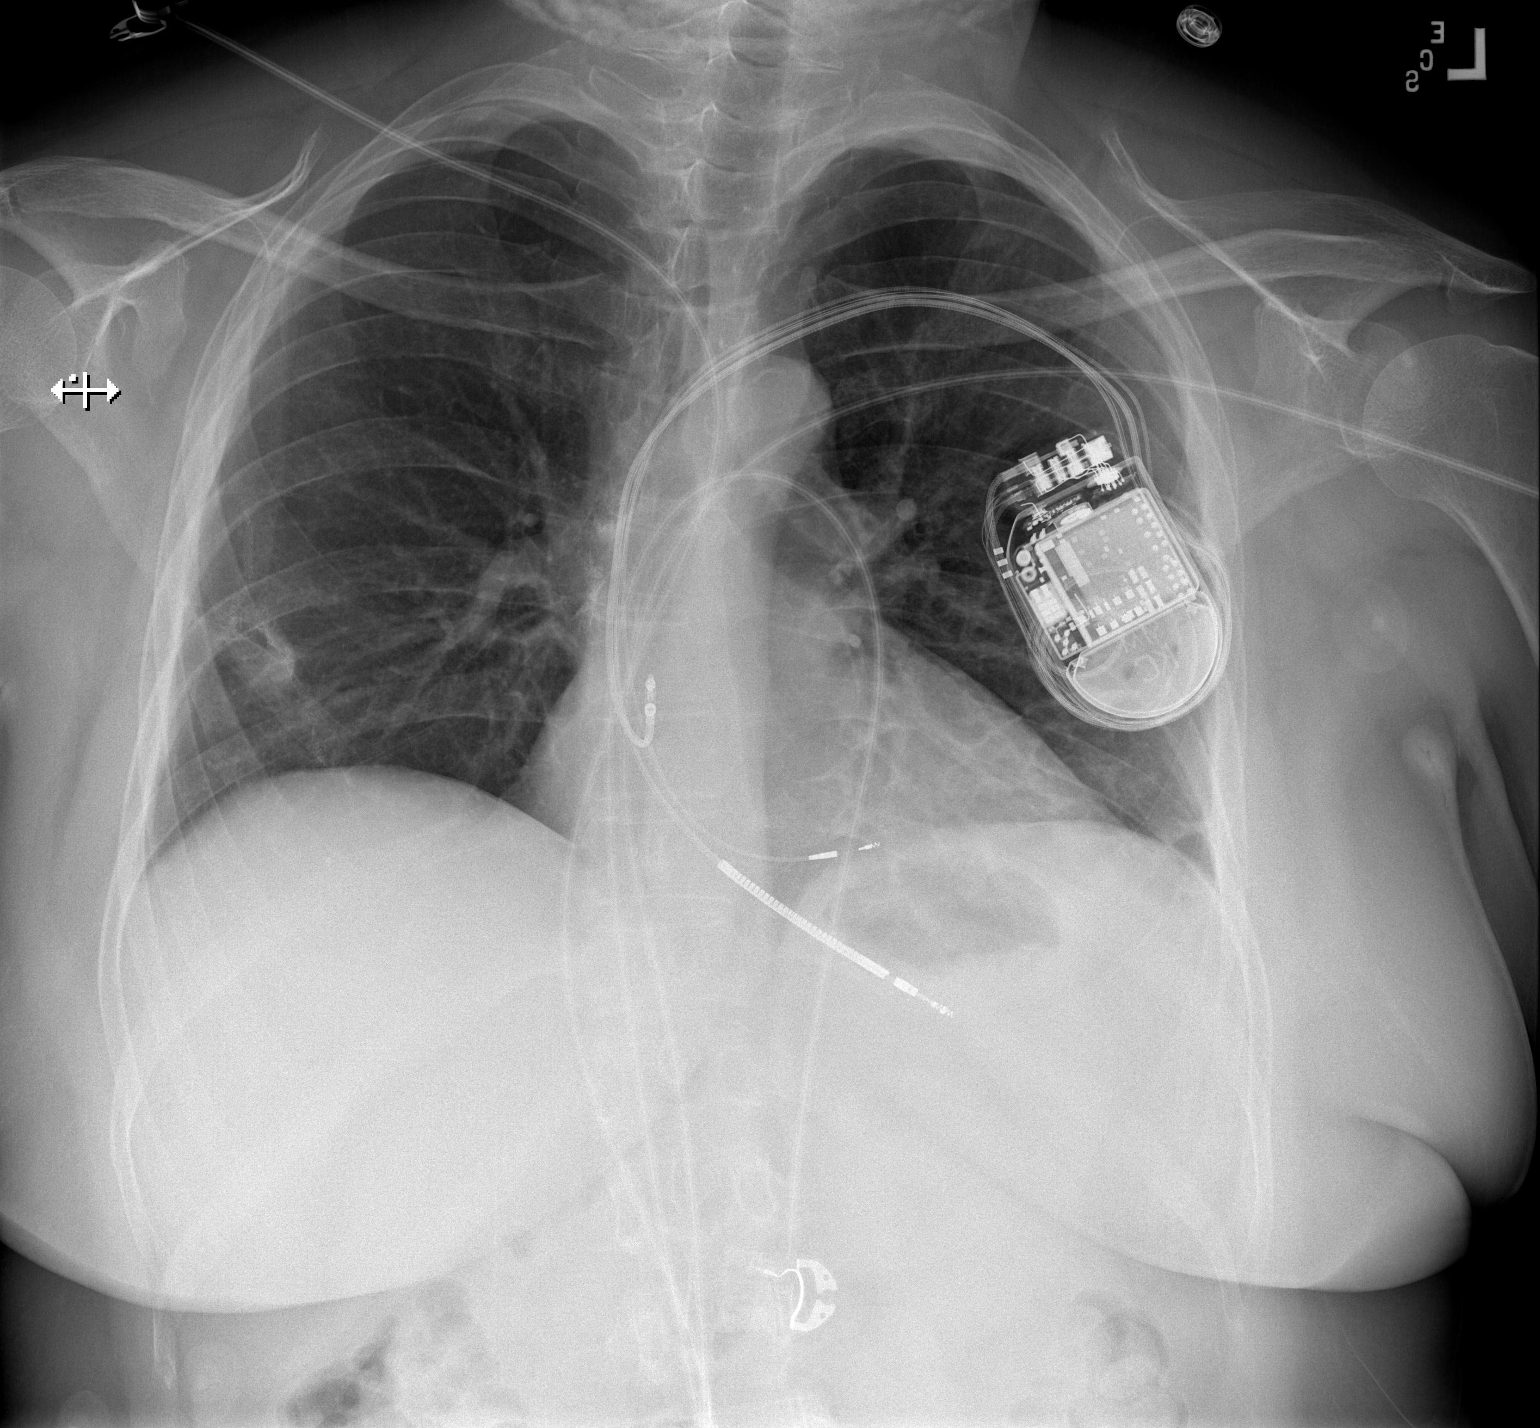

[w chest lat]
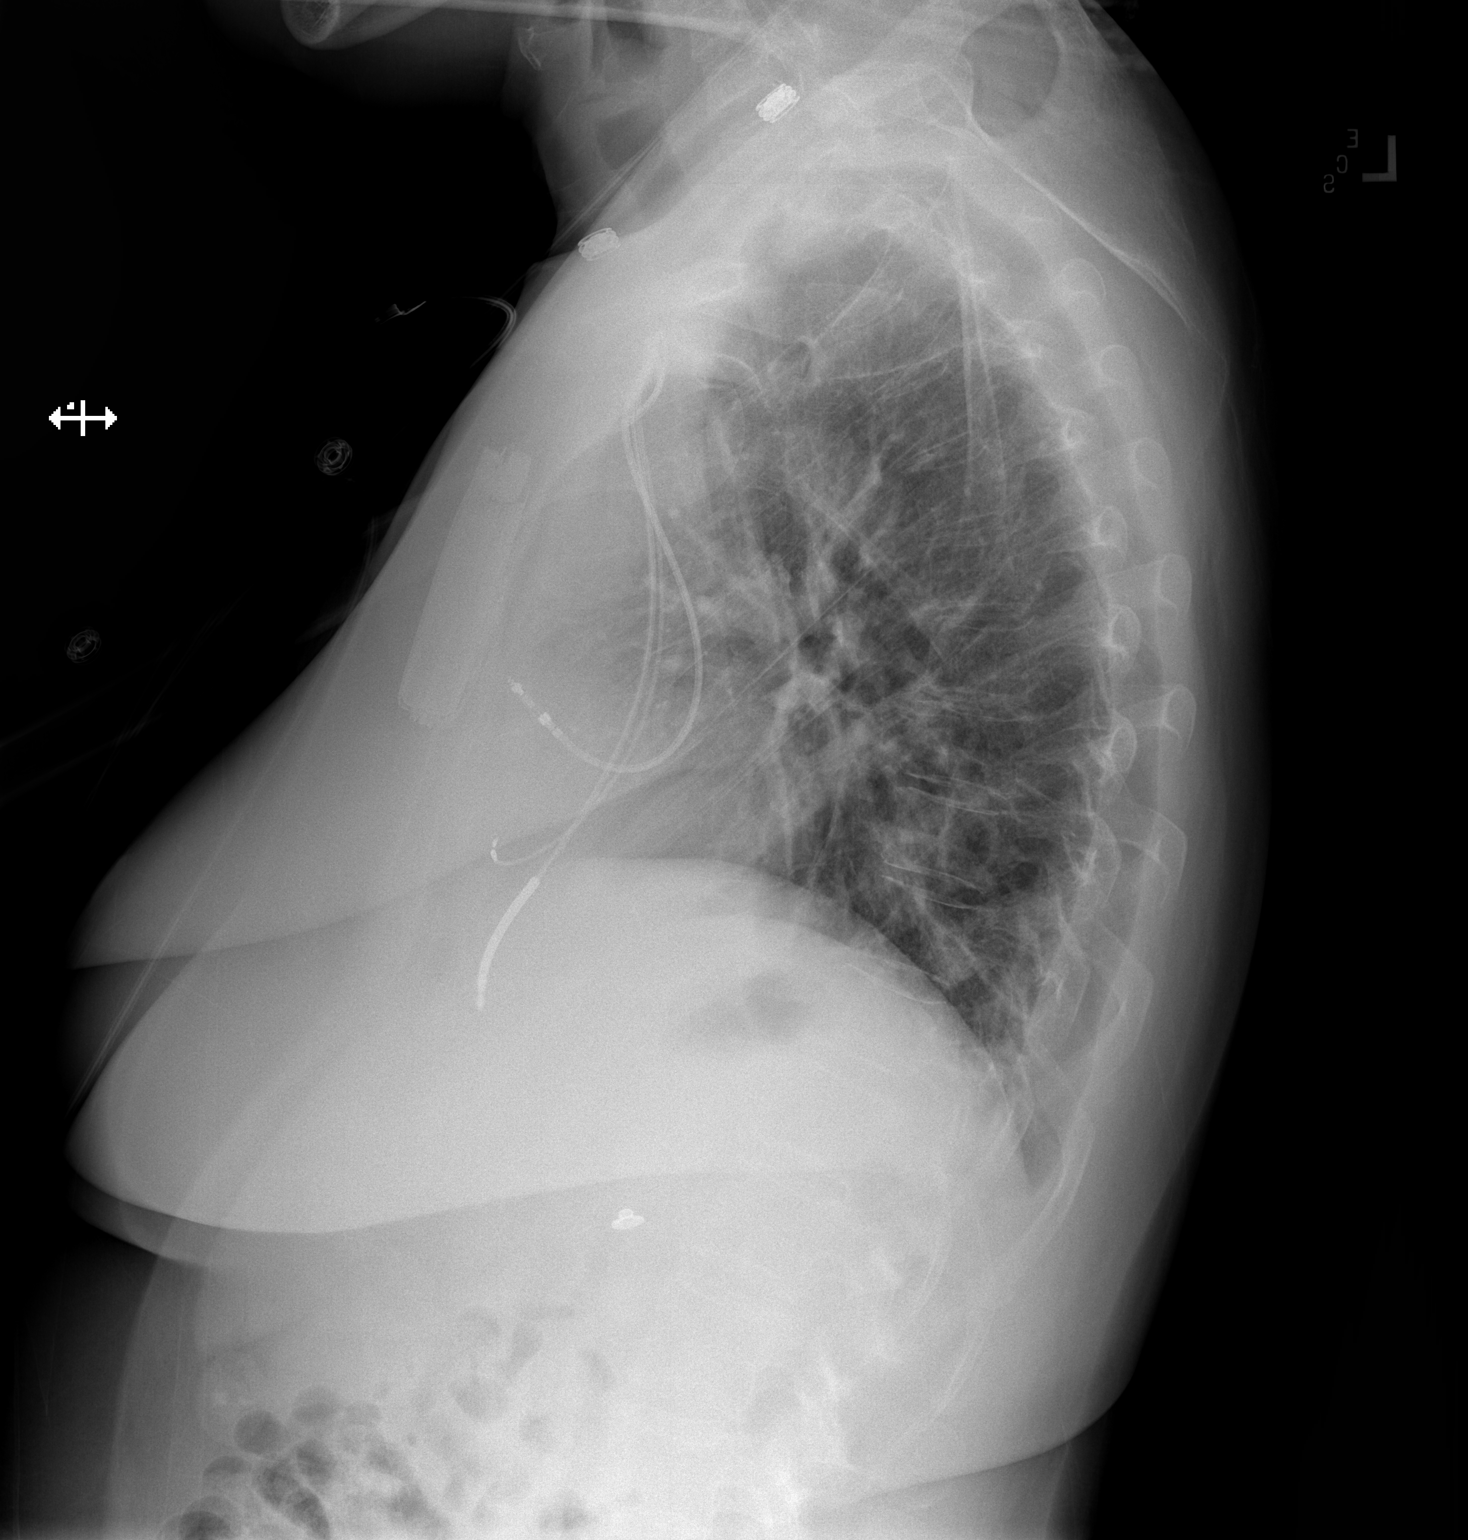

[2 of 2 positions shown; findings below may reference images not displayed]

FINDINGS: New left chest wall AICD with leads terminating in the right atrium
and right ventricle. The heart size and mediastinal contours are
within normal limits. Normal pulmonary vascularity. Postsurgical
changes and scarring at the right lung base are again noted. No
focal consolidation, pleural effusion, or pneumothorax. No acute
osseous abnormality.
IMPRESSION: New left chest wall AICD without complicating feature.

## 2020-10-30 ENCOUNTER — Encounter: Payer: Self-pay | Admitting: Cardiovascular Disease

## 2020-10-30 NOTE — Progress Notes (Signed)
This encounter was created in error - please disregard.

## 2020-11-01 ENCOUNTER — Encounter: Payer: 59 | Admitting: Cardiovascular Disease

## 2020-11-10 ENCOUNTER — Encounter: Payer: Self-pay | Admitting: Family

## 2020-11-10 ENCOUNTER — Other Ambulatory Visit (HOSPITAL_COMMUNITY): Payer: Self-pay

## 2020-11-10 ENCOUNTER — Telehealth: Payer: Self-pay

## 2020-11-10 ENCOUNTER — Ambulatory Visit: Payer: 59 | Admitting: Family

## 2020-11-10 ENCOUNTER — Other Ambulatory Visit: Payer: Self-pay

## 2020-11-10 DIAGNOSIS — R300 Dysuria: Secondary | ICD-10-CM | POA: Diagnosis not present

## 2020-11-10 DIAGNOSIS — N3090 Cystitis, unspecified without hematuria: Secondary | ICD-10-CM | POA: Diagnosis not present

## 2020-11-10 LAB — POCT URINALYSIS DIPSTICK
Bilirubin, UA: NEGATIVE
Blood, UA: POSITIVE
Glucose, UA: POSITIVE — AB
Ketones, UA: NEGATIVE
Nitrite, UA: POSITIVE
Protein, UA: POSITIVE — AB
Spec Grav, UA: 1.015 (ref 1.010–1.025)
Urobilinogen, UA: 1 E.U./dL
pH, UA: 6 (ref 5.0–8.0)

## 2020-11-10 MED ORDER — NITROFURANTOIN MONOHYD MACRO 100 MG PO CAPS: 100.0000 mg | ORAL_CAPSULE | Freq: Two times a day (BID) | ORAL | Status: DC

## 2020-11-10 MED ORDER — NITROFURANTOIN MONOHYD MACRO 100 MG PO CAPS
100.0000 mg | ORAL_CAPSULE | Freq: Two times a day (BID) | ORAL | 0 refills | Status: AC
  Filled 2020-11-10: qty 14, 7d supply, fill #0

## 2020-11-10 NOTE — Telephone Encounter (Signed)
Pharmacy called patient went to pick up antibotics that was suppose to be sent in. Medication hasnt been sent in. Please advise.

## 2020-11-10 NOTE — Assessment & Plan Note (Signed)
Pt on Entresto. Sending Macrobid & ordering culture.

## 2020-11-10 NOTE — Progress Notes (Signed)
Subjective:     Patient ID: Crystal Bryant, female    DOB: 1970/10/20, 50 y.o.   MRN: [id number]  Chief Complaint  Patient presents with   Back Pain   Dysuria    Symptoms started last week. She has taken Tylenol for pain, but has not subsided. She has even tried Goody's powder, but has not helped. She denies vomiting, but has mild nausea.    Urine Odor     HPI Urinary Tract Infection: Patient complains of burning with urination and bilateral flank pain She has had symptoms for 7 day. Patient also complains of back pain. Patient denies fever and vaginal discharge. Patient does not have a history of recurrent UTI.  Patient does not have a history of pyelonephritis.     Past Medical History:  Diagnosis Date   Anxiety    CHF (congestive heart failure) ([place])    Diabetes mellitus without complication ([place])    Hypertension    Lung cancer, upper lobe ([place]) 09/2015   Non-small cell in right upper lobe treated with RULectomy by VATS at [facility]    Past Surgical History:  Procedure Laterality Date   ABDOMINAL HYSTERECTOMY     BIV ICD INSERTION CRT-D N/A 12/04/2018   Procedure: BIV ICD INSERTION CRT-D;  Surgeon: Evans Lance, MD;  Location: [place] [facility];  Service: Cardiovascular;  Laterality: N/A;   BREAST BIOPSY Right 02/10/2018   Biopsy of upper outer quadrant of right breast due to calcifications seen on mammography revealed fat necrosis with calcifications.  Benign so continue annual screening mammograms.   BREAST SURGERY     CESAREAN SECTION     LEAD REVISION/REPAIR N/A 03/26/2019   Procedure: LEAD REVISION/REPAIR;  Surgeon: Evans Lance, MD;  Location: [facility];  Service: Cardiovascular;  Laterality: N/A;   LOBECTOMY Right 10/06/2015   [place] lung cancer treated with VATS right upper lobectomy by Dr. [name] at [facility].    Outpatient Medications Prior to Visit  Medication Sig Dispense Refill   amLODipine (NORVASC) 5 MG tablet TAKE 1 TABLET (5 MG TOTAL) BY MOUTH  DAILY. 30 tablet 3   Blood Glucose Monitoring Suppl (FREESTYLE LITE) DEVI Use As Directed 1 Device 0   carvedilol (COREG) 25 MG tablet TAKE 2 TABLETS BY MOUTH TWICE A DAY WITH A MEAL 360 tablet 0   Continuous Blood Gluc Receiver (DEXCOM G5 RECEIVER KIT) DEVI Use daily to check blood sugar.  DXE11.9 1 each 0   Continuous Blood Gluc Receiver (DEXCOM G6 RECEIVER) DEVI USE DAILY TO CHECK BLOOD SUGAR. 1 each 0   Continuous Blood Gluc Sensor (DEXCOM G6 SENSOR) MISC USE DAILY TO CHECK BLOOD SUGAR 3 each 12   Continuous Blood Gluc Transmit (DEXCOM G6 TRANSMITTER) MISC USE TO CHECK BLOOD SUGAR. 1 each 0   gabapentin (NEURONTIN) 300 MG capsule Take 2 capsules (600 mg total) by mouth at bedtime. 30 capsule 3   gabapentin (NEURONTIN) 300 MG capsule Take 1 capsule (300 mg total) by mouth at bedtime. 30 capsule 3   glucose blood (FREESTYLE LITE) test strip Use as instructed 100 each 1   hydrOXYzine (ATARAX/VISTARIL) 10 MG tablet TAKE 1 TO 2 TABLETS BY MOUTH EVERY 8 HOURS AS NEEDED FOR ANXIETY. 30 tablet 0   insulin detemir (LEVEMIR) 100 UNIT/ML FlexPen Inject 20 Units into the skin at bedtime. 15 mL 11   Insulin Glargine (BASAGLAR KWIKPEN) 100 UNIT/ML INJECT 10 UNITS AT BEDTIME 15 mL 5   insulin lispro (HUMALOG) 100 UNIT/ML KwikPen  INJECT 10 UNITS INTO THE SKIN 3 (THREE) TIMES DAILY WITH MEALS. 30 mL 3   Insulin Pen Needle 31G X 6 MM MISC USE AS DIRECTED 4 TIMES DAILY 400 each 3   Lancets (FREESTYLE) lancets Use as instructed 100 each 1   metFORMIN (GLUCOPHAGE-XR) 500 MG 24 hr tablet TAKE 1 TABLET (500 MG TOTAL) BY MOUTH DAILY WITH BREAKFAST. 30 tablet 1   potassium chloride 20 MEQ/15ML (10%) SOLN Take 7.5 mLs (10 mEq total) by mouth daily. 473 mL 2   pravastatin (PRAVACHOL) 10 MG tablet TAKE 1 TABLET (10 MG TOTAL) BY MOUTH DAILY. 30 tablet 3   sacubitril-valsartan (ENTRESTO) 97-103 MG Take 1 tablet by mouth 2 (two) times daily. 60 tablet 11   torsemide (DEMADEX) 20 MG tablet TAKE 1 TABLET (20 MG TOTAL) BY  MOUTH DAILY. 90 tablet 3   venlafaxine XR (EFFEXOR-XR) 37.5 MG 24 hr capsule TAKE 1 CAPSULE (37.5 MG TOTAL) BY MOUTH DAILY WITH BREAKFAST. 30 capsule 2   Facility-Administered Medications Prior to Visit  Medication Dose Route Frequency Provider Last Rate Last Admin   ivabradine (CORLANOR) tablet 5 mg  5 mg Oral BID WC Nahser, Wonda Cheng, MD        No Known Allergies      Objective:    Physical Exam Vitals and nursing note reviewed.  Constitutional:      Appearance: Normal appearance.  Cardiovascular:     Rate and Rhythm: Normal rate and regular rhythm.  Pulmonary:     Effort: Pulmonary effort is normal.     Breath sounds: Normal breath sounds.  Musculoskeletal:        General: Normal range of motion.  Skin:    General: Skin is warm and dry.  Neurological:     Mental Status: She is alert.  Psychiatric:        Mood and Affect: Mood normal.        Behavior: Behavior normal.    BP 130/80   Pulse 96   Temp 98 F (36.7 C) (Temporal)   Ht _0  (1.651 m)   Wt 149 lb (67.6 kg)   SpO2 96%   BMI 24.79 kg/m  Wt Readings from Last 3 Encounters:  11/10/20 149 lb (67.6 kg)  02/16/20 149 lb (67.6 kg)  12/31/19 144 lb (65.3 kg)       Assessment & Plan:   Problem List Items Addressed This Visit       Genitourinary   Cystitis    Pt on Entresto. Sending Macrobid & ordering culture.      Relevant Medications   nitrofurantoin (macrocrystal-monohydrate) (MACROBID) capsule 100 mg (Start on 11/10/2020 11:45 AM)   Other Relevant Orders   POCT Urinalysis Dipstick (Completed)   Urine Culture    I am having [facility] maintain her Entresto, FREESTYLE LITE, [employer], freestyle, potassium chloride, torsemide, Dexcom G5 Receiver Kit, gabapentin, insulin detemir, carvedilol, venlafaxine XR, Insulin Pen Needle, insulin lispro, Dexcom G6 Sensor, Basaglar KwikPen, pravastatin, metFORMIN, amLODipine, hydrOXYzine, Dexcom G6 Receiver, Dexcom G6 Transmitter, and gabapentin. We  will continue to administer ivabradine and nitrofurantoin (macrocrystal-monohydrate).  Meds ordered this encounter  Medications   nitrofurantoin (macrocrystal-monohydrate) (MACROBID) capsule 100 mg

## 2020-11-10 NOTE — Patient Instructions (Signed)
An antibiotic has been sent to your pharmacy, start today. We will call you if the urine culture indicates a different antibiotic to be used. Drink at least 2 liters of water daily.

## 2020-11-10 NOTE — Telephone Encounter (Signed)
Looks like it was sent to the [facility] by the treated NP. Ty.

## 2020-11-10 NOTE — Addendum Note (Signed)
Addended byJeanie Sewer on: 11/10/2020 03:29 PM   Modules accepted: Orders

## 2020-11-11 ENCOUNTER — Other Ambulatory Visit (HOSPITAL_COMMUNITY): Payer: Self-pay

## 2020-11-11 NOTE — Telephone Encounter (Signed)
Patient was seen at [facility] [place] yesterday and she stated that the antibiotic was not sent in.  She waited a while for this.  She is now having nausea and flank pain.  She would would like to see if something can be sent in for that.  I also called the pharmacy and they do have the antibiotic.

## 2020-11-12 LAB — URINE CULTURE
MICRO NUMBER:: 12498915
SPECIMEN QUALITY:: ADEQUATE

## 2020-11-13 ENCOUNTER — Encounter: Payer: Self-pay | Admitting: Family

## 2020-11-15 ENCOUNTER — Encounter: Payer: 59 | Admitting: Family Medicine

## 2020-11-24 ENCOUNTER — Other Ambulatory Visit (HOSPITAL_COMMUNITY): Payer: Self-pay

## 2020-12-05 ENCOUNTER — Ambulatory Visit (INDEPENDENT_AMBULATORY_CARE_PROVIDER_SITE_OTHER): Payer: 59

## 2020-12-05 DIAGNOSIS — I447 Left bundle-branch block, unspecified: Secondary | ICD-10-CM | POA: Diagnosis not present

## 2020-12-06 LAB — CUP PACEART REMOTE DEVICE CHECK
Battery Remaining Longevity: 56 mo
Battery Remaining Percentage: 64 %
Battery Voltage: 2.96 V
Brady Statistic AP VP Percent: 1 %
Brady Statistic AP VS Percent: 1 %
Brady Statistic AS VP Percent: 99 %
Brady Statistic AS VS Percent: 1 %
Brady Statistic RA Percent Paced: 1 %
Date Time Interrogation Session: 20221107014433
HighPow Impedance: 66 Ohm
HighPow Impedance: 66 Ohm
Implantable Lead Implant Date: 20201105
Implantable Lead Implant Date: 20201105
Implantable Lead Implant Date: 20210225
Implantable Lead Location: 753858
Implantable Lead Location: 753859
Implantable Lead Location: 753860
Implantable Lead Model: 3830
Implantable Pulse Generator Implant Date: 20201105
Lead Channel Impedance Value: 310 Ohm
Lead Channel Impedance Value: 380 Ohm
Lead Channel Impedance Value: 400 Ohm
Lead Channel Pacing Threshold Amplitude: 0.5 V
Lead Channel Pacing Threshold Amplitude: 0.75 V
Lead Channel Pacing Threshold Amplitude: 0.75 V
Lead Channel Pacing Threshold Pulse Width: 0.05 ms
Lead Channel Pacing Threshold Pulse Width: 0.5 ms
Lead Channel Pacing Threshold Pulse Width: 0.5 ms
Lead Channel Sensing Intrinsic Amplitude: 0.8 mV
Lead Channel Sensing Intrinsic Amplitude: 8.4 mV
Lead Channel Setting Pacing Amplitude: 0.25 V
Lead Channel Setting Pacing Amplitude: 2 V
Lead Channel Setting Pacing Amplitude: 2 V
Lead Channel Setting Pacing Pulse Width: 0.05 ms
Lead Channel Setting Pacing Pulse Width: 0.5 ms
Lead Channel Setting Sensing Sensitivity: 0.5 mV
Pulse Gen Serial Number: 9849338

## 2020-12-08 ENCOUNTER — Telehealth: Payer: Self-pay | Admitting: *Deleted

## 2020-12-08 NOTE — Telephone Encounter (Signed)
Prior auth started via cover my meds.  Awaiting determination.  Key: [id number] Sensor Key: [id number]

## 2020-12-09 NOTE — Progress Notes (Signed)
Remote ICD transmission.   

## 2020-12-13 ENCOUNTER — Telehealth: Payer: Self-pay | Admitting: *Deleted

## 2020-12-13 NOTE — Telephone Encounter (Signed)
Prior auth started via cover my meds.  Awaiting determination.  Key: [id number]

## 2020-12-13 NOTE — Telephone Encounter (Signed)
Both transmitter and sensors are approved.  The request has been approved. The authorization is effective for a maximum of 4 fills from 12/09/2020 to 12/08/2021,

## 2020-12-15 NOTE — Telephone Encounter (Signed)
Prior Crystal Bryant was cancelled.  Insurance will cover I believe maybe once a year.

## 2020-12-18 ENCOUNTER — Emergency Department (HOSPITAL_COMMUNITY): Payer: 59

## 2020-12-18 ENCOUNTER — Observation Stay (HOSPITAL_COMMUNITY)
Admission: EM | Admit: 2020-12-18 | Discharge: 2020-12-20 | Disposition: A | Discharge status: Home / Self Care | Payer: 59 | Attending: Family Medicine | Admitting: Family Medicine

## 2020-12-18 ENCOUNTER — Encounter (HOSPITAL_COMMUNITY): Payer: Self-pay | Admitting: Emergency Medicine

## 2020-12-18 ENCOUNTER — Other Ambulatory Visit: Payer: Self-pay

## 2020-12-18 DIAGNOSIS — Z85118 Personal history of other malignant neoplasm of bronchus and lung: Secondary | ICD-10-CM | POA: Diagnosis not present

## 2020-12-18 DIAGNOSIS — I509 Heart failure, unspecified: Secondary | ICD-10-CM | POA: Insufficient documentation

## 2020-12-18 DIAGNOSIS — Z20822 Contact with and (suspected) exposure to covid-19: Secondary | ICD-10-CM | POA: Diagnosis not present

## 2020-12-18 DIAGNOSIS — E119 Type 2 diabetes mellitus without complications: Secondary | ICD-10-CM

## 2020-12-18 DIAGNOSIS — E1169 Type 2 diabetes mellitus with other specified complication: Secondary | ICD-10-CM

## 2020-12-18 DIAGNOSIS — Z95810 Presence of automatic (implantable) cardiac defibrillator: Secondary | ICD-10-CM | POA: Diagnosis not present

## 2020-12-18 DIAGNOSIS — I5022 Chronic systolic (congestive) heart failure: Secondary | ICD-10-CM

## 2020-12-18 DIAGNOSIS — I160 Hypertensive urgency: Principal | ICD-10-CM | POA: Diagnosis present

## 2020-12-18 DIAGNOSIS — I110 Hypertensive heart disease with heart failure: Secondary | ICD-10-CM | POA: Diagnosis not present

## 2020-12-18 DIAGNOSIS — R519 Headache, unspecified: Secondary | ICD-10-CM | POA: Diagnosis not present

## 2020-12-18 DIAGNOSIS — Z79899 Other long term (current) drug therapy: Secondary | ICD-10-CM | POA: Diagnosis not present

## 2020-12-18 DIAGNOSIS — I10 Essential (primary) hypertension: Secondary | ICD-10-CM | POA: Diagnosis present

## 2020-12-18 DIAGNOSIS — Z794 Long term (current) use of insulin: Secondary | ICD-10-CM | POA: Diagnosis not present

## 2020-12-18 DIAGNOSIS — I169 Hypertensive crisis, unspecified: Secondary | ICD-10-CM | POA: Diagnosis present

## 2020-12-18 DIAGNOSIS — I447 Left bundle-branch block, unspecified: Secondary | ICD-10-CM | POA: Diagnosis present

## 2020-12-18 DIAGNOSIS — Z7984 Long term (current) use of oral hypoglycemic drugs: Secondary | ICD-10-CM | POA: Insufficient documentation

## 2020-12-18 DIAGNOSIS — R079 Chest pain, unspecified: Secondary | ICD-10-CM | POA: Diagnosis not present

## 2020-12-18 LAB — CBC
HCT: 39.4 % (ref 36.0–46.0)
Hemoglobin: 13.1 g/dL (ref 12.0–15.0)
MCH: 28.2 pg (ref 26.0–34.0)
MCHC: 33.2 g/dL (ref 30.0–36.0)
MCV: 84.9 fL (ref 80.0–100.0)
Platelets: 337 10*3/uL (ref 150–400)
RBC: 4.64 MIL/uL (ref 3.87–5.11)
RDW: 12.5 % (ref 11.5–15.5)
WBC: 6.6 10*3/uL (ref 4.0–10.5)
nRBC: 0 % (ref 0.0–0.2)

## 2020-12-18 LAB — RESP PANEL BY RT-PCR (FLU A&B, COVID) ARPGX2
Influenza A by PCR: NEGATIVE
Influenza B by PCR: NEGATIVE
SARS Coronavirus 2 by RT PCR: NEGATIVE

## 2020-12-18 LAB — BASIC METABOLIC PANEL
Anion gap: 8 (ref 5–15)
BUN: 13 mg/dL (ref 6–20)
CO2: 27 mmol/L (ref 22–32)
Calcium: 9.5 mg/dL (ref 8.9–10.3)
Chloride: 102 mmol/L (ref 98–111)
Creatinine, Ser: 0.89 mg/dL (ref 0.44–1.00)
GFR, Estimated: >60 mL/min (ref >60)
Glucose, Bld: 146 mg/dL — ABNORMAL HIGH (ref 70–99)
Potassium: 3.5 mmol/L (ref 3.5–5.1)
Sodium: 137 mmol/L (ref 135–145)

## 2020-12-18 LAB — BRAIN NATRIURETIC PEPTIDE: B Natriuretic Peptide: 29 pg/mL (ref 0.0–100.0)

## 2020-12-18 LAB — TROPONIN I (HIGH SENSITIVITY)
Troponin I (High Sensitivity): 9 ng/L (ref <18)
Troponin I (High Sensitivity): 9 ng/L (ref <18)

## 2020-12-18 MED ORDER — HYDRALAZINE HCL 20 MG/ML IJ SOLN
10.0000 mg | Freq: Once | INTRAMUSCULAR | Status: AC
  Administered 2020-12-18: 10 mg via INTRAVENOUS
  Filled 2020-12-18: qty 1

## 2020-12-18 MED ORDER — HYDROXYZINE HCL 25 MG PO TABS
25.0000 mg | ORAL_TABLET | Freq: Three times a day (TID) | ORAL | Status: DC | PRN
  Administered 2020-12-18: 25 mg via ORAL
  Filled 2020-12-18: qty 1

## 2020-12-18 MED ORDER — PRAVASTATIN SODIUM 10 MG PO TABS
10.0000 mg | ORAL_TABLET | Freq: Every day | ORAL | Status: DC
  Administered 2020-12-19: 10 mg via ORAL
  Filled 2020-12-18: qty 1

## 2020-12-18 MED ORDER — NITROGLYCERIN 2 % TD OINT
1.0000 [in_us] | TOPICAL_OINTMENT | Freq: Once | TRANSDERMAL | Status: AC
  Administered 2020-12-18: 1 [in_us] via TOPICAL
  Filled 2020-12-18: qty 1

## 2020-12-18 MED ORDER — SACUBITRIL-VALSARTAN 97-103 MG PO TABS
1.0000 | ORAL_TABLET | Freq: Two times a day (BID) | ORAL | Status: DC
  Administered 2020-12-19 – 2020-12-20 (×2): 1 via ORAL
  Filled 2020-12-18 (×4): qty 1

## 2020-12-18 MED ORDER — CARVEDILOL 12.5 MG PO TABS
25.0000 mg | ORAL_TABLET | Freq: Two times a day (BID) | ORAL | Status: DC
  Administered 2020-12-19: 25 mg via ORAL
  Filled 2020-12-18: qty 2

## 2020-12-18 MED ORDER — ACETAMINOPHEN 500 MG PO TABS
1000.0000 mg | ORAL_TABLET | Freq: Once | ORAL | Status: AC
  Administered 2020-12-18: 1000 mg via ORAL
  Filled 2020-12-18: qty 2

## 2020-12-18 MED ORDER — LABETALOL HCL 5 MG/ML IV SOLN
5.0000 mg | Freq: Once | INTRAVENOUS | Status: AC
  Administered 2020-12-18: 5 mg via INTRAVENOUS
  Filled 2020-12-18: qty 4

## 2020-12-18 MED ORDER — TORSEMIDE 20 MG PO TABS
20.0000 mg | ORAL_TABLET | Freq: Every day | ORAL | Status: DC
  Administered 2020-12-20: 20 mg via ORAL
  Filled 2020-12-18: qty 1

## 2020-12-18 MED ORDER — NICARDIPINE HCL IN NACL 20-0.86 MG/200ML-% IV SOLN
3.0000 mg/h | INTRAVENOUS | Status: DC
  Filled 2020-12-18: qty 200

## 2020-12-18 MED ORDER — POTASSIUM CHLORIDE 20 MEQ PO PACK
40.0000 meq | PACK | Freq: Every day | ORAL | Status: DC
  Administered 2020-12-18 – 2020-12-20 (×2): 40 meq via ORAL
  Filled 2020-12-18 (×2): qty 2

## 2020-12-18 MED ORDER — ONDANSETRON HCL 4 MG/2ML IJ SOLN
4.0000 mg | Freq: Four times a day (QID) | INTRAMUSCULAR | Status: DC | PRN
  Administered 2020-12-19: 4 mg via INTRAVENOUS
  Filled 2020-12-18 (×2): qty 2

## 2020-12-18 MED ORDER — HYDRALAZINE HCL 20 MG/ML IJ SOLN
10.0000 mg | INTRAMUSCULAR | Status: DC | PRN
  Administered 2020-12-19: 10 mg via INTRAVENOUS
  Filled 2020-12-18: qty 1

## 2020-12-18 MED ORDER — ENOXAPARIN SODIUM 40 MG/0.4ML IJ SOSY
40.0000 mg | PREFILLED_SYRINGE | INTRAMUSCULAR | Status: DC
  Administered 2020-12-18 – 2020-12-19 (×2): 40 mg via SUBCUTANEOUS
  Filled 2020-12-18 (×2): qty 0.4

## 2020-12-18 MED ORDER — GABAPENTIN 300 MG PO CAPS
600.0000 mg | ORAL_CAPSULE | Freq: Every day | ORAL | Status: DC
  Administered 2020-12-18 – 2020-12-19 (×2): 600 mg via ORAL
  Filled 2020-12-18 (×2): qty 2

## 2020-12-18 NOTE — ED Triage Notes (Signed)
Chest pain that started at 0500 this morning Preceded by nausea sob and sweating. Currently rated out of 5/10. Hx of defibrillator placement. Pt tearful in triage.

## 2020-12-18 NOTE — ED Provider Notes (Signed)
[facility] EMERGENCY DEPARTMENT Provider Note   CSN: [id number] Arrival date & time: 12/18/20  1332     History Chief Complaint  Patient presents with   Chest Pain    Crystal Bryant is a 50 y.o. female.  HPI  Patient with history of CHF, diabetes, hypertension on Coreg, LBBB with biventricular ICD, presents with chest pain.  The chest pain started acutely at 5:36 AM in the morning.  Lasted for a few seconds and then resolved.  It was associated with nausea, diaphoresis, shortness of breath.  No vomiting.  Passed on its own, happened again later when she was still at rest.  She felt dizzy as if the room was spinning, did not pass out or have a syncopal event.  No prior history of similar events.  Last saw cardiologist a year ago.  Took her blood pressure medicine this morning.  Past Medical History:  Diagnosis Date   Anxiety    CHF (congestive heart failure) ([place])    Diabetes mellitus without complication ([place])    Hypertension    Lung cancer, upper lobe ([place]) 09/2015   Non-small cell in right upper lobe treated with RULectomy by VATS at [facility]    Patient Active Problem List   Diagnosis Date Noted   Cystitis 11/10/2020   GAD (generalized anxiety disorder) 07/22/2019   Pharyngoesophageal dysphagia 07/22/2019   Pacemaker lead malfunction 03/26/2019   Biventricular ICD (implantable cardioverter-defibrillator) in place 03/12/2019   LBBB (left bundle branch block) 08/12/2018   Type 2 diabetes mellitus with hyperglycemia, with long-term current use of insulin ([place]) 07/17/2018   COVID-19 virus infection 27/03/5007   Chronic systolic heart failure ([place]) 07/09/2018   Hypertensive disorder 05/13/2017   Diabetes mellitus ([place]) 05/13/2017   Status post lobectomy of lung 03/02/2017   Essential hypertension, benign 01/05/2017   Type 2 diabetes mellitus with hyperglycemia, without long-term current use of insulin ([place]) 01/05/2017   Postoperative visit 10/25/2015   Malignant neoplasm  of upper lobe of right lung ([place]) 09/21/2015    Past Surgical History:  Procedure Laterality Date   ABDOMINAL HYSTERECTOMY     BIV ICD INSERTION CRT-D N/A 12/04/2018   Procedure: BIV ICD INSERTION CRT-D;  Surgeon: Evans Lance, MD;  Location: [facility];  Service: Cardiovascular;  Laterality: N/A;   BREAST BIOPSY Right 02/10/2018   Biopsy of upper outer quadrant of right breast due to calcifications seen on mammography revealed fat necrosis with calcifications.  Benign so continue annual screening mammograms.   BREAST SURGERY     CESAREAN SECTION     LEAD REVISION/REPAIR N/A 03/26/2019   Procedure: LEAD REVISION/REPAIR;  Surgeon: Evans Lance, MD;  Location: [place] [facility];  Service: Cardiovascular;  Laterality: N/A;   LOBECTOMY Right 10/06/2015   [place] lung cancer treated with VATS right upper lobectomy by Dr. [name] at [facility].     OB History     Gravida  2   Para  2   Term  2   Preterm      AB      Living         SAB      IAB      Ectopic      Multiple      Live Births              Family History  Problem Relation Age of Onset   Diabetes Mother    Hypertension Mother    Stroke Father  Breast cancer Paternal Aunt    Breast cancer Paternal Aunt    Breast cancer Paternal Aunt    Breast cancer Paternal Aunt     Social History   Tobacco Use   Smoking status: Never   Smokeless tobacco: Never  Vaping Use   Vaping Use: Never used  Substance Use Topics   Alcohol use: No   Drug use: No    Home Medications Prior to Admission medications   Medication Sig Start Date End Date Taking? Authorizing Provider  amLODipine (NORVASC) 5 MG tablet TAKE 1 TABLET (5 MG TOTAL) BY MOUTH DAILY. 12/31/19 12/30/20  Shelda Pal, DO  Blood Glucose Monitoring Suppl (FREESTYLE LITE) DEVI Use As Directed 08/28/18   Maryruth Hancock, MD  carvedilol (COREG) 25 MG tablet TAKE 2 TABLETS BY MOUTH TWICE A DAY WITH A MEAL 04/07/20 04/07/21  Evans Lance, MD   Continuous Blood Gluc Receiver (DEXCOM G5 RECEIVER KIT) DEVI Use daily to check blood sugar.  DXE11.9 12/21/19   Shelda Pal, DO  Continuous Blood Gluc Receiver (DEXCOM G6 RECEIVER) DEVI USE DAILY TO CHECK BLOOD SUGAR. 12/21/19 12/20/20  Shelda Pal, DO  Continuous Blood Gluc Sensor (DEXCOM G6 SENSOR) MISC USE DAILY TO CHECK BLOOD SUGAR 02/04/20 02/03/21  Wendling, Crosby Oyster, DO  Continuous Blood Gluc Transmit (DEXCOM G6 TRANSMITTER) MISC USE TO CHECK BLOOD SUGAR. 12/21/19 12/20/20  Shelda Pal, DO  gabapentin (NEURONTIN) 300 MG capsule Take 2 capsules (600 mg total) by mouth at bedtime. 01/08/20   Shelda Pal, DO  gabapentin (NEURONTIN) 300 MG capsule Take 1 capsule (300 mg total) by mouth at bedtime. 06/15/20   Shelda Pal, DO  glucose blood (FREESTYLE LITE) test strip Use as instructed 08/28/18   Corum, Rex Kras, MD  hydrOXYzine (ATARAX/VISTARIL) 10 MG tablet TAKE 1 TO 2 TABLETS BY MOUTH EVERY 8 HOURS AS NEEDED FOR ANXIETY. 12/22/19 12/21/20  Shelda Pal, DO  insulin detemir (LEVEMIR) 100 UNIT/ML FlexPen Inject 20 Units into the skin at bedtime. 02/16/20   Renato Shin, MD  Insulin Glargine [facility]) 100 UNIT/ML INJECT 10 UNITS AT BEDTIME 01/04/20 01/03/21  Shelda Pal, DO  insulin lispro (HUMALOG) 100 UNIT/ML KwikPen INJECT 10 UNITS INTO THE SKIN 3 (THREE) TIMES DAILY WITH MEALS. 02/16/20 02/15/21  Renato Shin, MD  Insulin Pen Needle 31G X 6 MM MISC USE AS DIRECTED 4 TIMES DAILY 02/16/20 02/15/21  Maryruth Hancock, MD  Lancets (FREESTYLE) lancets Use as instructed 08/28/18   Corum, Rex Kras, MD  metFORMIN (GLUCOPHAGE-XR) 500 MG 24 hr tablet TAKE 1 TABLET (500 MG TOTAL) BY MOUTH DAILY WITH BREAKFAST. 12/31/19 12/30/20  Shelda Pal, DO  potassium chloride 20 MEQ/15ML (10%) SOLN Take 7.5 mLs (10 mEq total) by mouth daily. 07/22/19   Shelda Pal, DO  pravastatin (PRAVACHOL) 10 MG tablet TAKE 1 TABLET  (10 MG TOTAL) BY MOUTH DAILY. 12/31/19 12/30/20  Shelda Pal, DO  sacubitril-valsartan (ENTRESTO) 97-103 MG Take 1 tablet by mouth 2 (two) times daily. 08/12/18   Nahser, Wonda Cheng, MD  torsemide (DEMADEX) 20 MG tablet TAKE 1 TABLET (20 MG TOTAL) BY MOUTH DAILY. 09/04/19   Nahser, Wonda Cheng, MD  venlafaxine XR (EFFEXOR-XR) 37.5 MG 24 hr capsule TAKE 1 CAPSULE (37.5 MG TOTAL) BY MOUTH DAILY WITH BREAKFAST. 02/24/20 02/23/21  Shelda Pal, DO  glyBURIDE (DIABETA) 5 MG tablet Take 1 tablet (5 mg total) by mouth daily with breakfast. 12/18/19 12/31/19  Shelda Pal, DO  Allergies    Patient has no known allergies.  Review of Systems   Review of Systems  Constitutional:  Negative for chills and fever.  HENT:  Negative for ear pain and sore throat.   Eyes:  Negative for pain and visual disturbance.  Respiratory:  Positive for shortness of breath. Negative for cough.   Cardiovascular:  Positive for chest pain. Negative for palpitations.  Gastrointestinal:  Positive for nausea. Negative for abdominal pain, diarrhea and vomiting.  Genitourinary:  Negative for dysuria and hematuria.  Musculoskeletal:  Negative for arthralgias and back pain.  Skin:  Negative for color change, rash and wound.  Neurological:  Positive for dizziness. Negative for seizures and syncope.  All other systems reviewed and are negative.  Physical Exam Updated Vital Signs BP (!) 252/133 (BP Location: Left Arm)   Pulse 87   Temp 98.7 F (37.1 C) (Oral)   Resp 14   Ht _0  (1.651 m)   Wt 65.8 kg   SpO2 100%   BMI 24.13 kg/m   Physical Exam Vitals and nursing note reviewed.  Constitutional:      General: She is not in acute distress.    Appearance: She is well-developed.  HENT:     Head: Normocephalic and atraumatic.  Eyes:     Conjunctiva/sclera: Conjunctivae normal.  Cardiovascular:     Rate and Rhythm: Normal rate and regular rhythm.     Heart sounds: No murmur heard. Pulmonary:      Effort: Pulmonary effort is normal. No respiratory distress.     Breath sounds: Normal breath sounds.     Comments: Lungs are clear to auscultation Abdominal:     Palpations: Abdomen is soft.     Tenderness: There is no abdominal tenderness.  Musculoskeletal:        General: No swelling.     Cervical back: Neck supple.     Right lower leg: No edema.     Left lower leg: No edema.     Comments: No lower leg edema  Skin:    General: Skin is warm and dry.     Capillary Refill: Capillary refill takes less than 2 seconds.  Neurological:     Mental Status: She is alert.  Psychiatric:        Mood and Affect: Mood normal.   ED Results / Procedures / Treatments   Labs (all labs ordered are listed, but only abnormal results are displayed) Labs Reviewed  BASIC METABOLIC PANEL  CBC  BRAIN NATRIURETIC PEPTIDE  TROPONIN I (HIGH SENSITIVITY)    EKG None  Radiology No results found.  Procedures Procedures   Medications Ordered in ED Medications  nitroGLYCERIN (NITROGLYN) 2 % ointment 1 inch (has no administration in time range)    ED Course  I have reviewed the triage vital signs and the nursing notes.  Pertinent labs & imaging results that were available during my care of the patient were reviewed by me and considered in my medical decision making (see chart for details).  Clinical Course as of 12/18/20 Mcarthur Rossetti Dec 18, 2020  2992 Basic metabolic panel(!) No gross electrolyte derangement, no AKI [HS]  1629 Doubt CHF exacerbation given normal BNP and no signs of cardiomegaly or vascular congestion concerning for fluid overload on chest x-ray.  Clinically, there is no pitting edema either so not consistent with CHF exacerbation. [HS]  4268 CBC No leukocytosis, no anemia.  Doubt infectious etiology of her chest pain. [HS]  3419 Patient reports she  is not having headache, we will proceed with CT head to make sure she is not having SAH.  Blood pressure is still 200/110 in the  room.  Still waiting on that third blood pressure medicine to be administered.  Suspect patient may require admission for hypertensive urgency.  The bedside her renal function looks good, delta troponin is negative so I do not think this is ACS. [HS]    Clinical Course User Index [HS] Sherrill Raring, PA-C   MDM Rules/Calculators/A&P                           Please see the course return patient rotation of labs and imaging as they pertain to the MDM.  Spoke with hospitalist Dr. Nehemiah Settle, he agrees with admission for hypertensive urgency.  Final Clinical Impression(s) / ED Diagnoses Final diagnoses:  None    Rx / DC Orders ED Discharge Orders     None        Sherrill Raring, PA-C 12/18/20 1758    Lorelle Gibbs, DO 12/19/20 1001

## 2020-12-18 NOTE — ED Notes (Signed)
Pt placed on cardiac monitor with BP to set cycle every 30 minutes. Continuous pulse oximeter applied.  

## 2020-12-18 NOTE — H&P (Signed)
History and Physical  Crystal Bryant MHD:[id number] DOB: September 12, 1970 DOA: 12/18/2020  Referring physician: Sherrill Raring, PA-C, EDP PCP: Shelda Pal, DO  Outpatient Specialists:   Patient Coming From: home  Chief Complaint: Dizziness, palpitations, chest pain  HPI: Crystal Bryant is a 50 y.o. female with a history of right upper lobe lung cancer status postresection, diabetes, hypertension, CHF.  Patient seen for chest pain, palpitations, and dizziness that has been intermittent since Wednesday but became persistent and severe this morning.  Initially, the symptoms lasted for a few minutes and then went away.  When it became persistent, the patient came to emergency department for evaluation.  She has been compliant with her medications and has had no recent changes to them.  Emergency Department Course: Initial blood pressure 260/133.  Patient given labetalol 5 mg IV x2 doses and Nitropaste which brought the patient into the 190/1 10-2 10/129 range.  Review of Systems:   Pt denies any fevers, chills, nausea, vomiting, diarrhea, constipation, abdominal pain, shortness of breath, dyspnea on exertion, orthopnea, cough, wheezing, palpitations, headache, vision changes, lightheadedness, dizziness, melena, rectal bleeding.  Review of systems are otherwise negative  Past Medical History:  Diagnosis Date   Anxiety    CHF (congestive heart failure) ([place])    Diabetes mellitus without complication ([place])    Hypertension    Lung cancer, upper lobe ([place]) 09/2015   Non-small cell in right upper lobe treated with RULectomy by VATS at [facility]   Past Surgical History:  Procedure Laterality Date   ABDOMINAL HYSTERECTOMY     BIV ICD INSERTION CRT-D N/A 12/04/2018   Procedure: BIV ICD INSERTION CRT-D;  Surgeon: Evans Lance, MD;  Location: [place] [facility];  Service: Cardiovascular;  Laterality: N/A;   BREAST BIOPSY Right 02/10/2018   Biopsy of upper outer quadrant of right breast due  to calcifications seen on mammography revealed fat necrosis with calcifications.  Benign so continue annual screening mammograms.   BREAST SURGERY     CESAREAN SECTION     LEAD REVISION/REPAIR N/A 03/26/2019   Procedure: LEAD REVISION/REPAIR;  Surgeon: Evans Lance, MD;  Location: [place] [facility];  Service: Cardiovascular;  Laterality: N/A;   LOBECTOMY Right 10/06/2015   [place] lung cancer treated with VATS right upper lobectomy by Dr. [name] at [facility].   Social History:  reports that she has never smoked. She has never used smokeless tobacco. She reports that she does not drink alcohol and does not use drugs. Patient lives at home  No Known Allergies  Family History  Problem Relation Age of Onset   Diabetes Mother    Hypertension Mother    Stroke Father    Breast cancer Paternal Aunt    Breast cancer Paternal Aunt    Breast cancer Paternal Aunt    Breast cancer Paternal Aunt       Prior to Admission medications   Medication Sig Start Date End Date Taking? Authorizing Provider  carvedilol (COREG) 25 MG tablet TAKE 2 TABLETS BY MOUTH TWICE A DAY WITH A MEAL 04/07/20 04/07/21 Yes Evans Lance, MD  gabapentin (NEURONTIN) 300 MG capsule Take 2 capsules (600 mg total) by mouth at bedtime. 01/08/20  Yes Shelda Pal, DO  hydrOXYzine (ATARAX/VISTARIL) 10 MG tablet TAKE 1 TO 2 TABLETS BY MOUTH EVERY 8 HOURS AS NEEDED FOR ANXIETY. 12/22/19 12/21/20 Yes Wendling, Crosby Oyster, DO  insulin lispro (HUMALOG) 100 UNIT/ML KwikPen INJECT 10 UNITS INTO THE SKIN 3 (THREE) TIMES DAILY WITH MEALS. Patient  taking differently: 0-30 Units 3 (three) times daily. Sliding scale 02/16/20 02/15/21 Yes Renato Shin, MD  potassium chloride 20 MEQ/15ML (10%) SOLN Take 7.5 mLs (10 mEq total) by mouth daily. 07/22/19  Yes Wendling, Crosby Oyster, DO  sacubitril-valsartan (ENTRESTO) 97-103 MG Take 1 tablet by mouth 2 (two) times daily. 08/12/18  Yes Nahser, Wonda Cheng, MD  torsemide (DEMADEX) 20 MG tablet  TAKE 1 TABLET (20 MG TOTAL) BY MOUTH DAILY. 09/04/19  Yes Nahser, Wonda Cheng, MD  amLODipine (NORVASC) 5 MG tablet TAKE 1 TABLET (5 MG TOTAL) BY MOUTH DAILY. Patient not taking: Reported on 12/18/2020 12/31/19 12/30/20  Shelda Pal, DO  Blood Glucose Monitoring Suppl (FREESTYLE LITE) DEVI Use As Directed 08/28/18   Maryruth Hancock, MD  Continuous Blood Gluc Receiver (DEXCOM G5 RECEIVER KIT) DEVI Use daily to check blood sugar.  DXE11.9 12/21/19   Shelda Pal, DO  Continuous Blood Gluc Receiver (DEXCOM G6 RECEIVER) DEVI USE DAILY TO CHECK BLOOD SUGAR. 12/21/19 12/20/20  Shelda Pal, DO  Continuous Blood Gluc Sensor (DEXCOM G6 SENSOR) MISC USE DAILY TO CHECK BLOOD SUGAR 02/04/20 02/03/21  Wendling, Crosby Oyster, DO  Continuous Blood Gluc Transmit (DEXCOM G6 TRANSMITTER) MISC USE TO CHECK BLOOD SUGAR. 12/21/19 12/20/20  Shelda Pal, DO  gabapentin (NEURONTIN) 300 MG capsule Take 1 capsule (300 mg total) by mouth at bedtime. Patient not taking: Reported on 12/18/2020 06/15/20   Shelda Pal, DO  glucose blood (FREESTYLE LITE) test strip Use as instructed 08/28/18   Maryruth Hancock, MD  insulin detemir (LEVEMIR) 100 UNIT/ML FlexPen Inject 20 Units into the skin at bedtime. Patient not taking: Reported on 12/18/2020 02/16/20   Renato Shin, MD  Insulin Glargine [facility]) 100 UNIT/ML INJECT 10 UNITS AT BEDTIME Patient not taking: Reported on 12/18/2020 01/04/20 01/03/21  Shelda Pal, DO  Insulin Pen Needle 31G X 6 MM MISC USE AS DIRECTED 4 TIMES DAILY 02/16/20 02/15/21  Maryruth Hancock, MD  Lancets (FREESTYLE) lancets Use as instructed 08/28/18   Maryruth Hancock, MD  metFORMIN (GLUCOPHAGE-XR) 500 MG 24 hr tablet TAKE 1 TABLET (500 MG TOTAL) BY MOUTH DAILY WITH BREAKFAST. Patient not taking: Reported on 12/18/2020 12/31/19 12/30/20  Shelda Pal, DO  pravastatin (PRAVACHOL) 10 MG tablet TAKE 1 TABLET (10 MG TOTAL) BY MOUTH DAILY. Patient not  taking: Reported on 12/18/2020 12/31/19 12/30/20  Shelda Pal, DO  venlafaxine XR (EFFEXOR-XR) 37.5 MG 24 hr capsule TAKE 1 CAPSULE (37.5 MG TOTAL) BY MOUTH DAILY WITH BREAKFAST. Patient not taking: Reported on 12/18/2020 02/24/20 02/23/21  Shelda Pal, DO  glyBURIDE (DIABETA) 5 MG tablet Take 1 tablet (5 mg total) by mouth daily with breakfast. 12/18/19 12/31/19  Shelda Pal, DO    Physical Exam: BP (!) 165/104   Pulse 94   Temp 98.7 F (37.1 C) (Oral)   Resp (!) 24   Ht '5\' 5"'  (1.651 m)   Wt 65.8 kg   SpO2 100%   BMI 24.13 kg/m   General: Middle-age female. Awake and alert and oriented x3. No acute cardiopulmonary distress.  HEENT: Normocephalic atraumatic.  Right and left ears normal in appearance.  Pupils equal, round, reactive to light. Extraocular muscles are intact. Sclerae anicteric and noninjected.  Moist mucosal membranes. No mucosal lesions.  Neck: Neck supple without lymphadenopathy. No carotid bruits. No masses palpated.  Cardiovascular: Regular rate with normal S1-S2 sounds. No murmurs, rubs, gallops auscultated. No JVD.  Respiratory: Good respiratory effort with no  wheezes, rales, rhonchi. Lungs clear to auscultation bilaterally.  No accessory muscle use. Abdomen: Soft, nontender, nondistended. Active bowel sounds. No masses or hepatosplenomegaly  Skin: No rashes, lesions, or ulcerations.  Dry, warm to touch. 2+ dorsalis pedis and radial pulses. Musculoskeletal: No calf or leg pain. All major joints not erythematous nontender.  No upper or lower joint deformation.  Good ROM.  No contractures  Psychiatric: Intact judgment and insight. Pleasant and cooperative. Neurologic: No focal neurological deficits. Strength is 5/5 and symmetric in upper and lower extremities.  Cranial nerves II through XII are grossly intact.           Labs on Admission: I have personally reviewed following labs and imaging studies  CBC: Recent Labs  Lab  12/18/20 1346  WBC 6.6  HGB 13.1  HCT 39.4  MCV 84.9  PLT 325   Basic Metabolic Panel: Recent Labs  Lab 12/18/20 1346  NA 137  K 3.5  CL 102  CO2 27  GLUCOSE 146*  BUN 13  CREATININE 0.89  CALCIUM 9.5   GFR: Estimated Creatinine Clearance: 69.6 mL/min (by C-G formula based on SCr of 0.89 mg/dL). Liver Function Tests: No results for input(s): AST, ALT, ALKPHOS, BILITOT, PROT, ALBUMIN in the last 168 hours. No results for input(s): LIPASE, AMYLASE in the last 168 hours. No results for input(s): AMMONIA in the last 168 hours. Coagulation Profile: No results for input(s): INR, PROTIME in the last 168 hours. Cardiac Enzymes: No results for input(s): CKTOTAL, CKMB, CKMBINDEX, TROPONINI in the last 168 hours. BNP (last 3 results) No results for input(s): PROBNP in the last 8760 hours. HbA1C: No results for input(s): HGBA1C in the last 72 hours. CBG: No results for input(s): GLUCAP in the last 168 hours. Lipid Profile: No results for input(s): CHOL, HDL, LDLCALC, TRIG, CHOLHDL, LDLDIRECT in the last 72 hours. Thyroid Function Tests: No results for input(s): TSH, T4TOTAL, FREET4, T3FREE, THYROIDAB in the last 72 hours. Anemia Panel: No results for input(s): VITAMINB12, FOLATE, FERRITIN, TIBC, IRON, RETICCTPCT in the last 72 hours. Urine analysis:    Component Value Date/Time   COLORURINE YELLOW 07/08/2018 0015   APPEARANCEUR HAZY (A) 07/08/2018 0015   LABSPEC 1.035 (H) 07/08/2018 0015   PHURINE 5.0 07/08/2018 0015   GLUCOSEU >=500 (A) 07/08/2018 0015   HGBUR NEGATIVE 07/08/2018 0015   BILIRUBINUR neg 11/10/2020 1113   KETONESUR 20 (A) 07/08/2018 0015   PROTEINUR Positive (A) 11/10/2020 1113   PROTEINUR 30 (A) 07/08/2018 0015   UROBILINOGEN 1.0 11/10/2020 1113   NITRITE positive 11/10/2020 1113   NITRITE NEGATIVE 07/08/2018 0015   LEUKOCYTESUR Large (3+) (A) 11/10/2020 1113   LEUKOCYTESUR MODERATE (A) 07/08/2018 0015   Sepsis  Labs: '@LABRCNTIP' (procalcitonin:4,lacticidven:4) ) Recent Results (from the past 240 hour(s))  Resp Panel by RT-PCR (Flu A&B, Covid) Nasopharyngeal Swab     Status: None   Collection Time: 12/18/20  5:30 PM   Specimen: Nasopharyngeal Swab; Nasopharyngeal(NP) swabs in vial transport medium  Result Value Ref Range Status   SARS Coronavirus 2 by RT PCR NEGATIVE NEGATIVE Final    Comment: (NOTE) SARS-CoV-2 target nucleic acids are NOT DETECTED.  The SARS-CoV-2 RNA is generally detectable in upper respiratory specimens during the acute phase of infection. The lowest concentration of SARS-CoV-2 viral copies this assay can detect is 138 copies/mL. A negative result does not preclude SARS-Cov-2 infection and should not be used as the sole basis for treatment or other patient management decisions. A negative result may occur with  improper  specimen collection/handling, submission of specimen other than nasopharyngeal swab, presence of viral mutation(s) within the areas targeted by this assay, and inadequate number of viral copies(<138 copies/mL). A negative result must be combined with clinical observations, patient history, and epidemiological information. The expected result is Negative.  Fact Sheet for Patients:  EntrepreneurPulse.com.au  Fact Sheet for Healthcare Providers:  IncredibleEmployment.be  This test is no t yet approved or cleared by the [place] FDA and  has been authorized for detection and/or diagnosis of SARS-CoV-2 by FDA under an Emergency Use Authorization (EUA). This EUA will remain  in effect (meaning this test can be used) for the duration of the COVID-19 declaration under Section 564(b)(1) of the Act, 21 U.S.C.section 360bbb-3(b)(1), unless the authorization is terminated  or revoked sooner.       Influenza A by PCR NEGATIVE NEGATIVE Final   Influenza B by PCR NEGATIVE NEGATIVE Final    Comment: (NOTE) The Xpert Xpress  SARS-CoV-2/FLU/RSV plus assay is intended as an aid in the diagnosis of influenza from Nasopharyngeal swab specimens and should not be used as a sole basis for treatment. Nasal washings and aspirates are unacceptable for Xpert Xpress SARS-CoV-2/FLU/RSV testing.  Fact Sheet for Patients: EntrepreneurPulse.com.au  Fact Sheet for Healthcare Providers: IncredibleEmployment.be  This test is not yet approved or cleared by the [place] FDA and has been authorized for detection and/or diagnosis of SARS-CoV-2 by FDA under an Emergency Use Authorization (EUA). This EUA will remain in effect (meaning this test can be used) for the duration of the COVID-19 declaration under Section 564(b)(1) of the Act, 21 U.S.C. section 360bbb-3(b)(1), unless the authorization is terminated or revoked.  Performed at [facility], [address] [place], [place] [address]      Radiological Exams on Admission: CT Head Wo Contrast  Result Date: 12/18/2020 CLINICAL DATA:  Headache, intracranial hemorrhage suspected EXAM: CT HEAD WITHOUT CONTRAST TECHNIQUE: Contiguous axial images were obtained from the base of the skull through the vertex without intravenous contrast. COMPARISON:  None. FINDINGS: Brain: No evidence of acute infarction, hemorrhage, hydrocephalus, extra-axial collection or mass lesion/mass effect. Vascular: No hyperdense vessel or unexpected calcification. Skull: Normal. Negative for fracture or focal lesion. Sinuses/Orbits: No acute finding. Other: None. IMPRESSION: No acute intracranial findings. Electronically Signed   By: Davina Poke D.O.   On: 12/18/2020 18:00   DG Chest Port 1 View  Result Date: 12/18/2020 CLINICAL DATA:  Chest pain. EXAM: PORTABLE CHEST 1 VIEW COMPARISON:  December 18, 2019 FINDINGS: Scarring in the right lateral lung base. No pneumothorax. The lungs are otherwise clear. Stable AICD device. The cardiomediastinal silhouette is  normal. IMPRESSION: No active disease. Electronically Signed   By: Dorise Bullion III M.D.   On: 12/18/2020 14:17    EKG: Independently reviewed.  Sinus rhythm.  Left atrial enlargement.  Probable old anterior septal infarct.  Mild ST depression in inferior leads.  No acute ST changes.  Assessment/Plan: Principal Problem:   Hypertensive urgency Active Problems:   Essential hypertension, benign   LBBB (left bundle branch block)   Diabetes mellitus ([place])   Biventricular ICD (implantable cardioverter-defibrillator) in place    This patient was discussed with the ED physician, including pertinent vitals, physical exam findings, labs, and imaging.  We also discussed care given by the ED provider.  Hypertensive urgency with a history of hypertension Hydralazine given with good response. If increases again, will consider Cardene drip Restart antihypertensives in the morning Will be cautious about sudden reduction of blood pressure  and will aim for blood pressure less than 180/110 overnight. Hydralazine IV as needed Echocardiogram Repeat labs in the morning Coronary artery disease with left bundle branch block and HFrEF Continue statin Continue beta-blocker Check echo in the morning Biventricular ICD in place Diabetes diet-controlled Diabetic diet History of lung cancer  DVT prophylaxis: Lovenox Consultants: None Code Status: Full code Family Communication: None Disposition Plan: Patient should be able to return home following stabilization of blood pressure   Truett Mainland, DO

## 2020-12-19 ENCOUNTER — Inpatient Hospital Stay (HOSPITAL_BASED_OUTPATIENT_CLINIC_OR_DEPARTMENT_OTHER): Payer: 59

## 2020-12-19 DIAGNOSIS — Z95810 Presence of automatic (implantable) cardiac defibrillator: Secondary | ICD-10-CM | POA: Diagnosis not present

## 2020-12-19 DIAGNOSIS — I160 Hypertensive urgency: Secondary | ICD-10-CM

## 2020-12-19 DIAGNOSIS — I10 Essential (primary) hypertension: Secondary | ICD-10-CM | POA: Diagnosis not present

## 2020-12-19 DIAGNOSIS — Z85118 Personal history of other malignant neoplasm of bronchus and lung: Secondary | ICD-10-CM | POA: Diagnosis not present

## 2020-12-19 DIAGNOSIS — I169 Hypertensive crisis, unspecified: Secondary | ICD-10-CM | POA: Diagnosis present

## 2020-12-19 DIAGNOSIS — Z20822 Contact with and (suspected) exposure to covid-19: Secondary | ICD-10-CM | POA: Diagnosis not present

## 2020-12-19 DIAGNOSIS — Z794 Long term (current) use of insulin: Secondary | ICD-10-CM | POA: Diagnosis not present

## 2020-12-19 DIAGNOSIS — I110 Hypertensive heart disease with heart failure: Secondary | ICD-10-CM | POA: Diagnosis not present

## 2020-12-19 DIAGNOSIS — I509 Heart failure, unspecified: Secondary | ICD-10-CM | POA: Diagnosis not present

## 2020-12-19 DIAGNOSIS — E119 Type 2 diabetes mellitus without complications: Secondary | ICD-10-CM | POA: Diagnosis not present

## 2020-12-19 LAB — URINALYSIS, ROUTINE W REFLEX MICROSCOPIC
Bilirubin Urine: NEGATIVE
Glucose, UA: >=500 mg/dL — AB
Ketones, ur: 5 mg/dL — AB
Nitrite: NEGATIVE
Protein, ur: 30 mg/dL — AB
Specific Gravity, Urine: 1.013 (ref 1.005–1.030)
WBC, UA: >50 WBC/hpf — ABNORMAL HIGH (ref 0–5)
pH: 5 (ref 5.0–8.0)

## 2020-12-19 LAB — CBG MONITORING, ED
Glucose-Capillary: 198 mg/dL — ABNORMAL HIGH (ref 70–99)
Glucose-Capillary: 224 mg/dL — ABNORMAL HIGH (ref 70–99)
Glucose-Capillary: 299 mg/dL — ABNORMAL HIGH (ref 70–99)
Glucose-Capillary: 268 mg/dL — ABNORMAL HIGH (ref 70–99)

## 2020-12-19 LAB — BASIC METABOLIC PANEL
Anion gap: 8 (ref 5–15)
BUN: 14 mg/dL (ref 6–20)
CO2: 26 mmol/L (ref 22–32)
Calcium: 9.2 mg/dL (ref 8.9–10.3)
Chloride: 104 mmol/L (ref 98–111)
Creatinine, Ser: 0.89 mg/dL (ref 0.44–1.00)
GFR, Estimated: >60 mL/min (ref >60)
Glucose, Bld: 215 mg/dL — ABNORMAL HIGH (ref 70–99)
Potassium: 4.2 mmol/L (ref 3.5–5.1)
Sodium: 138 mmol/L (ref 135–145)

## 2020-12-19 LAB — GLUCOSE, CAPILLARY: Glucose-Capillary: 297 mg/dL — ABNORMAL HIGH (ref 70–99)

## 2020-12-19 LAB — ECHOCARDIOGRAM COMPLETE
AR max vel: 1.72 cm2
AV Area VTI: 1.64 cm2
AV Area mean vel: 1.63 cm2
AV Mean grad: 9 mmHg
AV Peak grad: 14.7 mmHg
Ao pk vel: 1.92 m/s
Area-P 1/2: 4.36 cm2
Calc EF: 54.5 %
Height: 65 in
S' Lateral: 2.4 cm
Single Plane A2C EF: 57.9 %
Single Plane A4C EF: 50 %
Weight: 2320 oz

## 2020-12-19 LAB — HEMOGLOBIN A1C
Hgb A1c MFr Bld: 10.5 % — ABNORMAL HIGH (ref 4.8–5.6)
Mean Plasma Glucose: 255 mg/dL

## 2020-12-19 LAB — HIV ANTIBODY (ROUTINE TESTING W REFLEX): HIV Screen 4th Generation wRfx: NONREACTIVE

## 2020-12-19 MED ORDER — MECLIZINE HCL 12.5 MG PO TABS
25.0000 mg | ORAL_TABLET | Freq: Once | ORAL | Status: AC
  Administered 2020-12-19: 25 mg via ORAL
  Filled 2020-12-19: qty 2

## 2020-12-19 MED ORDER — INSULIN ASPART 100 UNIT/ML IJ SOLN
0.0000 [IU] | Freq: Three times a day (TID) | INTRAMUSCULAR | Status: DC
  Administered 2020-12-19 (×2): 5 [IU] via SUBCUTANEOUS
  Administered 2020-12-19 – 2020-12-20 (×2): 3 [IU] via SUBCUTANEOUS
  Administered 2020-12-20: 5 [IU] via SUBCUTANEOUS
  Filled 2020-12-19 (×3): qty 1

## 2020-12-19 MED ORDER — ONDANSETRON HCL 4 MG/2ML IJ SOLN
4.0000 mg | Freq: Four times a day (QID) | INTRAMUSCULAR | Status: DC | PRN
  Administered 2020-12-20: 4 mg via INTRAVENOUS
  Filled 2020-12-19 (×2): qty 2

## 2020-12-19 MED ORDER — AMLODIPINE BESYLATE 5 MG PO TABS
10.0000 mg | ORAL_TABLET | Freq: Every day | ORAL | Status: DC
  Administered 2020-12-20: 10 mg via ORAL
  Filled 2020-12-19: qty 2

## 2020-12-19 MED ORDER — PROCHLORPERAZINE 25 MG RE SUPP
25.0000 mg | Freq: Once | RECTAL | Status: AC
Start: 2020-12-19 — End: 2020-12-19
  Administered 2020-12-19: 25 mg via RECTAL
  Filled 2020-12-19 (×2): qty 1

## 2020-12-19 MED ORDER — SODIUM CHLORIDE 0.9 % IV SOLN
1.0000 g | INTRAVENOUS | Status: DC
  Administered 2020-12-19: 1 g via INTRAVENOUS
  Filled 2020-12-19 (×2): qty 10

## 2020-12-19 MED ORDER — INSULIN ASPART 100 UNIT/ML IJ SOLN
0.0000 [IU] | Freq: Every day | INTRAMUSCULAR | Status: DC
  Administered 2020-12-19: 3 [IU] via SUBCUTANEOUS

## 2020-12-19 MED ORDER — ONDANSETRON HCL 4 MG/2ML IJ SOLN
4.0000 mg | Freq: Once | INTRAMUSCULAR | Status: AC
  Administered 2020-12-19: 4 mg via INTRAVENOUS

## 2020-12-19 MED ORDER — PROCHLORPERAZINE 25 MG RE SUPP
25.0000 mg | Freq: Two times a day (BID) | RECTAL | Status: DC | PRN
  Filled 2020-12-19: qty 1

## 2020-12-19 MED ORDER — ACETAMINOPHEN 500 MG PO TABS
1000.0000 mg | ORAL_TABLET | Freq: Once | ORAL | Status: AC
  Administered 2020-12-19: 1000 mg via ORAL
  Filled 2020-12-19: qty 2

## 2020-12-19 MED ORDER — HYDRALAZINE HCL 20 MG/ML IJ SOLN
10.0000 mg | Freq: Four times a day (QID) | INTRAMUSCULAR | Status: DC | PRN
  Administered 2020-12-19: 10 mg via INTRAVENOUS
  Filled 2020-12-19: qty 1

## 2020-12-19 MED ORDER — PROCHLORPERAZINE 25 MG RE SUPP
25.0000 mg | Freq: Two times a day (BID) | RECTAL | Status: DC
  Filled 2020-12-19 (×4): qty 1

## 2020-12-19 MED ORDER — INSULIN GLARGINE-YFGN 100 UNIT/ML ~~LOC~~ SOLN
15.0000 [IU] | Freq: Every day | SUBCUTANEOUS | Status: DC
  Filled 2020-12-19 (×2): qty 0.15

## 2020-12-19 MED ORDER — AMLODIPINE BESYLATE 5 MG PO TABS: 5.0000 mg | ORAL_TABLET | Freq: Once | ORAL | Status: DC

## 2020-12-19 MED ORDER — AMLODIPINE BESYLATE 5 MG PO TABS
10.0000 mg | ORAL_TABLET | Freq: Once | ORAL | Status: AC
  Administered 2020-12-19: 10 mg via ORAL
  Filled 2020-12-19: qty 2

## 2020-12-19 MED ORDER — CARVEDILOL 12.5 MG PO TABS
50.0000 mg | ORAL_TABLET | Freq: Two times a day (BID) | ORAL | Status: DC
  Administered 2020-12-19 – 2020-12-20 (×2): 50 mg via ORAL
  Filled 2020-12-19 (×2): qty 4

## 2020-12-19 NOTE — ED Notes (Signed)
Took pt her meal tray at 5.30 pm pt did not want it.

## 2020-12-19 NOTE — ED Notes (Signed)
Pt states she has not had her insulin, no orders in [facility].  CBG 198. Dr. Josephine Cables notified.

## 2020-12-19 NOTE — ED Notes (Signed)
Still waiting for Compazine... this RN is calling pharmacy now.

## 2020-12-19 NOTE — ED Notes (Signed)
Pt called secretary went into pt room pt is req Therapist, sports for Nausea med

## 2020-12-19 NOTE — ED Notes (Signed)
This RN notified for pharmacy to please send compazine to ED

## 2020-12-19 NOTE — ED Notes (Signed)
Pt had one episode of emesis after walking to nurses station with unsteady gait and c/o dizziness when standing. Dr. Denton Brick informed.

## 2020-12-19 NOTE — ED Notes (Signed)
Pt is requesting something for dizziness. Dr. Denton Brick informed.

## 2020-12-19 NOTE — ED Notes (Signed)
This RN has called pharmacy to check on status of Compazine supp. Stated it is ready for the pharmacy tech to deliver from ED.

## 2020-12-19 NOTE — Progress Notes (Addendum)
PROGRESS NOTE     Crystal Bryant, is a 50 y.o. female, DOB - July 27, 1970, YDX:[id number]  Admit date - 12/18/2020   Admitting Physician Truett Mainland, DO  Outpatient Primary MD for the patient is Shelda Pal, DO  LOS - 1  Chief Complaint  Patient presents with   Chest Pain        Brief Narrative:  50 y.o. female with a history of right upper lobe lung cancer status postresection, diabetes, hypertension, CHF admittedon 12/18/20  with uncontrolled hypertension, persistent dizziness, vertigo, palpitations, chest discomfort and nausea with emesis  Assessment & Plan:   Principal Problem:   Hypertensive urgency Active Problems:   Essential hypertension, benign   Biventricular ICD (implantable cardioverter-defibrillator) in place   LBBB (left bundle branch block)   Diabetes mellitus ([place])   1)Hypertensive Urgency--- CTA without acute findings -Echo showed improved EF up to 55 to 60% from 30-35 % patient previously, diastolic dysfunction and moderately enlarged left atrium -BP improved significantly with interventions -Unfortunately patient continues to have nausea, vomiting vertigo/dizziness with unsteady gait -Unable to safely ambulate, unable to discharge home at this time -Continue Coreg 50 mg twice daily, amlodipine added, -Continue Entresto -May use iv hydralazine as in as needed  2)HFpEF--patient with history of combined systolic and diastolic dysfunction CHF -Echo improved as noted above #1 -No overt CHF decompensation/exacerbation at this time -Continue torsemide and Entresto, as well as Coreg -Be judicious with IV fluid  3)Possible UTI--- IV Rocephin pending further culture data  4) intractable emesis with persistentdizziness,/vertigo --- Zofran and Compazine as ordered,  -Meclizine as ordered -Unable to tolerate oral intake at this time   5)DM2-A1c is 11.5 reflecting uncontrolled DM with hyperglycemia PTA -Give Lantus insulin 15 units  nightly Use Novolog/Humalog Sliding scale insulin with Accu-Cheks/Fingersticks as ordered   6) right upper lobe lung cancer status postresection--- ongoing outpatient follow-up/surveillance advised  Disposition/Need for in-Hospital Stay- patient unable to be discharged at this time due to -persistent emesis, persistent dizziness, headaches, unsteady gait due to hypertensive urgency -Anticipate discharge home in 1 to 2 days if symptoms improve, if tolerating oral intake  Status is: Inpatient  Remains inpatient appropriate because: Please see disposition above  Disposition: The patient is from: Home              Anticipated d/c is to: Home              Anticipated d/c date is: 2 days              Patient currently is not medically stable to d/c. Barriers: Not Clinically Stable-   Code Status :  -  Code Status: Full Code   Family Communication:    NA (patient is alert, awake and coherent)   Consults  :  na  DVT Prophylaxis  :   - SCDs  enoxaparin (LOVENOX) injection 40 mg Start: 12/18/20 2015    Lab Results  Component Value Date   PLT 337 12/18/2020    Inpatient Medications  Scheduled Meds:  carvedilol  25 mg Oral BID WC   enoxaparin (LOVENOX) injection  40 mg Subcutaneous Q24H   gabapentin  600 mg Oral QHS   insulin aspart  0-5 Units Subcutaneous QHS   insulin aspart  0-9 Units Subcutaneous TID WC   ivabradine  5 mg Oral BID WC   meclizine  25 mg Oral Once   potassium chloride  40 mEq Oral Daily   pravastatin  10 mg Oral q1800  prochlorperazine  25 mg Rectal Once   sacubitril-valsartan  1 tablet Oral BID   torsemide  20 mg Oral Daily   Continuous Infusions:  cefTRIAXone (ROCEPHIN)  IV Stopped (12/19/20 1523)   PRN Meds:.hydrALAZINE, hydrOXYzine   Anti-infectives (From admission, onward)    Start     Dose/Rate Route Frequency Ordered Stop   12/19/20 0900  cefTRIAXone (ROCEPHIN) 1 g in sodium chloride 0.9 % 100 mL IVPB        1 g 200 mL/hr over 30 Minutes  Intravenous Every 24 hours 12/19/20 0743           Subjective: [facility] today has no fevers,  -persistent emesis, persistent dizziness, headaches, unsteady gait due to hypertensive urgency - Unable to tolerate oral intake at this time   Objective: Vitals:   12/19/20 1530 12/19/20 1545 12/19/20 1600 12/19/20 1615  BP: (!) 167/102 (!) 150/98 (!) 200/127 139/89  Pulse: 99 97 (!) 110 (!) 102  Resp: (!) 24 (!) 22 (!) 23 18  Temp:      TempSrc:      SpO2: 100% 98% 97% 99%  Weight:      Height:        Intake/Output Summary (Last 24 hours) at 12/19/2020 1630 Last data filed at 12/19/2020 1523 Gross per 24 hour  Intake 100 ml  Output --  Net 100 ml   Filed Weights   12/18/20 1346  Weight: 65.8 kg     Physical Exam  Gen:- Awake Alert, uncomfortable with headache and vomiting HEENT:- [place].AT, No sclera icterus Neck-Supple Neck,No JVD,.  Lungs-  CTAB , fair symmetrical air movement CV- S1, S2 normal, regular  Abd-  +ve B.Sounds, Abd Soft, No tenderness,    Extremity/Skin:- No  edema, pedal pulses present  Psych-affect is appropriate, oriented x3 Neuro-no new focal deficits, no tremors, has dizziness/vertigo/unsteady gait  Data Reviewed: I have personally reviewed following labs and imaging studies  CBC: Recent Labs  Lab 12/18/20 1346  WBC 6.6  HGB 13.1  HCT 39.4  MCV 84.9  PLT 144   Basic Metabolic Panel: Recent Labs  Lab 12/18/20 1346 12/19/20 0535  NA 137 138  K 3.5 4.2  CL 102 104  CO2 27 26  GLUCOSE 146* 215*  BUN 13 14  CREATININE 0.89 0.89  CALCIUM 9.5 9.2   GFR: Estimated Creatinine Clearance: 69.6 mL/min (by C-G formula based on SCr of 0.89 mg/dL). Liver Function Tests: No results for input(s): AST, ALT, ALKPHOS, BILITOT, PROT, ALBUMIN in the last 168 hours. No results for input(s): LIPASE, AMYLASE in the last 168 hours. No results for input(s): AMMONIA in the last 168 hours. Coagulation Profile: No results for input(s): INR,  PROTIME in the last 168 hours. Cardiac Enzymes: No results for input(s): CKTOTAL, CKMB, CKMBINDEX, TROPONINI in the last 168 hours. BNP (last 3 results) No results for input(s): PROBNP in the last 8760 hours. HbA1C: No results for input(s): HGBA1C in the last 72 hours. CBG: Recent Labs  Lab 12/19/20 0507 12/19/20 0836 12/19/20 1132  GLUCAP 198* 224* 299*   Lipid Profile: No results for input(s): CHOL, HDL, LDLCALC, TRIG, CHOLHDL, LDLDIRECT in the last 72 hours. Thyroid Function Tests: No results for input(s): TSH, T4TOTAL, FREET4, T3FREE, THYROIDAB in the last 72 hours. Anemia Panel: No results for input(s): VITAMINB12, FOLATE, FERRITIN, TIBC, IRON, RETICCTPCT in the last 72 hours. Urine analysis:    Component Value Date/Time   COLORURINE YELLOW 12/19/2020 0043   APPEARANCEUR CLOUDY (A) 12/19/2020 0043  LABSPEC 1.013 12/19/2020 0043   PHURINE 5.0 12/19/2020 0043   GLUCOSEU >=500 (A) 12/19/2020 0043   HGBUR SMALL (A) 12/19/2020 0043   BILIRUBINUR NEGATIVE 12/19/2020 0043   BILIRUBINUR neg 11/10/2020 1113   KETONESUR 5 (A) 12/19/2020 0043   PROTEINUR 30 (A) 12/19/2020 0043   UROBILINOGEN 1.0 11/10/2020 1113   NITRITE NEGATIVE 12/19/2020 0043   LEUKOCYTESUR LARGE (A) 12/19/2020 0043   Sepsis Labs: @LABRCNTIP (procalcitonin:4,lacticidven:4)  ) Recent Results (from the past 240 hour(s))  Resp Panel by RT-PCR (Flu A&B, Covid) Nasopharyngeal Swab     Status: None   Collection Time: 12/18/20  5:30 PM   Specimen: Nasopharyngeal Swab; Nasopharyngeal(NP) swabs in vial transport medium  Result Value Ref Range Status   SARS Coronavirus 2 by RT PCR NEGATIVE NEGATIVE Final    Comment: (NOTE) SARS-CoV-2 target nucleic acids are NOT DETECTED.  The SARS-CoV-2 RNA is generally detectable in upper respiratory specimens during the acute phase of infection. The lowest concentration of SARS-CoV-2 viral copies this assay can detect is 138 copies/mL. A negative result does not preclude  SARS-Cov-2 infection and should not be used as the sole basis for treatment or other patient management decisions. A negative result may occur with  improper specimen collection/handling, submission of specimen other than nasopharyngeal swab, presence of viral mutation(s) within the areas targeted by this assay, and inadequate number of viral copies(<138 copies/mL). A negative result must be combined with clinical observations, patient history, and epidemiological information. The expected result is Negative.  Fact Sheet for Patients:  EntrepreneurPulse.com.au  Fact Sheet for Healthcare Providers:  IncredibleEmployment.be  This test is no t yet approved or cleared by the [place] FDA and  has been authorized for detection and/or diagnosis of SARS-CoV-2 by FDA under an Emergency Use Authorization (EUA). This EUA will remain  in effect (meaning this test can be used) for the duration of the COVID-19 declaration under Section 564(b)(1) of the Act, 21 U.S.C.section 360bbb-3(b)(1), unless the authorization is terminated  or revoked sooner.       Influenza A by PCR NEGATIVE NEGATIVE Final   Influenza B by PCR NEGATIVE NEGATIVE Final    Comment: (NOTE) The Xpert Xpress SARS-CoV-2/FLU/RSV plus assay is intended as an aid in the diagnosis of influenza from Nasopharyngeal swab specimens and should not be used as a sole basis for treatment. Nasal washings and aspirates are unacceptable for Xpert Xpress SARS-CoV-2/FLU/RSV testing.  Fact Sheet for Patients: EntrepreneurPulse.com.au  Fact Sheet for Healthcare Providers: IncredibleEmployment.be  This test is not yet approved or cleared by the [place] FDA and has been authorized for detection and/or diagnosis of SARS-CoV-2 by FDA under an Emergency Use Authorization (EUA). This EUA will remain in effect (meaning this test can be used) for the duration of  the COVID-19 declaration under Section 564(b)(1) of the Act, 21 U.S.C. section 360bbb-3(b)(1), unless the authorization is terminated or revoked.  Performed at [facility], [address] [place], [place] [address]       Radiology Studies: CT Head Wo Contrast  Result Date: 12/18/2020 CLINICAL DATA:  Headache, intracranial hemorrhage suspected EXAM: CT HEAD WITHOUT CONTRAST TECHNIQUE: Contiguous axial images were obtained from the base of the skull through the vertex without intravenous contrast. COMPARISON:  None. FINDINGS: Brain: No evidence of acute infarction, hemorrhage, hydrocephalus, extra-axial collection or mass lesion/mass effect. Vascular: No hyperdense vessel or unexpected calcification. Skull: Normal. Negative for fracture or focal lesion. Sinuses/Orbits: No acute finding. Other: None. IMPRESSION: No acute intracranial findings. Electronically Signed  By: Davina Poke D.O.   On: 12/18/2020 18:00   DG Chest Port 1 View  Result Date: 12/18/2020 CLINICAL DATA:  Chest pain. EXAM: PORTABLE CHEST 1 VIEW COMPARISON:  December 18, 2019 FINDINGS: Scarring in the right lateral lung base. No pneumothorax. The lungs are otherwise clear. Stable AICD device. The cardiomediastinal silhouette is normal. IMPRESSION: No active disease. Electronically Signed   By: Dorise Bullion III M.D.   On: 12/18/2020 14:17   ECHOCARDIOGRAM COMPLETE  Result Date: 12/19/2020    ECHOCARDIOGRAM REPORT   Patient Name:   Crystal Bryant Date of Exam: 12/19/2020 Medical Rec #:  [id number]         Height:       65.0 in Accession #:    [id number]        Weight:       145.0 lb Date of Birth:  [birth year]         BSA:          1.725 m Patient Age:    32 years          BP:           167/106 mmHg Patient Gender: F                 HR:           105 bpm. Exam Location:  [facility] Procedure: 2D Echo, Cardiac Doppler and Color Doppler Indications:    Hypertensive Urgency  History:        Patient has prior history of  Echocardiogram examinations, most                 recent 08/11/2018. CHF, Defibrillator, Arrythmias:LBBB; Risk                 Factors:Hypertension and Diabetes. Lung CA/post RT lobectomy.  Sonographer:    Wenda Low Referring Phys: Harvey  1. Left ventricular ejection fraction, by estimation, is 55 to 60%. The left ventricle has normal function. The left ventricle has no regional wall motion abnormalities. There is moderate left ventricular hypertrophy. Left ventricular diastolic parameters are consistent with Grade I diastolic dysfunction (impaired relaxation).  2. Right ventricular systolic function is normal. The right ventricular size is normal. There is normal pulmonary artery systolic pressure.  3. Left atrial size was moderately dilated.  4. The pericardial effusion is circumferential.  5. The mitral valve is normal in structure. No evidence of mitral valve regurgitation. No evidence of mitral stenosis.  6. The aortic valve is tricuspid. There is mild calcification of the aortic valve. There is mild thickening of the aortic valve. Aortic valve regurgitation is not visualized. Mild aortic valve stenosis.  7. The inferior vena cava is normal in size with greater than 50% respiratory variability, suggesting right atrial pressure of 3 mmHg. FINDINGS  Left Ventricle: Left ventricular ejection fraction, by estimation, is 55 to 60%. The left ventricle has normal function. The left ventricle has no regional wall motion abnormalities. The left ventricular internal cavity size was normal in size. There is  moderate left ventricular hypertrophy. Left ventricular diastolic parameters are consistent with Grade I diastolic dysfunction (impaired relaxation). Normal left ventricular filling pressure. Right Ventricle: The right ventricular size is normal. No increase in right ventricular wall thickness. Right ventricular systolic function is normal. There is normal pulmonary artery systolic  pressure. The tricuspid regurgitant velocity is 2.29 m/s, and  with an assumed right atrial pressure of 3 mmHg, the estimated  right ventricular systolic pressure is 33.8 mmHg. Left Atrium: Left atrial size was moderately dilated. Right Atrium: Right atrial size was normal in size. Pericardium: Trivial pericardial effusion is present. The pericardial effusion is circumferential. Mitral Valve: The mitral valve is normal in structure. There is mild thickening of the mitral valve leaflet(s). There is mild calcification of the mitral valve leaflet(s). Mild mitral annular calcification. No evidence of mitral valve regurgitation. No evidence of mitral valve stenosis. MV peak gradient, 6.6 mmHg. The mean mitral valve gradient is 3.0 mmHg. Tricuspid Valve: The tricuspid valve is normal in structure. Tricuspid valve regurgitation is mild . No evidence of tricuspid stenosis. Aortic Valve: The aortic valve is tricuspid. There is mild calcification of the aortic valve. There is mild thickening of the aortic valve. There is mild aortic valve annular calcification. Aortic valve regurgitation is not visualized. Mild aortic stenosis is present. Aortic valve mean gradient measures 9.0 mmHg. Aortic valve peak gradient measures 14.7 mmHg. Aortic valve area, by VTI measures 1.64 cm. Pulmonic Valve: The pulmonic valve was not well visualized. Pulmonic valve regurgitation is not visualized. No evidence of pulmonic stenosis. Aorta: The aortic root is normal in size and structure. Venous: The inferior vena cava is normal in size with greater than 50% respiratory variability, suggesting right atrial pressure of 3 mmHg. IAS/Shunts: No atrial level shunt detected by color flow Doppler. Additional Comments: A device lead is visualized.  LEFT VENTRICLE PLAX 2D LVIDd:         3.45 cm     Diastology LVIDs:         2.40 cm     LV e' medial:    5.33 cm/s LV PW:         1.30 cm     LV E/e' medial:  16.2 LV IVS:        1.25 cm     LV e' lateral:    8.27 cm/s LVOT diam:     2.00 cm     LV E/e' lateral: 10.5 LV SV:         63 LV SV Index:   36 LVOT Area:     3.14 cm  LV Volumes (MOD) LV vol d, MOD A2C: 50.8 ml LV vol d, MOD A4C: 52.4 ml LV vol s, MOD A2C: 21.4 ml LV vol s, MOD A4C: 26.2 ml LV SV MOD A2C:     29.4 ml LV SV MOD A4C:     52.4 ml LV SV MOD BP:      28.7 ml RIGHT VENTRICLE RV Basal diam:  2.55 cm RV Mid diam:    1.90 cm RV S prime:     12.40 cm/s TAPSE (M-mode): 2.0 cm LEFT ATRIUM             Index        RIGHT ATRIUM           Index LA diam:        4.30 cm 2.49 cm/m   RA Area:     10.40 cm LA Vol (A2C):   34.0 ml 19.71 ml/m  RA Volume:   22.40 ml  12.98 ml/m LA Vol (A4C):   62.7 ml 36.34 ml/m LA Biplane Vol: 49.2 ml 28.51 ml/m  AORTIC VALVE                     PULMONIC VALVE AV Area (Vmax):    1.72 cm      PV Vmax:  0.87 m/s AV Area (Vmean):   1.63 cm      PV Peak grad:  3.0 mmHg AV Area (VTI):     1.64 cm AV Vmax:           192.00 cm/s AV Vmean:          142.000 cm/s AV VTI:            0.381 m AV Peak Grad:      14.7 mmHg AV Mean Grad:      9.0 mmHg LVOT Vmax:         105.00 cm/s LVOT Vmean:        73.800 cm/s LVOT VTI:          0.199 m LVOT/AV VTI ratio: 0.52  AORTA Ao Root diam: 2.40 cm Ao Asc diam:  2.70 cm MITRAL VALVE                TRICUSPID VALVE MV Area (PHT): 4.36 cm     TR Peak grad:   21.0 mmHg MV Peak grad:  6.6 mmHg     TR Vmax:        229.00 cm/s MV Mean grad:  3.0 mmHg MV Vmax:       1.28 m/s     SHUNTS MV Vmean:      72.0 cm/s    Systemic VTI:  0.20 m MV Decel Time: 174 msec     Systemic Diam: 2.00 cm MV E velocity: 86.50 cm/s MV A velocity: 121.00 cm/s MV E/A ratio:  0.71 Carlyle Dolly MD Electronically signed by Carlyle Dolly MD Signature Date/Time: 12/19/2020/2:29:05 PM    Final      Scheduled Meds:  carvedilol  25 mg Oral BID WC   enoxaparin (LOVENOX) injection  40 mg Subcutaneous Q24H   gabapentin  600 mg Oral QHS   insulin aspart  0-5 Units Subcutaneous QHS   insulin aspart  0-9 Units Subcutaneous  TID WC   ivabradine  5 mg Oral BID WC   meclizine  25 mg Oral Once   potassium chloride  40 mEq Oral Daily   pravastatin  10 mg Oral q1800   prochlorperazine  25 mg Rectal Once   sacubitril-valsartan  1 tablet Oral BID   torsemide  20 mg Oral Daily   Continuous Infusions:  cefTRIAXone (ROCEPHIN)  IV Stopped (12/19/20 1523)     LOS: 1 day    Roxan Hockey M.D on 12/19/2020 at 4:30 PM  Go to [email or web address] - for contact info  [facility] - Office  [phone]  If 7PM-7AM, please contact night-coverage [email or web address] Password [facility] 12/19/2020, 4:30 PM

## 2020-12-19 NOTE — Progress Notes (Signed)
*  PRELIMINARY RESULTS* Echocardiogram 2D Echocardiogram has been performed.  Crystal Bryant 12/19/2020, 2:15 PM

## 2020-12-19 NOTE — ED Notes (Signed)
Pharmacy states the pharmacy tech has the compazine with them during their rounds. I asked if there was a number to contact the pharmacy tech and was told no.

## 2020-12-20 DIAGNOSIS — I10 Essential (primary) hypertension: Secondary | ICD-10-CM | POA: Diagnosis not present

## 2020-12-20 DIAGNOSIS — E119 Type 2 diabetes mellitus without complications: Secondary | ICD-10-CM | POA: Diagnosis not present

## 2020-12-20 DIAGNOSIS — I509 Heart failure, unspecified: Secondary | ICD-10-CM | POA: Diagnosis not present

## 2020-12-20 DIAGNOSIS — Z794 Long term (current) use of insulin: Secondary | ICD-10-CM | POA: Diagnosis not present

## 2020-12-20 DIAGNOSIS — I160 Hypertensive urgency: Secondary | ICD-10-CM | POA: Diagnosis not present

## 2020-12-20 DIAGNOSIS — Z20822 Contact with and (suspected) exposure to covid-19: Secondary | ICD-10-CM | POA: Diagnosis not present

## 2020-12-20 DIAGNOSIS — Z95810 Presence of automatic (implantable) cardiac defibrillator: Secondary | ICD-10-CM | POA: Diagnosis not present

## 2020-12-20 DIAGNOSIS — I110 Hypertensive heart disease with heart failure: Secondary | ICD-10-CM | POA: Diagnosis not present

## 2020-12-20 DIAGNOSIS — Z85118 Personal history of other malignant neoplasm of bronchus and lung: Secondary | ICD-10-CM | POA: Diagnosis not present

## 2020-12-20 LAB — CBC
HCT: 41.6 % (ref 36.0–46.0)
Hemoglobin: 13 g/dL (ref 12.0–15.0)
MCH: 27.5 pg (ref 26.0–34.0)
MCHC: 31.3 g/dL (ref 30.0–36.0)
MCV: 87.9 fL (ref 80.0–100.0)
Platelets: 362 10*3/uL (ref 150–400)
RBC: 4.73 MIL/uL (ref 3.87–5.11)
RDW: 12.8 % (ref 11.5–15.5)
WBC: 19.1 10*3/uL — ABNORMAL HIGH (ref 4.0–10.5)
nRBC: 0 % (ref 0.0–0.2)

## 2020-12-20 LAB — BASIC METABOLIC PANEL
Anion gap: 10 (ref 5–15)
BUN: 21 mg/dL — ABNORMAL HIGH (ref 6–20)
CO2: 26 mmol/L (ref 22–32)
Calcium: 9.4 mg/dL (ref 8.9–10.3)
Chloride: 102 mmol/L (ref 98–111)
Creatinine, Ser: 1.15 mg/dL — ABNORMAL HIGH (ref 0.44–1.00)
GFR, Estimated: 59 mL/min — ABNORMAL LOW (ref >60)
Glucose, Bld: 292 mg/dL — ABNORMAL HIGH (ref 70–99)
Potassium: 4.3 mmol/L (ref 3.5–5.1)
Sodium: 138 mmol/L (ref 135–145)

## 2020-12-20 LAB — GLUCOSE, CAPILLARY
Glucose-Capillary: 251 mg/dL — ABNORMAL HIGH (ref 70–99)
Glucose-Capillary: 221 mg/dL — ABNORMAL HIGH (ref 70–99)

## 2020-12-20 MED ORDER — ONDANSETRON HCL 4 MG PO TABS: 4.0000 mg | ORAL_TABLET | Freq: Every day | ORAL | 1 refills | Status: AC | PRN

## 2020-12-20 MED ORDER — CEPHALEXIN 500 MG PO CAPS: 500.0000 mg | ORAL_CAPSULE | Freq: Three times a day (TID) | ORAL | 0 refills | Status: AC

## 2020-12-20 MED ORDER — LIVING WELL WITH DIABETES BOOK: Freq: Once | Status: AC

## 2020-12-20 MED ORDER — MECLIZINE HCL 12.5 MG PO TABS
25.0000 mg | ORAL_TABLET | Freq: Once | ORAL | Status: AC
  Administered 2020-12-20: 25 mg via ORAL
  Filled 2020-12-20: qty 2

## 2020-12-20 MED ORDER — PROCHLORPERAZINE 25 MG RE SUPP
25.0000 mg | Freq: Once | RECTAL | Status: AC
  Administered 2020-12-20: 25 mg via RECTAL
  Filled 2020-12-20: qty 1

## 2020-12-20 MED ORDER — METFORMIN HCL 500 MG PO TABS: 500.0000 mg | ORAL_TABLET | Freq: Two times a day (BID) | ORAL | 11 refills | Status: AC

## 2020-12-20 MED ORDER — HUMALOG KWIKPEN 200 UNIT/ML ~~LOC~~ SOPN: 0.0000 [IU] | PEN_INJECTOR | Freq: Three times a day (TID) | SUBCUTANEOUS | 5 refills | Status: AC

## 2020-12-20 MED ORDER — ENTRESTO 97-103 MG PO TABS: 1.0000 | ORAL_TABLET | Freq: Two times a day (BID) | ORAL | 11 refills | Status: AC

## 2020-12-20 MED ORDER — AMLODIPINE BESYLATE 10 MG PO TABS: 10.0000 mg | ORAL_TABLET | Freq: Every day | ORAL | 5 refills | Status: DC

## 2020-12-20 MED ORDER — TORSEMIDE 20 MG PO TABS: 20.0000 mg | ORAL_TABLET | Freq: Every day | ORAL | 3 refills | Status: AC

## 2020-12-20 MED ORDER — MECLIZINE HCL 25 MG PO TABS: 25.0000 mg | ORAL_TABLET | Freq: Three times a day (TID) | ORAL | 0 refills | Status: AC | PRN

## 2020-12-20 MED ORDER — HYDROXYZINE HCL 10 MG PO TABS: 10.0000 mg | ORAL_TABLET | Freq: Three times a day (TID) | ORAL | 1 refills | Status: DC | PRN

## 2020-12-20 MED ORDER — AMLODIPINE BESYLATE 5 MG PO TABS: 5.0000 mg | ORAL_TABLET | Freq: Every day | ORAL | 11 refills | Status: DC

## 2020-12-20 MED ORDER — POTASSIUM CHLORIDE ER 10 MEQ PO TBCR: 10.0000 meq | EXTENDED_RELEASE_TABLET | Freq: Every day | ORAL | 2 refills | Status: DC

## 2020-12-20 MED ORDER — ATORVASTATIN CALCIUM 20 MG PO TABS: 20.0000 mg | ORAL_TABLET | Freq: Every day | ORAL | 11 refills | Status: AC

## 2020-12-20 MED ORDER — TRESIBA FLEXTOUCH 200 UNIT/ML ~~LOC~~ SOPN
16.0000 [IU] | PEN_INJECTOR | Freq: Every day | SUBCUTANEOUS | 3 refills | Status: AC
Start: 2020-12-20 — End: ?

## 2020-12-20 MED ORDER — SODIUM CHLORIDE 0.9 % IV SOLN
1.0000 g | Freq: Once | INTRAVENOUS | Status: AC
  Administered 2020-12-20: 1 g via INTRAVENOUS

## 2020-12-20 MED ORDER — CARVEDILOL 25 MG PO TABS: 50.0000 mg | ORAL_TABLET | Freq: Two times a day (BID) | ORAL | 5 refills | Status: AC

## 2020-12-20 MED ORDER — PROCHLORPERAZINE 25 MG RE SUPP
25.0000 mg | Freq: Two times a day (BID) | RECTAL | 0 refills | Status: DC
Start: 2020-12-20 — End: 2021-01-11

## 2020-12-20 NOTE — Progress Notes (Addendum)
Inpatient Diabetes Program Recommendations  AACE/ADA: New Consensus Statement on Inpatient Glycemic Control (2015)  Target Ranges:  Prepandial:   less than 140 mg/dL      Peak postprandial:   less than 180 mg/dL (1-2 hours)      Critically ill patients:  140 - 180 mg/dL   Lab Results  Component Value Date   GLUCAP 251 (H) 12/20/2020   HGBA1C 10.5 (H) 12/19/2020    Review of Glycemic Control  Latest Reference Range & Units 12/19/20 08:36 12/19/20 11:32 12/19/20 17:56 12/19/20 20:39 12/20/20 07:36  Glucose-Capillary 70 - 99 mg/dL 224 (H) 299 (H) 268 (H) 297 (H) 251 (H)  (H): Data is abnormally high   Inpatient Diabetes Program Recommendations:    Semglee was held last evening for N/V.  CBG's remain elevated.  Please consider administering Semgle 15 units now and then QD.  Addendum@[id number] : Spoke with patient on the phone regarding DM management.  Confirms above home medications. Reviewed patient's current A1c of 10.5% (average blood sugar of 254 mg/dL). Explained what a A1c is and what it measures. Also reviewed goal A1c with patient, importance of good glucose control @ home, and blood sugar goals.  She states her last A1C was 12%.  She has been speaking with a health coach at work and is trying to change her eating/drinking habits.  She states her morning CBG's are consistently in the mid 200's.  Explained the likely need for basal insulin.  She verbalizes understanding.  She has taken Metformin in the past.  Ordered LWWD booklet.       Will continue to follow while inpatient.  Thank you, Crystal Dixon, RN, BSN Diabetes Coordinator Inpatient Diabetes Program [phone] (team pager from 8a-5p)

## 2020-12-20 NOTE — Discharge Instructions (Signed)
1)Humalog Sliding Scale Insulin Coverage:--- --insulin aspart (novoLOG) injection 0-15 Units 0-15 Units Subcutaneous, 3 times daily with meals CBG < 70: Implement Hypoglycemia Standing Orders and refer to Hypoglycemia Standing Orders sidebar report  CBG 70 - 120: 0 unit CBG 121 - 150: 2 unit  CBG 151 - 200: 4 unit CBG 201 - 250: 6 units CBG 251 - 300: 9 units CBG 301 - 350: 11 units  CBG 351 - 400: 13 units  CBG > 400: 15 units  2) follow up with Endocrinologist Dr. Loni Beckwith, MD, Endocrinology, Diabetes & Metabolism-- [facility], [address] [place], [place] [address], Phone Number- [phone] in 2 to 3 weeks for diabetic adjustments  3)Very low-salt diet advised, Avoid Sweets/Sugars

## 2020-12-20 NOTE — Discharge Summary (Signed)
Crystal Bryant, is a 50 y.o. female  DOB 1970/11/20  MRN [id number].  Admission date:  12/18/2020  Admitting Physician  Roxan Hockey, MD  Discharge Date:  12/20/2020   Primary MD  Shelda Pal, DO  Recommendations for primary care physician for things to follow:   1)Humalog Sliding Scale Insulin Coverage:--- --insulin aspart (novoLOG) injection 0-15 Units 0-15 Units Subcutaneous, 3 times daily with meals CBG < 70: Implement Hypoglycemia Standing Orders and refer to Hypoglycemia Standing Orders sidebar report  CBG 70 - 120: 0 unit CBG 121 - 150: 2 unit  CBG 151 - 200: 4 unit CBG 201 - 250: 6 units CBG 251 - 300: 9 units CBG 301 - 350: 11 units  CBG 351 - 400: 13 units  CBG > 400: 15 units  2) follow up with Endocrinologist Dr. Loni Beckwith, MD, Endocrinology, Diabetes & Metabolism-- [facility], [place], [place] [address], Phone Number- [phone] in 2 to 3 weeks for diabetic adjustments  3)Very low-salt diet advised, Avoid Sweets/Sugars   Admission Diagnosis  Hypertensive crisis [I16.9] Hypertensive urgency [I16.0]   Discharge Diagnosis  Hypertensive crisis [I16.9] Hypertensive urgency [I16.0]    Principal Problem:   Hypertensive urgency Active Problems:   Essential hypertension, benign   Biventricular ICD (implantable cardioverter-defibrillator) in place   LBBB (left bundle branch block)   Diabetes mellitus ([place])   Hypertensive crisis      Past Medical History:  Diagnosis Date   Anxiety    CHF (congestive heart failure) ([place])    Diabetes mellitus without complication ([place])    Hypertension    Lung cancer, upper lobe ([place]) 09/2015   Non-small cell in right upper lobe treated with RULectomy by VATS at [facility]    Past Surgical History:  Procedure Laterality Date   ABDOMINAL HYSTERECTOMY     BIV ICD INSERTION CRT-D N/A 12/04/2018   Procedure: BIV ICD  INSERTION CRT-D;  Surgeon: Evans Lance, MD;  Location: [facility];  Service: Cardiovascular;  Laterality: N/A;   BREAST BIOPSY Right 02/10/2018   Biopsy of upper outer quadrant of right breast due to calcifications seen on mammography revealed fat necrosis with calcifications.  Benign so continue annual screening mammograms.   BREAST SURGERY     CESAREAN SECTION     LEAD REVISION/REPAIR N/A 03/26/2019   Procedure: LEAD REVISION/REPAIR;  Surgeon: Evans Lance, MD;  Location: [facility];  Service: Cardiovascular;  Laterality: N/A;   LOBECTOMY Right 10/06/2015   [place] lung cancer treated with VATS right upper lobectomy by Dr. [name] at [facility].      HPI  from the history and physical done on the day of admission:    Patient Coming From: home   Chief Complaint: Dizziness, palpitations, chest pain   HPI: Crystal Bryant is a 50 y.o. female with a history of right upper lobe lung cancer status postresection, diabetes, hypertension, CHF.  Patient seen for chest pain, palpitations, and dizziness that has been intermittent since Wednesday but became persistent  and severe this morning.  Initially, the symptoms lasted for a few minutes and then went away.  When it became persistent, the patient came to emergency department for evaluation.  She has been compliant with her medications and has had no recent changes to them.   Emergency Department Course: Initial blood pressure 260/133.  Patient given labetalol 5 mg IV x2 doses and Nitropaste which brought the patient into the 190/1 10-2 10/129 range.   Review of Systems:    Pt denies any fevers, chills, nausea, vomiting, diarrhea, constipation, abdominal pain, shortness of breath, dyspnea on exertion, orthopnea, cough, wheezing, palpitations, headache, vision changes, lightheadedness, dizziness, melena, rectal bleeding.  Review of systems are otherwise negative     Hospital Course:     Brief Narrative:  50 y.o. female with a  history of right upper lobe lung cancer status postresection, diabetes, hypertension, CHF admittedon 12/18/20  with uncontrolled hypertension, persistent dizziness, vertigo, palpitations, chest discomfort and nausea with emesis   A/p 1)Hypertensive Urgency--- CT head without acute findings -Echo showed improved EF up to 55 to 60% from 30-35 % patient previously, diastolic dysfunction and moderately enlarged left atrium -BP improved significantly with interventions -No further emesis, dizziness much improved, -Patient steady with ambulation at this time -Discharge on amlodipine and Coreg, as well as Entresto and torsemide   2)HFpEF--patient with history of combined systolic and diastolic dysfunction CHF -Echo improved as noted above #1 -No overt CHF decompensation/exacerbation at this time -Continue torsemide and Entresto, as well as Coreg   3) E. coli UTI--- treated with IV Rocephin, discharged on p.o. Keflex    4) intractable emesis with persistentdizziness,/vertigo ---  -Much improved overall okay to discharge on Zofran and meclizine - -Tolerating oral intake well   5)DM2-A1c is 11.5 reflecting uncontrolled DM with hyperglycemia PTA -Discharged on Tresiba and sliding scale insulin coverage -Outpatient follow-up with endocrinology strongly advised   6) right upper lobe lung cancer status postresection--- ongoing outpatient follow-up/surveillance advised -Follow-up with oncologist as previously arranged   Disposition--- Home   Disposition: The patient is from: Home              Anticipated d/c is to: Home                 Code Status :  -  Code Status: Full Code    Family Communication:    NA (patient is alert, awake and coherent)   Discharge Condition: stable  Follow UP   Follow-up Information     Cassandria Anger, MD. Schedule an appointment as soon as possible for a visit in 2 week(s).   Specialty: Endocrinology Contact information: [place] [address] [phone]                  Diet and Activity recommendation:  As advised  Discharge Instructions    Discharge Instructions     Call MD for:  difficulty breathing, headache or visual disturbances   Complete by: As directed    Call MD for:  persistant dizziness or light-headedness   Complete by: As directed    Call MD for:  persistant nausea and vomiting   Complete by: As directed    Call MD for:  temperature >100.4   Complete by: As directed    Diet - low sodium heart healthy   Complete by: As directed    Diet Carb Modified   Complete by: As directed    Discharge instructions   Complete by:  As directed    1)Humalog Sliding Scale Insulin Coverage:--- --insulin aspart (novoLOG) injection 0-15 Units 0-15 Units Subcutaneous, 3 times daily with meals CBG < 70: Implement Hypoglycemia Standing Orders and refer to Hypoglycemia Standing Orders sidebar report  CBG 70 - 120: 0 unit CBG 121 - 150: 2 unit  CBG 151 - 200: 4 unit CBG 201 - 250: 6 units CBG 251 - 300: 9 units CBG 301 - 350: 11 units  CBG 351 - 400: 13 units  CBG > 400: 15 units  2) follow up with Endocrinologist Dr. Loni Beckwith, MD, Endocrinology, Diabetes & Metabolism-- [facility], [address] [place], [place] [address], Phone Number- [phone] in 2 to 3 weeks for diabetic adjustments  3)Very low-salt diet advised, Avoid Sweets/Sugars   Increase activity slowly   Complete by: As directed          Discharge Medications     Allergies as of 12/20/2020   No Known Allergies      Medication List     STOP taking these medications    Basaglar KwikPen 100 UNIT/ML Replaced by: Tyler Aas FlexTouch 200 UNIT/ML FlexTouch Pen   HumaLOG KwikPen 100 UNIT/ML KwikPen Generic drug: insulin lispro Replaced by: HumaLOG KwikPen 200 UNIT/ML KwikPen   insulin detemir 100 UNIT/ML FlexPen Commonly known as: LEVEMIR   metFORMIN 500 MG 24 hr tablet Commonly known as: GLUCOPHAGE-XR Replaced by:  metFORMIN 500 MG tablet   potassium chloride 20 MEQ/15ML (10%) Soln Replaced by: potassium chloride 10 MEQ tablet   pravastatin 10 MG tablet Commonly known as: PRAVACHOL   venlafaxine XR 37.5 MG 24 hr capsule Commonly known as: EFFEXOR-XR       TAKE these medications    amLODipine 10 MG tablet Commonly known as: NORVASC Take 1 tablet (10 mg total) by mouth daily. Start taking on: December 21, 2020 What changed:  medication strength how much to take   atorvastatin 20 MG tablet Commonly known as: Lipitor Take 1 tablet (20 mg total) by mouth daily.   carvedilol 25 MG tablet Commonly known as: COREG Take 2 tablets (50 mg total) by mouth 2 (two) times daily with a meal. What changed:  how much to take how to take this when to take this   cephALEXin 500 MG capsule Commonly known as: Keflex Take 1 capsule (500 mg total) by mouth 3 (three) times daily for 5 days.   Dexcom G5 Receiver Kit Devi Use daily to check blood sugar.  DXE11.9   Dexcom G6 Receiver Devi USE DAILY TO CHECK BLOOD SUGAR.   Dexcom G6 Sensor Misc USE DAILY TO CHECK BLOOD SUGAR   Dexcom G6 Transmitter Misc USE TO CHECK BLOOD SUGAR.   Entresto 97-103 MG Generic drug: sacubitril-valsartan Take 1 tablet by mouth 2 (two) times daily.   freestyle lancets Use as instructed   FreeStyle Lite Devi Use As Directed   FREESTYLE LITE test strip Generic drug: glucose blood Use as instructed   gabapentin 300 MG capsule Commonly known as: NEURONTIN Take 2 capsules (600 mg total) by mouth at bedtime. What changed: Another medication with the same name was removed. Continue taking this medication, and follow the directions you see here.   HumaLOG KwikPen 200 UNIT/ML KwikPen Generic drug: insulin lispro Inject 0-15 Units into the skin 4 (four) times daily -  before meals and at bedtime. insulin aspart (novoLOG) injection 0-15 Units 0-15 Units Subcutaneous, 3 times daily with meals CBG < 70: Implement  Hypoglycemia Standing Orders and refer to Hypoglycemia  Standing Orders sidebar report  CBG 70 - 120: 0 unit CBG 121 - 150: 2 unit  CBG 151 - 200: 4 unit CBG 201 - 250: 6 units CBG 251 - 300: 9 units CBG 301 - 350: 11 units  CBG 351 - 400: 13 units  CBG > 400: 15 units Replaces: HumaLOG KwikPen 100 UNIT/ML KwikPen   hydrOXYzine 10 MG tablet Commonly known as: ATARAX/VISTARIL Take 1 tablet (10 mg total) by mouth 3 (three) times daily as needed for itching or anxiety. What changed:  how much to take how to take this when to take this reasons to take this   meclizine 25 MG tablet Commonly known as: ANTIVERT Take 1 tablet (25 mg total) by mouth 3 (three) times daily as needed for dizziness.   metFORMIN 500 MG tablet Commonly known as: Glucophage Take 1 tablet (500 mg total) by mouth 2 (two) times daily with a meal. Replaces: metFORMIN 500 MG 24 hr tablet   ondansetron 4 MG tablet Commonly known as: Zofran Take 1 tablet (4 mg total) by mouth daily as needed for nausea or vomiting.   potassium chloride 10 MEQ tablet Commonly known as: KLOR-CON Take 1 tablet (10 mEq total) by mouth daily. Take While taking Torsemide/Demadex Replaces: potassium chloride 20 MEQ/15ML (10%) Soln   prochlorperazine 25 MG suppository Commonly known as: COMPAZINE Place 1 suppository (25 mg total) rectally 2 (two) times daily.   torsemide 20 MG tablet Commonly known as: DEMADEX Take 1 tablet (20 mg total) by mouth daily.   Tyler Aas FlexTouch 200 UNIT/ML FlexTouch Pen Generic drug: insulin degludec Inject 16 Units into the skin at bedtime. Replaces: Basaglar KwikPen 100 UNIT/ML   Unifine Pentips 31G X 6 MM Misc Generic drug: Insulin Pen Needle USE AS DIRECTED 4 TIMES DAILY        Major procedures and Radiology Reports - PLEASE review detailed and final reports for all details, in brief -   CT Head Wo Contrast  Result Date: 12/18/2020 CLINICAL DATA:  Headache, intracranial hemorrhage suspected  EXAM: CT HEAD WITHOUT CONTRAST TECHNIQUE: Contiguous axial images were obtained from the base of the skull through the vertex without intravenous contrast. COMPARISON:  None. FINDINGS: Brain: No evidence of acute infarction, hemorrhage, hydrocephalus, extra-axial collection or mass lesion/mass effect. Vascular: No hyperdense vessel or unexpected calcification. Skull: Normal. Negative for fracture or focal lesion. Sinuses/Orbits: No acute finding. Other: None. IMPRESSION: No acute intracranial findings. Electronically Signed   By: Davina Poke D.O.   On: 12/18/2020 18:00   DG Chest Port 1 View  Result Date: 12/18/2020 CLINICAL DATA:  Chest pain. EXAM: PORTABLE CHEST 1 VIEW COMPARISON:  December 18, 2019 FINDINGS: Scarring in the right lateral lung base. No pneumothorax. The lungs are otherwise clear. Stable AICD device. The cardiomediastinal silhouette is normal. IMPRESSION: No active disease. Electronically Signed   By: Dorise Bullion III M.D.   On: 12/18/2020 14:17   ECHOCARDIOGRAM COMPLETE  Result Date: 12/19/2020    ECHOCARDIOGRAM REPORT   Patient Name:   Crystal Bryant Date of Exam: 12/19/2020 Medical Rec #:  [id number]         Height:       65.0 in Accession #:    [id number]        Weight:       145.0 lb Date of Birth:  [birth year]         BSA:          1.725 m Patient Age:    11 years  BP:           167/106 mmHg Patient Gender: F                 HR:           105 bpm. Exam Location:  [facility] Procedure: 2D Echo, Cardiac Doppler and Color Doppler Indications:    Hypertensive Urgency  History:        Patient has prior history of Echocardiogram examinations, most                 recent 08/11/2018. CHF, Defibrillator, Arrythmias:LBBB; Risk                 Factors:Hypertension and Diabetes. Lung CA/post RT lobectomy.  Sonographer:    Wenda Low Referring Phys: [place]  1. Left ventricular ejection fraction, by estimation, is 55 to 60%. The left ventricle has  normal function. The left ventricle has no regional wall motion abnormalities. There is moderate left ventricular hypertrophy. Left ventricular diastolic parameters are consistent with Grade I diastolic dysfunction (impaired relaxation).  2. Right ventricular systolic function is normal. The right ventricular size is normal. There is normal pulmonary artery systolic pressure.  3. Left atrial size was moderately dilated.  4. The pericardial effusion is circumferential.  5. The mitral valve is normal in structure. No evidence of mitral valve regurgitation. No evidence of mitral stenosis.  6. The aortic valve is tricuspid. There is mild calcification of the aortic valve. There is mild thickening of the aortic valve. Aortic valve regurgitation is not visualized. Mild aortic valve stenosis.  7. The inferior vena cava is normal in size with greater than 50% respiratory variability, suggesting right atrial pressure of 3 mmHg. FINDINGS  Left Ventricle: Left ventricular ejection fraction, by estimation, is 55 to 60%. The left ventricle has normal function. The left ventricle has no regional wall motion abnormalities. The left ventricular internal cavity size was normal in size. There is  moderate left ventricular hypertrophy. Left ventricular diastolic parameters are consistent with Grade I diastolic dysfunction (impaired relaxation). Normal left ventricular filling pressure. Right Ventricle: The right ventricular size is normal. No increase in right ventricular wall thickness. Right ventricular systolic function is normal. There is normal pulmonary artery systolic pressure. The tricuspid regurgitant velocity is 2.29 m/s, and  with an assumed right atrial pressure of 3 mmHg, the estimated right ventricular systolic pressure is 62.7 mmHg. Left Atrium: Left atrial size was moderately dilated. Right Atrium: Right atrial size was normal in size. Pericardium: Trivial pericardial effusion is present. The pericardial effusion is  circumferential. Mitral Valve: The mitral valve is normal in structure. There is mild thickening of the mitral valve leaflet(s). There is mild calcification of the mitral valve leaflet(s). Mild mitral annular calcification. No evidence of mitral valve regurgitation. No evidence of mitral valve stenosis. MV peak gradient, 6.6 mmHg. The mean mitral valve gradient is 3.0 mmHg. Tricuspid Valve: The tricuspid valve is normal in structure. Tricuspid valve regurgitation is mild . No evidence of tricuspid stenosis. Aortic Valve: The aortic valve is tricuspid. There is mild calcification of the aortic valve. There is mild thickening of the aortic valve. There is mild aortic valve annular calcification. Aortic valve regurgitation is not visualized. Mild aortic stenosis is present. Aortic valve mean gradient measures 9.0 mmHg. Aortic valve peak gradient measures 14.7 mmHg. Aortic valve area, by VTI measures 1.64 cm. Pulmonic Valve: The pulmonic valve was not well visualized. Pulmonic valve regurgitation is not  visualized. No evidence of pulmonic stenosis. Aorta: The aortic root is normal in size and structure. Venous: The inferior vena cava is normal in size with greater than 50% respiratory variability, suggesting right atrial pressure of 3 mmHg. IAS/Shunts: No atrial level shunt detected by color flow Doppler. Additional Comments: A device lead is visualized.  LEFT VENTRICLE PLAX 2D LVIDd:         3.45 cm     Diastology LVIDs:         2.40 cm     LV e' medial:    5.33 cm/s LV PW:         1.30 cm     LV E/e' medial:  16.2 LV IVS:        1.25 cm     LV e' lateral:   8.27 cm/s LVOT diam:     2.00 cm     LV E/e' lateral: 10.5 LV SV:         63 LV SV Index:   36 LVOT Area:     3.14 cm  LV Volumes (MOD) LV vol d, MOD A2C: 50.8 ml LV vol d, MOD A4C: 52.4 ml LV vol s, MOD A2C: 21.4 ml LV vol s, MOD A4C: 26.2 ml LV SV MOD A2C:     29.4 ml LV SV MOD A4C:     52.4 ml LV SV MOD BP:      28.7 ml RIGHT VENTRICLE RV Basal diam:  2.55 cm  RV Mid diam:    1.90 cm RV S prime:     12.40 cm/s TAPSE (M-mode): 2.0 cm LEFT ATRIUM             Index        RIGHT ATRIUM           Index LA diam:        4.30 cm 2.49 cm/m   RA Area:     10.40 cm LA Vol (A2C):   34.0 ml 19.71 ml/m  RA Volume:   22.40 ml  12.98 ml/m LA Vol (A4C):   62.7 ml 36.34 ml/m LA Biplane Vol: 49.2 ml 28.51 ml/m  AORTIC VALVE                     PULMONIC VALVE AV Area (Vmax):    1.72 cm      PV Vmax:       0.87 m/s AV Area (Vmean):   1.63 cm      PV Peak grad:  3.0 mmHg AV Area (VTI):     1.64 cm AV Vmax:           192.00 cm/s AV Vmean:          142.000 cm/s AV VTI:            0.381 m AV Peak Grad:      14.7 mmHg AV Mean Grad:      9.0 mmHg LVOT Vmax:         105.00 cm/s LVOT Vmean:        73.800 cm/s LVOT VTI:          0.199 m LVOT/AV VTI ratio: 0.52  AORTA Ao Root diam: 2.40 cm Ao Asc diam:  2.70 cm MITRAL VALVE                TRICUSPID VALVE MV Area (PHT): 4.36 cm     TR Peak grad:   21.0 mmHg MV Peak grad:  6.6 mmHg  TR Vmax:        229.00 cm/s MV Mean grad:  3.0 mmHg MV Vmax:       1.28 m/s     SHUNTS MV Vmean:      72.0 cm/s    Systemic VTI:  0.20 m MV Decel Time: 174 msec     Systemic Diam: 2.00 cm MV E velocity: 86.50 cm/s MV A velocity: 121.00 cm/s MV E/A ratio:  0.71 Carlyle Dolly MD Electronically signed by Carlyle Dolly MD Signature Date/Time: 12/19/2020/2:29:05 PM    Final    CUP PACEART REMOTE DEVICE CHECK  Result Date: 12/06/2020 Scheduled remote reviewed. Normal device function.  Note to keep RV subthreshold Next remote 91 days. LR   Micro Results   Recent Results (from the past 240 hour(s))  Resp Panel by RT-PCR (Flu A&B, Covid) Nasopharyngeal Swab     Status: None   Collection Time: 12/18/20  5:30 PM   Specimen: Nasopharyngeal Swab; Nasopharyngeal(NP) swabs in vial transport medium  Result Value Ref Range Status   SARS Coronavirus 2 by RT PCR NEGATIVE NEGATIVE Final    Comment: (NOTE) SARS-CoV-2 target nucleic acids are NOT DETECTED.  The  SARS-CoV-2 RNA is generally detectable in upper respiratory specimens during the acute phase of infection. The lowest concentration of SARS-CoV-2 viral copies this assay can detect is 138 copies/mL. A negative result does not preclude SARS-Cov-2 infection and should not be used as the sole basis for treatment or other patient management decisions. A negative result may occur with  improper specimen collection/handling, submission of specimen other than nasopharyngeal swab, presence of viral mutation(s) within the areas targeted by this assay, and inadequate number of viral copies(<138 copies/mL). A negative result must be combined with clinical observations, patient history, and epidemiological information. The expected result is Negative.  Fact Sheet for Patients:  EntrepreneurPulse.com.au  Fact Sheet for Healthcare Providers:  IncredibleEmployment.be  This test is no t yet approved or cleared by the [place] FDA and  has been authorized for detection and/or diagnosis of SARS-CoV-2 by FDA under an Emergency Use Authorization (EUA). This EUA will remain  in effect (meaning this test can be used) for the duration of the COVID-19 declaration under Section 564(b)(1) of the Act, 21 U.S.C.section 360bbb-3(b)(1), unless the authorization is terminated  or revoked sooner.       Influenza A by PCR NEGATIVE NEGATIVE Final   Influenza B by PCR NEGATIVE NEGATIVE Final    Comment: (NOTE) The Xpert Xpress SARS-CoV-2/FLU/RSV plus assay is intended as an aid in the diagnosis of influenza from Nasopharyngeal swab specimens and should not be used as a sole basis for treatment. Nasal washings and aspirates are unacceptable for Xpert Xpress SARS-CoV-2/FLU/RSV testing.  Fact Sheet for Patients: EntrepreneurPulse.com.au  Fact Sheet for Healthcare Providers: IncredibleEmployment.be  This test is not yet approved or  cleared by the [place] FDA and has been authorized for detection and/or diagnosis of SARS-CoV-2 by FDA under an Emergency Use Authorization (EUA). This EUA will remain in effect (meaning this test can be used) for the duration of the COVID-19 declaration under Section 564(b)(1) of the Act, 21 U.S.C. section 360bbb-3(b)(1), unless the authorization is terminated or revoked.  Performed at [facility], [address] [place], [place] [address]        Today   Crystal Bryant today has no new complaints  No fever  Or chills   No Nausea, Vomiting or Diarrhea  Patient has been seen and examined prior to discharge   Objective   Blood pressure (!) 157/99, pulse 96, temperature 98.1 F (36.7 C), temperature source Oral, resp. rate 18, height '5\' 5"'  (1.651 m), weight 62.7 kg, SpO2 98 %.   Intake/Output Summary (Last 24 hours) at 12/20/2020 1114 Last data filed at 12/19/2020 1523 Gross per 24 hour  Intake 100 ml  Output --  Net 100 ml    Exam Gen:- Awake Alert, no acute distress  HEENT:- [place].AT, No sclera icterus Neck-Supple Neck,No JVD,.  Lungs-  CTAB , good air movement bilaterally  CV- S1, S2 normal, regular Abd-  +ve B.Sounds, Abd Soft, No tenderness,    Extremity/Skin:- No  edema,   good pulses Psych-affect is appropriate, oriented x3 Neuro-no new focal deficits, no tremors    Data Review   CBC w Diff:  Lab Results  Component Value Date   WBC 19.1 (H) 12/20/2020   HGB 13.0 12/20/2020   HGB 13.6 03/23/2019   HCT 41.6 12/20/2020   HCT 40.3 03/23/2019   PLT 362 12/20/2020   PLT 280 03/23/2019   LYMPHOPCT 52 11/18/2018   MONOPCT 5 11/18/2018   EOSPCT 2 11/18/2018   BASOPCT 1 11/18/2018    CMP:  Lab Results  Component Value Date   NA 138 12/20/2020   NA 136 03/23/2019   K 4.3 12/20/2020   CL 102 12/20/2020   CO2 26 12/20/2020   BUN 21 (H) 12/20/2020   BUN 12 03/23/2019   CREATININE 1.15 (H) 12/20/2020   PROT 7.6  12/18/2019   PROT 7.1 01/05/2017   ALBUMIN 4.0 12/18/2019   ALBUMIN 4.0 01/05/2017   BILITOT 0.5 12/18/2019   BILITOT <0.2 01/05/2017   ALKPHOS 151 (H) 12/18/2019   AST 13 12/18/2019   ALT 14 12/18/2019  .   Total Discharge time is about 33 minutes  Roxan Hockey M.D on 12/20/2020 at 11:14 AM  Go to [email or web address] -  for contact info  [facility] - Office  [phone]

## 2020-12-21 ENCOUNTER — Other Ambulatory Visit: Payer: Self-pay

## 2020-12-21 DIAGNOSIS — I10 Essential (primary) hypertension: Secondary | ICD-10-CM

## 2020-12-21 DIAGNOSIS — E1165 Type 2 diabetes mellitus with hyperglycemia: Secondary | ICD-10-CM

## 2020-12-21 LAB — URINE CULTURE: Culture: >=100000 — AB

## 2020-12-26 ENCOUNTER — Telehealth: Payer: Self-pay | Admitting: *Deleted

## 2020-12-26 NOTE — Chronic Care Management (AMB) (Signed)
  Care Management   Note  12/26/2020 Name: [name] MRN: [id number] DOB: [birth year]  Crystal Bryant is a 50 y.o. year old female who is a primary care patient of Shelda Pal, DO. I reached out to [facility] by phone today in response to a referral sent by Crystal Bryant primary care provider.   Ms. O[place]ltree was given information about care management services today including:  Care management services include personalized support from designated clinical staff supervised by her physician, including individualized plan of care and coordination with other care providers 24/7 contact phone numbers for assistance for urgent and routine care needs. The patient may stop care management services at any time by phone call to the office staff.  Patient agreed to services and verbal consent obtained.   Follow up plan: Telephone appointment with care management team member scheduled for: 01/06/2021  Julian Hy, [place]  Direct Dial: [phone]

## 2021-01-05 ENCOUNTER — Telehealth: Payer: Self-pay | Admitting: Family Medicine

## 2021-01-05 NOTE — Telephone Encounter (Signed)
Papers faxed into front office Printed out & placed in bin up front for provider to see

## 2021-01-06 ENCOUNTER — Telehealth: Payer: 59

## 2021-01-09 ENCOUNTER — Ambulatory Visit: Payer: 59

## 2021-01-09 NOTE — Patient Instructions (Addendum)
Visit Information  Thank you for taking time to visit with me today. Please don't hesitate to contact me if I can be of assistance to you before our next scheduled telephone appointment.  Following are the goals we discussed today:  Patient Goals/Self-Care Activities: Check and record blood pressure daily and notify provider if outside recommended parameters Ask your doctor what is your Target Blood Pressure Check Blood Sugar as prescribed by provider and if you feel your blood sugar is too low or too high Take medications as prescribed   Call provider office for new concerns or questions  Work with the social worker to address care coordination needs and will continue to work with the clinical team to address health care and disease management related needs Contact your endocrinologist to schedule an appointment. manage portion size Do not drink sugared drinks Weigh and record weights daily  Our next appointment is by telephone on 01/31/21 at 2:00 pm  Please call the care guide team at [phone] if you need to cancel or reschedule your appointment.   If you are experiencing a Mental Health or [place] or need someone to talk to, please call the Suicide and Crisis Lifeline: 988 call 1-800-273-TALK (toll free, 24 hour hotline)   Following is a copy of your full plan of care:  Care Plan : RN Care Management Plan of Care  Updates made by Luretha Rued, RN since 01/09/2021 12:00 AM     Problem: No Plan of Care Established for Management of Chronic Disease   Priority: High     Long-Range Goal: Development of Plan of Care for Disease Management and Care Coodination needs.   Start Date: 01/09/2021  Expected End Date: 04/09/2021  Priority: High  Note:   Current Barriers: Recent hospitalization for hypertensive urgency. She reports she does not routinely check blood pressure and last checked a couple days ago was 185/70-80. She reports she has a visit scheduled with  primary care provider for 01/11/21. She also reports some vertigo at times, treated with meclizine. Crystal Bryant was teary when discussing expressing fear of the unknown.  She does not check blood sugar as prescribed. She reports she checks blood sugar daily. This morning blood sugar was 211, but adds she ate late last night about 11:00 pm 6" sub sandwich/chips/coke zero. She also re PHQ2 -5, [id number] Patient expresses loss of her Grandmother last month and her daughter, who lives in [place] apartment just caught on fire last week. Stress of chronic illness. Knowledge Deficits related to plan of care for management of CHF, HTN, and DMII  Care Coordination needs related to Medication procurement and Mental Health Concerns   Chronic Disease Management support and education needs related to CHF, HTN, and DMII  RNCM Clinical Goal(s):  Patient will verbalize understanding of plan for management of CHF, HTN, and DMII as evidenced by self reports and/or chart notation demonstrate improved adherence to prescribed treatment plan for CHF, HTN, and DMII as evidenced by attending provider visits as scheduled, taking medications as prescribed, monitoring and recording blood pressure and blood glucose as recommended, adherence to prescribed diet work with pharmacist to address Medication procurement related to CHF, HTN, and DMII as evidenced by review of EMR and patient or pharmacist report    work with Education officer, museum to address [place] Concerns  related to the management of CHF, HTN, and DMII as evidenced by review of EMR and patient or social worker report     through collaboration with  RN Care manager, provider, and care team.   Interventions: collaboration with primary care provider regarding development and update of comprehensive plan of care as evidenced by provider attestation and co-signature Inter-disciplinary care team collaboration (see longitudinal plan of care) Evaluation of current treatment  plan related to  self management and patient's adherence to plan as established by provider  Diabetes Interventions:  (Status:  New goal.) Long Term Goal Assessed patient's understanding of A1c goal: <7% Reviewed medications with patient and discussed importance of medication adherence Counseled on importance of regular laboratory monitoring as prescribed Provided patient with written educational materials related to hypo and hyperglycemia and importance of correct treatment Referral made to pharmacy team for assistance with medication review Referral made to social work team for assistance with mental health needs Screening for signs and symptoms of depression related to chronic disease state  Lab Results  Component Value Date   HGBA1C 10.5 (H) 12/19/2020  Hypertension Interventions:  (Status:  New goal.) Long Term Goal Last practice recorded BP readings:  BP Readings from Last 3 Encounters:  12/20/20 (!) 157/99  11/10/20 130/80  02/16/20 (!) 220/120  Most recent eGFR/CrCl: No results found for: EGFR  No components found for: CRCL  Evaluation of current treatment plan related to hypertension self management and patient's adherence to plan as established by provider Provided education to patient re: stroke prevention, s/s of heart attack and stroke Reviewed medications with patient and discussed importance of compliance Discussed plans with patient for ongoing care management follow up and provided patient with direct contact information for care management team Advised patient, providing education and rationale, to monitor blood pressure daily and record, calling PCP for findings outside established parameters Advised patient to discuss blood pressure parameters with provider Screening for signs and symptoms of depression related to chronic disease state  Discussed complications of poorly controlled blood pressure such as heart disease, stroke, kidney problems a     Discussed best way  to get most accurate consistent blood pressure readings.  Heart Failure Interventions:  (Status:  New goal.) Long Term Goal Assessed need for readable accurate scales in home Discussed importance of daily weight and advised patient to weigh and record daily Discussed the importance of keeping all appointments with provider  Discussed signs/symptoms of heart failure exacerbation  Patient Goals/Self-Care Activities: Check, record blood pressure daily and notify provider if outside recommended parameters. Take readings with your to provider visits. Ask your doctor what is your Target Blood Pressure Check Blood Sugar and record as prescribed by provider and if you feel your blood sugar is too low or too high. Take readings with you to provider visits. Take medications as prescribed   Call provider office for new concerns or questions  Work with the social worker to address care coordination needs and will continue to work with the clinical team to address health care and disease management related needs Contact your endocrinologist to schedule an appointment. manage portion size Do not drink sugared drinks Weigh and record weights daily       Crystal Bryant was given information about Care Management services by the embedded care coordination team including:  Care Management services include personalized support from designated clinical staff supervised by her physician, including individualized plan of care and coordination with other care providers 24/7 contact phone numbers for assistance for urgent and routine care needs. The patient may stop CCM services at any time (effective at the end of the month) by phone call to the office  staff.  Patient agreed to services and verbal consent obtained.   Patient verbalizes understanding of instructions provided today and agrees to view in [place].   Telephone follow up appointment with care management team member scheduled for: 01/31/21 The  patient has been provided with contact information for the care management team and has been advised to call with any health related questions or concerns.   Thea Silversmith, RN, MSN, BSN, CCM Care Management Coordinator [facility] [phone]    Hypertension, Adult Hypertension is another name for high blood pressure. High blood pressure forces your heart to work harder to pump blood. This can cause problems over time. There are two numbers in a blood pressure reading. There is a top number (systolic) over a bottom number (diastolic). It is best to have a blood pressure that is below 120/80. Healthy choices can help lower your blood pressure, or you may need medicine to help lower it. What are the causes? The cause of this condition is not known. Some conditions may be related to high blood pressure. What increases the risk? Smoking. Having type 2 diabetes mellitus, high cholesterol, or both. Not getting enough exercise or physical activity. Being overweight. Having too much fat, sugar, calories, or salt (sodium) in your diet. Drinking too much alcohol. Having long-term (chronic) kidney disease. Having a family history of high blood pressure. Age. Risk increases with age. Race. You may be at higher risk if you are African American. Gender. Men are at higher risk than women before age 68. After age 70, women are at higher risk than men. Having obstructive sleep apnea. Stress. What are the signs or symptoms? High blood pressure may not cause symptoms. Very high blood pressure (hypertensive crisis) may cause: Headache. Feelings of worry or nervousness (anxiety). Shortness of breath. Nosebleed. A feeling of being sick to your stomach (nausea). Throwing up (vomiting). Changes in how you see. Very bad chest pain. Seizures. How is this treated? This condition is treated by making healthy lifestyle changes, such as: Eating healthy foods. Exercising more. Drinking less  alcohol. Your health care provider may prescribe medicine if lifestyle changes are not enough to get your blood pressure under control, and if: Your top number is above 130. Your bottom number is above 80. Your personal target blood pressure may vary. Follow these instructions at home: Eating and drinking  If told, follow the DASH eating plan. To follow this plan: Fill one half of your plate at each meal with fruits and vegetables. Fill one fourth of your plate at each meal with whole grains. Whole grains include whole-wheat pasta, brown rice, and whole-grain bread. Eat or drink low-fat dairy products, such as skim milk or low-fat yogurt. Fill one fourth of your plate at each meal with low-fat (lean) proteins. Low-fat proteins include fish, chicken without skin, eggs, beans, and tofu. Avoid fatty meat, cured and processed meat, or chicken with skin. Avoid pre-made or processed food. Eat less than 1,500 mg of salt each day. Do not drink alcohol if: Your doctor tells you not to drink. You are pregnant, may be pregnant, or are planning to become pregnant. If you drink alcohol: Limit how much you use to: 0-1 drink a day for women. 0-2 drinks a day for men. Be aware of how much alcohol is in your drink. In the [place] one drink equals one 12 oz bottle of beer (355 mL), one 5 oz glass of wine (148 mL), or one 1 oz glass of hard liquor (  44 mL). Lifestyle  Work with your doctor to stay at a healthy weight or to lose weight. Ask your doctor what the best weight is for you. Get at least 30 minutes of exercise most days of the week. This may include walking, swimming, or biking. Get at least 30 minutes of exercise that strengthens your muscles (resistance exercise) at least 3 days a week. This may include lifting weights or doing Pilates. Do not use any products that contain nicotine or tobacco, such as cigarettes, e-cigarettes, and chewing tobacco. If you need help quitting, ask your doctor. Check  your blood pressure at home as told by your doctor. Keep all follow-up visits as told by your doctor. This is important. Medicines Take over-the-counter and prescription medicines only as told by your doctor. Follow directions carefully. Do not skip doses of blood pressure medicine. The medicine does not work as well if you skip doses. Skipping doses also puts you at risk for problems. Ask your doctor about side effects or reactions to medicines that you should watch for. Contact a doctor if you: Think you are having a reaction to the medicine you are taking. Have headaches that keep coming back (recurring). Feel dizzy. Have swelling in your ankles. Have trouble with your vision. Get help right away if you: Get a very bad headache. Start to feel mixed up (confused). Feel weak or numb. Feel faint. Have very bad pain in your: Chest. Belly (abdomen). Throw up more than once. Have trouble breathing. Summary Hypertension is another name for high blood pressure. High blood pressure forces your heart to work harder to pump blood. For most people, a normal blood pressure is less than 120/80. Making healthy choices can help lower blood pressure. If your blood pressure does not get lower with healthy choices, you may need to take medicine. This information is not intended to replace advice given to you by your health care provider. Make sure you discuss any questions you have with your health care provider. Document Revised: 09/25/2017 Document Reviewed: 09/25/2017 Elsevier Patient Education  2022 [place].  Diabetes Mellitus and Nutrition, Adult When you have diabetes, or diabetes mellitus, it is very important to have healthy eating habits because your blood sugar (glucose) levels are greatly affected by what you eat and drink. Eating healthy foods in the right amounts, at about the same times every day, can help you: Manage your blood glucose. Lower your risk of heart  disease. Improve your blood pressure. Reach or maintain a healthy weight. What can affect my meal plan? Every person with diabetes is different, and each person has different needs for a meal plan. Your health care provider may recommend that you work with a dietitian to make a meal plan that is best for you. Your meal plan may vary depending on factors such as: The calories you need. The medicines you take. Your weight. Your blood glucose, blood pressure, and cholesterol levels. Your activity level. Other health conditions you have, such as heart or kidney disease. How do carbohydrates affect me? Carbohydrates, also called carbs, affect your blood glucose level more than any other type of food. Eating carbs raises the amount of glucose in your blood. It is important to know how many carbs you can safely have in each meal. This is different for every person. Your dietitian can help you calculate how many carbs you should have at each meal and for each snack. How does alcohol affect me? Alcohol can cause a decrease in blood  glucose (hypoglycemia), especially if you use insulin or take certain diabetes medicines by mouth. Hypoglycemia can be a life-threatening condition. Symptoms of hypoglycemia, such as sleepiness, dizziness, and confusion, are similar to symptoms of having too much alcohol. Do not drink alcohol if: Your health care provider tells you not to drink. You are pregnant, may be pregnant, or are planning to become pregnant. If you drink alcohol: Limit how much you have to: 0-1 drink a day for women. 0-2 drinks a day for men. Know how much alcohol is in your drink. In the [place] one drink equals one 12 oz bottle of beer (355 mL), one 5 oz glass of wine (148 mL), or one 1 oz glass of hard liquor (44 mL). Keep yourself hydrated with water, diet soda, or unsweetened iced tea. Keep in mind that regular soda, juice, and other mixers may contain a lot of sugar and must be counted as  carbs. What are tips for following this plan? Reading food labels Start by checking the serving size on the Nutrition Facts label of packaged foods and drinks. The number of calories and the amount of carbs, fats, and other nutrients listed on the label are based on one serving of the item. Many items contain more than one serving per package. Check the total grams (g) of carbs in one serving. Check the number of grams of saturated fats and trans fats in one serving. Choose foods that have a low amount or none of these fats. Check the number of milligrams (mg) of salt (sodium) in one serving. Most people should limit total sodium intake to less than 2,300 mg per day. Always check the nutrition information of foods labeled as "low-fat" or "nonfat." These foods may be higher in added sugar or refined carbs and should be avoided. Talk to your dietitian to identify your daily goals for nutrients listed on the label. Shopping Avoid buying canned, pre-made, or processed foods. These foods tend to be high in fat, sodium, and added sugar. Shop around the outside edge of the grocery store. This is where you will most often find fresh fruits and vegetables, bulk grains, fresh meats, and fresh dairy products. Cooking Use low-heat cooking methods, such as baking, instead of high-heat cooking methods, such as deep frying. Cook using healthy oils, such as olive, canola, or sunflower oil. Avoid cooking with butter, cream, or high-fat meats. Meal planning Eat meals and snacks regularly, preferably at the same times every day. Avoid going long periods of time without eating. Eat foods that are high in fiber, such as fresh fruits, vegetables, beans, and whole grains. Eat 4-6 oz (112-168 g) of lean protein each day, such as lean meat, chicken, fish, eggs, or tofu. One ounce (oz) (28 g) of lean protein is equal to: 1 oz (28 g) of meat, chicken, or fish. 1 egg.  cup (62 g) of tofu. Eat some foods each day that  contain healthy fats, such as avocado, nuts, seeds, and fish. What foods should I eat? Fruits Berries. Apples. Oranges. Peaches. Apricots. Plums. Grapes. Mangoes. Papayas. Pomegranates. Kiwi. Cherries. Vegetables Leafy greens, including lettuce, spinach, kale, chard, collard greens, mustard greens, and cabbage. Beets. Cauliflower. Broccoli. Carrots. Green beans. Tomatoes. Peppers. Onions. Cucumbers. Brussels sprouts. Grains Whole grains, such as whole-wheat or whole-grain bread, crackers, tortillas, cereal, and pasta. Unsweetened oatmeal. Quinoa. Brown or wild rice. Meats and other proteins Seafood. Poultry without skin. Lean cuts of poultry and beef. Tofu. Nuts. Seeds. Dairy Low-fat or fat-free dairy products such  as milk, yogurt, and cheese. The items listed above may not be a complete list of foods and beverages you can eat and drink. Contact a dietitian for more information. What foods should I avoid? Fruits Fruits canned with syrup. Vegetables Canned vegetables. Frozen vegetables with butter or cream sauce. Grains Refined white flour and flour products such as bread, pasta, snack foods, and cereals. Avoid all processed foods. Meats and other proteins Fatty cuts of meat. Poultry with skin. Breaded or fried meats. Processed meat. Avoid saturated fats. Dairy Full-fat yogurt, cheese, or milk. Beverages Sweetened drinks, such as soda or iced tea. The items listed above may not be a complete list of foods and beverages you should avoid. Contact a dietitian for more information. Questions to ask a health care provider Do I need to meet with a certified diabetes care and education specialist? Do I need to meet with a dietitian? What number can I call if I have questions? When are the best times to check my blood glucose? Where to find more information: American Diabetes Association: diabetes.org Academy of Nutrition and Dietetics: eatright.[employer] of Diabetes and  Digestive and Kidney Diseases: [employer] Association of Diabetes Care & Education Specialists: diabeteseducator.org Summary It is important to have healthy eating habits because your blood sugar (glucose) levels are greatly affected by what you eat and drink. It is important to use alcohol carefully. A healthy meal plan will help you manage your blood glucose and lower your risk of heart disease. Your health care provider may recommend that you work with a dietitian to make a meal plan that is best for you. This information is not intended to replace advice given to you by your health care provider. Make sure you discuss any questions you have with your health care provider. Document Revised: 08/19/2019 Document Reviewed: 08/19/2019 Elsevier Patient Education  [place].

## 2021-01-09 NOTE — Chronic Care Management (AMB) (Signed)
Care Management    RN Visit Note  01/09/2021 Name: Crystal Bryant MRN: [id number] DOB: May 16, 1970  Subjective: Crystal Bryant is a 50 y.o. year old female who is a primary care patient of Shelda Pal, DO. The care management team was consulted for assistance with disease management and care coordination needs.    Engaged with patient by telephone for initial visit in response to provider referral for case management and/or care coordination services.   Consent to Services:   Ms. Martorano was given information about Care Management services today including:  Care Management services includes personalized support from designated clinical staff supervised by her physician, including individualized plan of care and coordination with other care providers 24/7 contact phone numbers for assistance for urgent and routine care needs. The patient may stop case management services at any time by phone call to the office staff.  Patient agreed to services and consent obtained.   Assessment: Review of patient past medical history, allergies, medications, health status, including review of consultants reports, laboratory and other test data, was performed as part of comprehensive evaluation and provision of chronic care management services.   SDOH (Social Determinants of Health) assessments and interventions performed:  SDOH Interventions    Flowsheet Row Most Recent Value  SDOH Interventions   Food Insecurity Interventions Intervention Not Indicated  Transportation Interventions Intervention Not Indicated  Depression Interventions/Treatment  --  [referral to clinical social work]        Care Plan  No Known Allergies  Outpatient Encounter Medications as of 01/09/2021  Medication Sig   amLODipine (NORVASC) 10 MG tablet Take 1 tablet (10 mg total) by mouth daily.   atorvastatin (LIPITOR) 20 MG tablet Take 1 tablet (20 mg total) by mouth daily.   Blood Glucose Monitoring  Suppl (FREESTYLE LITE) DEVI Use As Directed   carvedilol (COREG) 25 MG tablet Take 2 tablets (50 mg total) by mouth 2 (two) times daily with a meal.   gabapentin (NEURONTIN) 300 MG capsule Take 2 capsules (600 mg total) by mouth at bedtime.   insulin lispro (HUMALOG KWIKPEN) 200 UNIT/ML KwikPen Inject 0-15 Units into the skin 4 (four) times daily -  before meals and at bedtime. insulin aspart (novoLOG) injection 0-15 Units 0-15 Units Subcutaneous, 3 times daily with meals CBG < 70: Implement Hypoglycemia Standing Orders and refer to Hypoglycemia Standing Orders sidebar report  CBG 70 - 120: 0 unit CBG 121 - 150: 2 unit  CBG 151 - 200: 4 unit CBG 201 - 250: 6 units CBG 251 - 300: 9 units CBG 301 - 350: 11 units  CBG 351 - 400: 13 units  CBG > 400: 15 units   meclizine (ANTIVERT) 25 MG tablet Take 1 tablet (25 mg total) by mouth 3 (three) times daily as needed for dizziness.   metFORMIN (GLUCOPHAGE) 500 MG tablet Take 1 tablet (500 mg total) by mouth 2 (two) times daily with a meal.   ondansetron (ZOFRAN) 4 MG tablet Take 1 tablet (4 mg total) by mouth daily as needed for nausea or vomiting.   potassium chloride (KLOR-CON) 10 MEQ tablet Take 1 tablet (10 mEq total) by mouth daily. Take While taking Torsemide/Demadex   sacubitril-valsartan (ENTRESTO) 97-103 MG Take 1 tablet by mouth 2 (two) times daily.   torsemide (DEMADEX) 20 MG tablet Take 1 tablet (20 mg total) by mouth daily.   Continuous Blood Gluc Receiver (DEXCOM G5 RECEIVER KIT) DEVI Use daily to check blood sugar.  DXE11.9 (Patient not taking:  Reported on 01/09/2021)   Continuous Blood Gluc Sensor (DEXCOM G6 SENSOR) MISC USE DAILY TO CHECK BLOOD SUGAR (Patient not taking: Reported on 01/09/2021)   glucose blood (FREESTYLE LITE) test strip Use as instructed   hydrOXYzine (ATARAX/VISTARIL) 10 MG tablet Take 1 tablet (10 mg total) by mouth 3 (three) times daily as needed for itching or anxiety.   insulin degludec (TRESIBA FLEXTOUCH) 200 UNIT/ML  FlexTouch Pen Inject 16 Units into the skin at bedtime. (Patient not taking: Reported on 01/09/2021)   Insulin Pen Needle 31G X 6 MM MISC USE AS DIRECTED 4 TIMES DAILY   Lancets (FREESTYLE) lancets Use as instructed   prochlorperazine (COMPAZINE) 25 MG suppository Place 1 suppository (25 mg total) rectally 2 (two) times daily. (Patient not taking: Reported on 01/09/2021)   [DISCONTINUED] glyBURIDE (DIABETA) 5 MG tablet Take 1 tablet (5 mg total) by mouth daily with breakfast.   No facility-administered encounter medications on file as of 01/09/2021.    Patient Active Problem List   Diagnosis Date Noted   Hypertensive crisis 12/19/2020   Hypertensive urgency 12/18/2020   Cystitis 11/10/2020   GAD (generalized anxiety disorder) 07/22/2019   Pharyngoesophageal dysphagia 07/22/2019   Pacemaker lead malfunction 03/26/2019   Biventricular ICD (implantable cardioverter-defibrillator) in place 03/12/2019   LBBB (left bundle branch block) 08/12/2018   Type 2 diabetes mellitus with hyperglycemia, with long-term current use of insulin ([place]) 07/17/2018   COVID-19 virus infection 85/88/5027   Chronic systolic heart failure ([place]) 07/09/2018   Hypertensive disorder 05/13/2017   Diabetes mellitus ([place]) 05/13/2017   Status post lobectomy of lung 03/02/2017   Essential hypertension, benign 01/05/2017   Type 2 diabetes mellitus with hyperglycemia, without long-term current use of insulin ([place]) 01/05/2017   Postoperative visit 10/25/2015   Malignant neoplasm of upper lobe of right lung ([place]) 09/21/2015    Conditions to be addressed/monitored: CHF, HTN, and DMII  Care Plan : RN Care Management Plan of Care  Updates made by Luretha Rued, RN since 01/09/2021 12:00 AM     Problem: No Plan of Care Established for Management of Chronic Disease   Priority: High     Long-Range Goal: Development of Plan of Care for Disease Management and Care Coodination needs.   Start Date: 01/09/2021  Expected  End Date: 04/09/2021  Priority: High  Note:   Current Barriers: Recent hospitalization for hypertensive urgency. She reports she does not routinely check blood pressure and last checked a couple days ago was 185/70-80. She reports she has a visit scheduled with primary care provider for 01/11/21. She also reports some vertigo at times, treated with meclizine. Ms. Coddington was teary when discussing expressing fear of the unknown.  She does not check blood sugar as prescribed. She reports she checks blood sugar daily. This morning blood sugar was 211, but adds she ate late last night about 11:00 pm 6" sub sandwich/chips/coke zero. She also re PHQ2 -5, [id number]- Patient expresses loss of her Grandmother last month and her daughter, who lives in [place] apartment just caught on fire last week. Stress of chronic illness. Knowledge Deficits related to plan of care for management of CHF, HTN, and DMII  Care Coordination needs related to Medication procurement and Mental Health Concerns   Chronic Disease Management support and education needs related to CHF, HTN, and DMII  RNCM Clinical Goal(s):  Patient will verbalize understanding of plan for management of CHF, HTN, and DMII as evidenced by self reports and/or chart notation demonstrate improved adherence to prescribed  treatment plan for CHF, HTN, and DMII as evidenced by attending provider visits as scheduled, taking medications as prescribed, monitoring and recording blood pressure and blood glucose as recommended, adherence to prescribed diet work with pharmacist to address Medication procurement related to CHF, HTN, and DMII as evidenced by review of EMR and patient or pharmacist report    work with Education officer, museum to address [place] Concerns  related to the management of CHF, HTN, and DMII as evidenced by review of EMR and patient or Education officer, museum report     through collaboration with Consulting civil engineer, provider, and care team.    Interventions: collaboration with primary care provider regarding development and update of comprehensive plan of care as evidenced by provider attestation and co-signature Inter-disciplinary care team collaboration (see longitudinal plan of care) Evaluation of current treatment plan related to  self management and patient's adherence to plan as established by provider  Diabetes Interventions:  (Status:  New goal.) Long Term Goal Assessed patient's understanding of A1c goal: <7% Reviewed medications with patient and discussed importance of medication adherence Counseled on importance of regular laboratory monitoring as prescribed Provided patient with written educational materials related to hypo and hyperglycemia and importance of correct treatment Referral made to pharmacy team for assistance with medication review Referral made to social work team for assistance with mental health needs Screening for signs and symptoms of depression related to chronic disease state  Lab Results  Component Value Date   HGBA1C 10.5 (H) 12/19/2020  Hypertension Interventions:  (Status:  New goal.) Long Term Goal Last practice recorded BP readings:  BP Readings from Last 3 Encounters:  12/20/20 (!) 157/99  11/10/20 130/80  02/16/20 (!) 220/120  Most recent eGFR/CrCl: No results found for: EGFR  No components found for: CRCL  Evaluation of current treatment plan related to hypertension self management and patient's adherence to plan as established by provider Provided education to patient re: stroke prevention, s/s of heart attack and stroke Reviewed medications with patient and discussed importance of compliance Discussed plans with patient for ongoing care management follow up and provided patient with direct contact information for care management team Advised patient, providing education and rationale, to monitor blood pressure daily and record, calling PCP for findings outside established  parameters Advised patient to discuss blood pressure parameters with provider Screening for signs and symptoms of depression related to chronic disease state  Discussed complications of poorly controlled blood pressure such as heart disease, stroke, kidney problems a     Discussed best way to get most accurate consistent blood pressure readings.  Heart Failure Interventions:  (Status:  New goal.) Long Term Goal Assessed need for readable accurate scales in home Discussed importance of daily weight and advised patient to weigh and record daily Discussed the importance of keeping all appointments with provider  Discussed signs/symptoms of heart failure exacerbation  Patient Goals/Self-Care Activities: Check, record blood pressure daily and notify provider if outside recommended parameters. Take readings with your to provider visits. Ask your doctor what is your Target Blood Pressure Check Blood Sugar and record as prescribed by provider and if you feel your blood sugar is too low or too high. Take readings with you to provider visits. Take medications as prescribed   Call provider office for new concerns or questions  Work with the social worker to address care coordination needs and will continue to work with the clinical team to address health care and disease management related needs Contact your endocrinologist to  schedule an appointment. manage portion size Do not drink sugared drinks Weigh and record weights daily     Plan: Telephone follow up appointment with care management team member scheduled for:  01/31/21 The patient has been provided with contact information for the care management team and has been advised to call with any health related questions or concerns.   Thea Silversmith, RN, MSN, BSN, CCM Care Management Coordinator [facility] [phone]

## 2021-01-10 ENCOUNTER — Telehealth: Payer: Self-pay | Admitting: *Deleted

## 2021-01-10 NOTE — Chronic Care Management (AMB) (Signed)
°  Chronic Care Management   Note  01/10/2021 Name: Crystal Bryant MRN: [id number] DOB: Jul 03, 1970  Crystal Bryant is a 50 y.o. year old female who is a primary care patient of Crystal Bryant, Crystal Oyster, DO. Crystal Bryant is currently enrolled in care management services. An additional referral for Pharm D was placed.   Follow up plan: Telephone appointment with care management team member scheduled for: 01/18/2021  Julian Hy, [place] Management  Direct Dial: [phone]

## 2021-01-11 ENCOUNTER — Encounter: Payer: Self-pay | Admitting: Family Medicine

## 2021-01-11 ENCOUNTER — Telehealth (INDEPENDENT_AMBULATORY_CARE_PROVIDER_SITE_OTHER): Payer: 59 | Admitting: Family Medicine

## 2021-01-11 DIAGNOSIS — R42 Dizziness and giddiness: Secondary | ICD-10-CM | POA: Diagnosis not present

## 2021-01-11 DIAGNOSIS — F411 Generalized anxiety disorder: Secondary | ICD-10-CM

## 2021-01-11 DIAGNOSIS — I10 Essential (primary) hypertension: Secondary | ICD-10-CM

## 2021-01-11 MED ORDER — CITALOPRAM HYDROBROMIDE 20 MG PO TABS: 20.0000 mg | ORAL_TABLET | Freq: Every day | ORAL | 3 refills | Status: AC

## 2021-01-11 MED ORDER — AMLODIPINE BESYLATE 10 MG PO TABS: 10.0000 mg | ORAL_TABLET | Freq: Every day | ORAL | 5 refills | Status: AC

## 2021-01-11 NOTE — Progress Notes (Addendum)
Chief Complaint  Patient presents with   FMLA    Subjective Crystal Bryant is a 50 y.o. female who presents for hypertension follow up. Due to COVID-19 pandemic, we are interacting via web portal for an electronic face-to-face visit. I verified patient's ID using 2 identifiers. Patient agreed to proceed with visit via this method. Patient is at home, I am at office. Patient and I are present for visit.   She does monitor home blood pressures. Blood pressures ranging from 160-180's/50-70's on average. She is compliant with medications- Entresto, Coreg 50 mg bid. Has not started norvasc.  Patient has these side effects of medication: none She is sometimes adhering to a healthy diet overall. Current exercise: none No CP or SOB.   Anxiety-since admission to the hospital, the patient has been having anxiety about vertigo.  She is wondering whether it is related to her blood pressure or something else.  She takes the meclizine but is unsure if it helps.  She had 1 episode of vertigo since discharge on 11/22.  She is not currently following with a counselor or psychologist.  She is on hydroxyzine as needed but nothing daily.  She is interested in starting a medicine daily.  No self-medication or homicidal/suicidal ideation.   Past Medical History:  Diagnosis Date   Anxiety    CHF (congestive heart failure) ([place])    Diabetes mellitus without complication ([place])    Hypertension    Lung cancer, upper lobe ([place]) 09/2015   Non-small cell in right upper lobe treated with RULectomy by VATS at [facility]    Exam No conversational dyspnea Age appropriate judgment and insight Nml affect and mood  Essential hypertension - Plan: amLODipine (NORVASC) 10 MG tablet  GAD (generalized anxiety disorder) - Plan: citalopram (CELEXA) 20 MG tablet  Vertigo - Plan: Ambulatory referral to Physical Therapy  Chronic, uncontrolled.  Continue Entresto and Coreg 50 mg twice daily.  We will add Norvasc 10 mg daily.   Monitor blood pressure at home.  Send a message in 2 weeks and we will see if we need to add a thiazide diuretic versus spironolactone.  Counseled on diet and exercise. Chronic, uncontrolled.  Start Celexa 10 mg daily for 2 weeks and then increase to 20 mg daily.  Exercise can help.  Counseling information provided over MyChart. Epley maneuver sent over MyChart.  Refer to vestibular rehab.  Difficult to evaluate but hopefully this is caused by blood pressure which we would be more easily treated. F/u in 1 month. The patient voiced understanding and agreement to the plan.  I spent 40 minutes with the patient discussing the above in addition to reviewing her chart and filling out forms on the same day of the visit.  [place], DO 01/11/21  9:28 AM

## 2021-01-18 ENCOUNTER — Telehealth: Payer: Self-pay | Admitting: *Deleted

## 2021-01-18 ENCOUNTER — Ambulatory Visit: Payer: 59 | Admitting: Pharmacist

## 2021-01-18 ENCOUNTER — Telehealth: Payer: Self-pay | Admitting: Pharmacist

## 2021-01-18 ENCOUNTER — Other Ambulatory Visit (HOSPITAL_BASED_OUTPATIENT_CLINIC_OR_DEPARTMENT_OTHER): Payer: Self-pay

## 2021-01-18 DIAGNOSIS — I5022 Chronic systolic (congestive) heart failure: Secondary | ICD-10-CM

## 2021-01-18 DIAGNOSIS — F411 Generalized anxiety disorder: Secondary | ICD-10-CM

## 2021-01-18 DIAGNOSIS — Z79899 Other long term (current) drug therapy: Secondary | ICD-10-CM

## 2021-01-18 DIAGNOSIS — E1165 Type 2 diabetes mellitus with hyperglycemia: Secondary | ICD-10-CM

## 2021-01-18 DIAGNOSIS — I10 Essential (primary) hypertension: Secondary | ICD-10-CM

## 2021-01-18 MED FILL — Continuous Glucose System Sensor: 30 days supply | Qty: 3 | Fill #0 | Status: CN

## 2021-01-18 NOTE — Chronic Care Management (AMB) (Signed)
Chronic Care Management Pharmacy Assistant   Name: Crystal Bryant  MRN: [id number] DOB: 1970-09-09  Crystal Bryant is an 50 y.o. year old female who presents for his initial CCM visit with the clinical pharmacist.   Recent office visits:  01/11/21-Crystal Nolene Ebbs, DO (PCP, Video visit) Seen for FMLA. Ambulatory referral to Physical Therapy. Start on  Norvasc 10 mg daily. Follow up in 1 month. 11/10/20-Crystal Hudnell, NP. Seen for a general follow up visit. Start on nitrofurantoin (macrocrystal-monohydrate) (MACROBID) capsule 100 mg. Return if symptoms worsen or fail to improve.  Recent consult visits:  12/05/20-(Cardiology) Crystal Bryant. Crystal Bryant. Notes not available.  Hospital visits:  Medication Reconciliation was completed by comparing discharge summary, patients EMR and Pharmacy list, and upon discussion with patient.  Admitted to the hospital on 12/18/20 due to Chest pain. Discharge date was 12/20/20. Discharged from [facility]?Medications Started at [facility] Discharge:?? -started None noted  Medication Changes at Hospital Discharge: -Changed None noted  Medications Discontinued at Hospital Discharge: -Stopped None noted  Medications that remain the same after Hospital Discharge:??  -All other medications will remain the same.    Medications: Outpatient Encounter Medications as of 01/18/2021  Medication Sig   amLODipine (NORVASC) 10 MG tablet Take 1 tablet (10 mg total) by mouth daily.   atorvastatin (LIPITOR) 20 MG tablet Take 1 tablet (20 mg total) by mouth daily.   Blood Glucose Monitoring Suppl (FREESTYLE LITE) DEVI Use As Directed   carvedilol (COREG) 25 MG tablet Take 2 tablets (50 mg total) by mouth 2 (two) times daily with a meal.   citalopram (CELEXA) 20 MG tablet Take 1 tablet (20 mg total) by mouth daily.   Continuous Blood Gluc Receiver (DEXCOM G5 RECEIVER KIT) DEVI Use daily to check blood sugar.  DXE11.9 (Patient not taking:  Reported on 01/09/2021)   Continuous Blood Gluc Sensor (DEXCOM G6 SENSOR) MISC USE DAILY TO CHECK BLOOD SUGAR (Patient not taking: Reported on 01/09/2021)   gabapentin (NEURONTIN) 300 MG capsule Take 2 capsules (600 mg total) by mouth at bedtime.   glucose blood (FREESTYLE LITE) test strip Use as instructed   hydrOXYzine (ATARAX/VISTARIL) 10 MG tablet Take 1 tablet (10 mg total) by mouth 3 (three) times daily as needed for itching or anxiety.   insulin degludec (TRESIBA FLEXTOUCH) 200 UNIT/ML FlexTouch Pen Inject 16 Units into the skin at bedtime. (Patient not taking: Reported on 01/09/2021)   insulin lispro (HUMALOG KWIKPEN) 200 UNIT/ML KwikPen Inject 0-15 Units into the skin 4 (four) times daily -  before meals and at bedtime. insulin aspart (novoLOG) injection 0-15 Units 0-15 Units Subcutaneous, 3 times daily with meals CBG < 70: Implement Hypoglycemia Standing Orders and refer to Hypoglycemia Standing Orders sidebar report  CBG 70 - 120: 0 unit CBG 121 - 150: 2 unit  CBG 151 - 200: 4 unit CBG 201 - 250: 6 units CBG 251 - 300: 9 units CBG 301 - 350: 11 units  CBG 351 - 400: 13 units  CBG > 400: 15 units   Insulin Pen Needle 31G X 6 MM MISC USE AS DIRECTED 4 TIMES DAILY   Lancets (FREESTYLE) lancets Use as instructed   meclizine (ANTIVERT) 25 MG tablet Take 1 tablet (25 mg total) by mouth 3 (three) times daily as needed for dizziness.   metFORMIN (GLUCOPHAGE) 500 MG tablet Take 1 tablet (500 mg total) by mouth 2 (two) times daily with a meal.   ondansetron (ZOFRAN) 4 MG tablet Take  1 tablet (4 mg total) by mouth daily as needed for nausea or vomiting.   potassium chloride (KLOR-CON) 10 MEQ tablet Take 1 tablet (10 mEq total) by mouth daily. Take While taking Torsemide/Demadex   sacubitril-valsartan (ENTRESTO) 97-103 MG Take 1 tablet by mouth 2 (two) times daily.   torsemide (DEMADEX) 20 MG tablet Take 1 tablet (20 mg total) by mouth daily.   [DISCONTINUED] glyBURIDE (DIABETA) 5 MG tablet Take 1  tablet (5 mg total) by mouth daily with breakfast.   No facility-administered encounter medications on file as of 01/18/2021.   AmLODipine (NORVASC) 10 MG tablet Last filled:01/11/21 30 DS Atorvastatin (LIPITOR) 20 MG tablet Last filled:12/20/20 30 DS Carvedilol (COREG) 25 MG tablet Last filled:12/20/20 30 DS Citalopram (CELEXA) 20 MG tablet Last filled:01/11/21 30 DS Gabapentin (NEURONTIN) 300 MG capsule Last filled:06/16/20 30 DS GydrOXYzine (ATARAX/VISTARIL) 10 MG tablet Last filled:None noted Insulin degludec (TRESIBA FLEXTOUCH) 200 UNIT/ML FlexTouch Pen Last filled:12/20/20 62 DS Insulin lispro (HUMALOG KWIKPEN) 200 UNIT/ML KwikPen Insulin Pen Needle 31G X 6 MM MISC Last filled:12/20/20 20 DS Meclizine (ANTIVERT) 25 MG tablet Last filled:12/20/20 10 DS MetFORMIN (GLUCOPHAGE) 500 MG tablet Last filled:12/20/20 30 DS Ondansetron (ZOFRAN) 4 MG tablet Last filled:12/20/20 30 DS Potassium chloride (KLOR-CON) 10 MEQ tablet Last filled:12/20/20 30 DS Sacubitril-valsartan (ENTRESTO) 97-103 MG Last filled:12/20/20 30 DS Torsemide (DEMADEX) 20 MG tablet Last filled:12/20/20 30 DS   Care Gaps: COVID-19 Vaccine:Never done Pneumococcal Vaccine 65-54 Years JTT:SVXBL done Hepatitis C Screening:Never done TETANUS/TDAP:Never done COLONOSCOPY (Pts 45-64yr Insurance coverage will need to be confirmed):Never done OPHTHALMOLOGY EXAM :Last completed: Sep 11, 2016 INFLUENZA VACCINE:Last completed: Nov 12, 2019  Star Rating Drugs: Atorvastatin (LIPITOR) 20 MG tablet Last filled:12/20/20 30 DS Sacubitril-valsartan (ENTRESTO) 97-103 MG Last filled:12/20/20 30 DS  MCorrie Mckusick [place]boy[place]

## 2021-01-18 NOTE — Chronic Care Management (AMB) (Signed)
°  Care Management   Note  01/18/2021 Name: Crystal Bryant MRN: [id number] DOB: 1970/10/26  Crystal Bryant is a 50 y.o. year old female who is a primary care patient of Shelda Pal, DO. I reached out to [facility] by phone today in response to a referral sent by Ms. Hardie Shackleton primary care provider.   Ms. Fitzgibbon was given information about care management services today including:  Care management services include personalized support from designated clinical staff supervised by her physician, including individualized plan of care and coordination with other care providers 24/7 contact phone numbers for assistance for urgent and routine care needs. The patient may stop care management services at any time by phone call to the office staff.  Patient agreed to services and verbal consent obtained.   Follow up plan: Telephone appointment with care management team member scheduled for: 02/20/2021  Julian Hy, [place] Management  Direct Dial: [phone]

## 2021-01-24 NOTE — Chronic Care Management (AMB) (Signed)
Care Management   Pharmacy Note  01/24/2021 Name: Crystal Bryant MRN: [id number] DOB: 1970-10-23  Subjective: Crystal Bryant is a 50 y.o. year old female who is a primary care patient of Shelda Pal, DO. The Care Management team was consulted for assistance with care management and care coordination needs.    Engaged with patient by telephone for initial visit in response to provider referral for pharmacy case management and/or care coordination services.   The patient was given information about Care Management services today including:  Care Management services includes personalized support from designated clinical staff supervised by the patient's primary care provider, including individualized plan of care and coordination with other care providers. 24/7 contact phone numbers for assistance for urgent and routine care needs. The patient may stop case management services at any time by phone call to the office staff.  Patient agreed to services and consent obtained.  Recent Office Visits:  01/11/2021 - [facility] (Dr Nani Ravens) Video Visit for HTN and anxiety. Start citalopram 2m - take 0.5 tablet daily for 2 weeks, then increase to 1 tablet daily. Started amlodipine 141mdaily  Recent Consults: None  Recent [facility]:  [facility] BP on admission was 260/133; Given labetalol 51m751mor 2 doses. Also started [place] Novolog sliding scale.   Assessment:  Review of patient status, including review of consultants reports, laboratory and other test data, was performed as part of comprehensive evaluation and provision of chronic care management services.   SDOH (Social Determinants of Health) assessments and interventions performed:  SDOH Interventions    Flowsheet Row Most Recent Value  SDOH Interventions   Financial Strain Interventions Other (Comment)  [provided sample /  checking coverage for DexCom at outpatient pharmacy]        Objective:  Lab Results  Component Value Date   CREATININE 1.15 (H) 12/20/2020   CREATININE 0.89 12/19/2020   CREATININE 0.89 12/18/2020    Lab Results  Component Value Date   HGBA1C 10.5 (H) 12/19/2020       Component Value Date/Time   CHOL 278 (H) 12/15/2016 1201   TRIG 171 (H) 07/09/2018 1015   HDL 45 12/15/2016 1201   CHOLHDL 6.2 (H) 12/15/2016 1201   LDLCALC 197 (H) 12/15/2016 1201    Other: (TSH, CBC, Vit D, etc.)  Clinical ASCVD: Yes  The ASCVD Risk score (Arnett DK, et al., 2019) failed to calculate for the following reasons:   Cannot find a previous HDL lab   Cannot find a previous total cholesterol lab    Other: (CHADS2VASc if Afib, PHQ9 if depression, MMRC or CAT for COPD, ACT, DEXA)  BP Readings from Last 3 Encounters:  12/20/20 (!) 157/99  11/10/20 130/80  02/16/20 (!) 220/120    Care Plan  Allergies  Allergen Reactions   Sertraline Other (See Comments)    headache    Medications Reviewed Today     Reviewed by EckCherre RobinsPH-CPP (Pharmacist) on 01/18/21 at 1506  Med List Status: <None>   Medication Order Taking? Sig Documenting Provider Last Dose Status Informant  amLODipine (NORVASC) 10 MG tablet [id number] Take 1 tablet (10 mg total) by mouth daily.  Patient not taking: Reported on 01/18/2021   WenShelda PalO Not Taking Active   atorvastatin (LIPITOR) 20 MG tablet [id number] Take 1 tablet (20 mg total) by mouth daily.  Patient not taking: Reported on 01/18/2021   EmoRoxan HockeyD Not  Taking Active   carvedilol (COREG) 25 MG tablet [id number] Yes Take 2 tablets (50 mg total) by mouth 2 (two) times daily with a meal. Roxan Hockey, MD Taking Active            Med Note Antony Contras, Adesuwa Osgood B   Wed Jan 18, 2021  2:38 PM) Only taking 1 tablet twice a day  citalopram (CELEXA) 20 MG tablet [id number] No Take 1 tablet (20 mg total) by mouth daily.  Patient not  taking: Reported on 01/18/2021   Shelda Pal, DO Not Taking Active   Continuous Blood Gluc Receiver (DEXCOM G5 RECEIVER KIT) DEVI [id number]  Use daily to check blood sugar.  DXE11.9  Patient not taking: Reported on 01/09/2021   Shelda Pal, DO  Active Self  Continuous Blood Gluc Sensor ([place]) MISC [id number]  USE DAILY TO CHECK BLOOD SUGAR  Patient not taking: Reported on 01/09/2021   Shelda Pal, DO  Active Self  gabapentin (NEURONTIN) 300 MG capsule [id number]  Take 2 capsules (600 mg total) by mouth at bedtime.  Patient taking differently: Take 300 mg by mouth at bedtime.   Shelda Pal, DO  Active Self  glucose blood (FREESTYLE LITE) test strip [id number]  Use as instructed Maryruth Hancock, MD  Active Self    Discontinued 12/31/19 0727   insulin degludec (TRESIBA FLEXTOUCH) 200 UNIT/ML FlexTouch Pen [id number] No Inject 16 Units into the skin at bedtime.  Patient not taking: Reported on 01/09/2021   Roxan Hockey, MD Not Taking Active   insulin lispro (HUMALOG KWIKPEN) 200 UNIT/ML KwikPen [id number] Yes Inject 0-15 Units into the skin 4 (four) times daily -  before meals and at bedtime. insulin aspart (novoLOG) injection 0-15 Units 0-15 Units Subcutaneous, 3 times daily with meals CBG < 70: Implement Hypoglycemia Standing Orders and refer to Hypoglycemia Standing Orders sidebar report  CBG 70 - 120: 0 unit CBG 121 - 150: 2 unit  CBG 151 - 200: 4 unit CBG 201 - 250: 6 units CBG 251 - 300: 9 units CBG 301 - 350: 11 units  CBG 351 - 400: 13 units  CBG > 400: 15 units Emokpae, Courage, MD Taking Active   Insulin Pen Needle 31G X 6 MM MISC [id number] Yes USE AS DIRECTED 4 TIMES DAILY Corum, Rex Kras, MD Taking Active Self  Lancets (FREESTYLE) lancets [id number] No Use as instructed  Patient not taking: Reported on 01/18/2021   Maryruth Hancock, MD Not Taking Active Self  meclizine (ANTIVERT) 25 MG tablet [id number] Yes Take 1 tablet (25 mg total) by  mouth 3 (three) times daily as needed for dizziness. Roxan Hockey, MD Taking Active   metFORMIN (GLUCOPHAGE) 500 MG tablet [id number] No Take 1 tablet (500 mg total) by mouth 2 (two) times daily with a meal.  Patient not taking: Reported on 01/18/2021   Roxan Hockey, MD Not Taking Active   ondansetron (ZOFRAN) 4 MG tablet [id number] Yes Take 1 tablet (4 mg total) by mouth daily as needed for nausea or vomiting. Roxan Hockey, MD Taking Active   Potassium Chloride 10 % SOLN [id number] Yes Take 10 mEq by mouth daily. [provider] Taking Active   sacubitril-valsartan (ENTRESTO) 97-103 MG [id number] Yes Take 1 tablet by mouth 2 (two) times daily. Roxan Hockey, MD Taking Active   torsemide (DEMADEX) 20 MG tablet [id number] Yes Take 1 tablet (20 mg total) by mouth daily. Roxan Hockey, MD Taking Active  Patient Active Problem List   Diagnosis Date Noted   Hypertensive crisis 12/19/2020   Hypertensive urgency 12/18/2020   Cystitis 11/10/2020   GAD (generalized anxiety disorder) 07/22/2019   Pharyngoesophageal dysphagia 07/22/2019   Pacemaker lead malfunction 03/26/2019   Biventricular ICD (implantable cardioverter-defibrillator) in place 03/12/2019   LBBB (left bundle branch block) 08/12/2018   Type 2 diabetes mellitus with hyperglycemia, with long-term current use of insulin ([place]) 07/17/2018   COVID-19 virus infection 02/72/5366   Chronic systolic heart failure ([place]) 07/09/2018   Essential hypertension 05/13/2017   Diabetes mellitus ([place]) 05/13/2017   Status post lobectomy of lung 03/02/2017   Essential hypertension, benign 01/05/2017   Type 2 diabetes mellitus with hyperglycemia, without long-term current use of insulin ([place]) 01/05/2017   Postoperative visit 10/25/2015   Malignant neoplasm of upper lobe of right lung ([place]) 09/21/2015    Conditions to be addressed/monitored: CHF, HTN, HLD, DMII, and Anxiety  Care Plan : General Pharmacy (Adult)   Updates made by Cherre Robins, RPH-CPP since 01/24/2021 12:00 AM     Problem: HTN; HDL; CHF; type 2 DM; general anxiety disorder   Priority: High  Onset Date: 01/18/2021     Long-Range Goal: Provide education, support and care coordination for medication therapy and chronic conditions   Start Date: 01/18/2021  Expected End Date: 01/17/2022  Priority: High  Note:   Current Barriers:  Unable to independently afford treatment regimen Unable to independently monitor therapeutic efficacy Unable to achieve control of diabetes, hypertension and anxiety  Unable to self administer medications as prescribed Does not adhere to prescribed medication regimen Does not maintain contact with provider office  Pharmacist Clinical Goal(s):  Over the next 90 days, patient will verbalize ability to afford treatment regimen achieve adherence to monitoring guidelines and medication adherence to achieve therapeutic efficacy achieve control of diabetes, hypertension and anxiety as evidenced by A1c < 8.0; blood pressure <140/90 and decrease in anxiety symptoms adhere to prescribed medication regimen as evidenced by refill history and improved goal contact provider office for questions/concerns as evidenced notation of same in electronic health record through collaboration with PharmD and provider.   Interventions: 1:1 collaboration with Shelda Pal, DO regarding development and update of comprehensive plan of care as evidenced by provider attestation and co-signature Inter-disciplinary care team collaboration (see longitudinal plan of care) Comprehensive medication review performed; medication list updated in electronic medical record  Diabetes: Uncontrolled; A1c goal < 8.0% to start Current treatment: Metformin 536m twice a day with food Tresiba U200 insulin inject 16 units once a day Humalog Kwik pen U200 insulin - inject 0 to 15 units 3 times a day before meals and at bedtime based on  sliding scale Blood glucose 121 - 150 - 2 units Blood glucose 151-200 - 4 units  Blood glucose 201 - 250 - 6 units Blood glucose 251-300 - 9 units Blood glucose 301 - 350 - 11 units Blood glucose 351 - 400 - 13 units Blood glucose >400 - 15 units Current glucose readings: patient is only checking blood glucose once a day in morning.  Reports blood glucose usually 200's  Also is only injecting Humalog in morning Is not taking [place] metformin. States she is not familiar with TTyler Aasand was afraid to take it.  Not taking metformin due to GI side effects.  Interventions:  Provided education about long versus (Tyler Aas short acting (Humalog) insulin. Recommended she start taking Tresiba 16 units daily Recommended retry metformin at 5068mdaily with largest meal  of the day. Increase after 1 week to twice a day if tolerated.  Provided information for discount card patient can get from [employer] site to [place] and [employer] - max out of pocket cost of $25. Provided sample of DexCom 6 for her to pick up in office to use. Need to try to get a better idea about her blood glucose control / variability. Called [facility] out patient pharmacy to check cost and 30 days would be $64.33 (not affordable per patient) Researched further and it looks like if patient enrolls in Diabetes Management Program with [facility] [phone]) she might be able to get Continuous Glucose Monitor / Dex Com through either [employer] or [employer] at not cost. Information provided to patient.  Encouraged her to make appointment with endocrinologist.   Hypertension/CHF: Uncontrolled blood pressure; goal <140/90 Current treatment: Amlodipine 51m daily (has not started)  Carvedilol 248m- take 2 tablets = 504mwice a day (patient reports she is only taking 40m53mice a day) Entresto 97/103mg22me 1 tablet twice a day Torsemide 20mg 40my  Potassium chloride liquid 10 mEq by mouth  daily  Current home readings: 165/70; 170/80 Patient endorses that she is able to afford all medications. She uses a discount coupon for Entres[employer]en West Financialrventions:  Encouraged patient to take medications as prescribe.  Recommended she start amlodipine since SBP is elevated. Continue to check blood pressure daily  Hyperlipidemia: Uncontrolled Current treatment: atorvastatin 20mg d62m (patient has not started yet)  Interventions:  Discussed importance of statin therapy in prevention of CVD.  Patient instructed to start atrovastatin 20mg da32m General anxiety disorder:  Uncontrolled; goal- decrease number of days with anxiety symptoms Patient has a lot of family related stress recently; Grandmother died and [place].  [place]: Cital[place]ram 20mg dai23mhas not started yet - Dr WendelingVonita Mossed her to take 0.5 tablet daily for 2 weeks, then increase to 1 tablet daily) Has tried the following in the past: escitalopram, venlafaxine and sertraline - stopped due to headaches Not currently connected with a mental health provider but a LCSW referral has been sent.  Interventions:  Recommended she start citalopram as directed by Dr WendelingVonita Mossny suspected side effects to office.  Continue with plan to meet with LCSW.  Patient Goals/Self-Care Activities Over the next 90 days, patient will:  take medications as prescribed, focus on medication adherence by taking medications as prescribed, check glucose 3 to 4 times a day, document, and provide at future appointments, check blood pressure once daily, document, and provide at future appointments, and collaborate with provider on medication access solutions  Follow Up Plan: Telephone follow up appointment with care management team member scheduled for:  Nurse care manager 01/31/2021 and  clinical pharmacist 02/08/2020        Medication Assistance:   Patient has insurance through employer but has to meet deductible. She also has to use preferred pharmacy for lower copays and she reports using employee pharmacy is not convenient for her. There is a diabetes monitoring program that she can join that would could also potentially lower her costs but she reports this also is not convenient for her.   Follow Up:  Patient agrees to Care Plan and Follow-up.  Plan: Telephone follow up appointment with care management team member scheduled for:  01/31/2021  with Care management nurse and 02/07/2021 with clinical pharmacist.   Cherre Robins, PharmD Clinical Pharmacist [facility]

## 2021-01-24 NOTE — Patient Instructions (Addendum)
Ms. Pant It was a pleasure speaking with you today.  I have attached a summary of our visit today and information about your health goals.   Our next appointment is by telephone on February 07, 2021 at 12:45pm  Please call the care guide team at [phone] if you need to cancel or reschedule your appointment.   If you have any questions or concerns, please feel free to contact me either at the phone number below or with a MyChart message.   Keep up the good work!  Cherre Robins, PharmD Clinical Pharmacist [place] [phone] (direct line)  [phone] (main office number)   Diabetes: Uncontrolled; A1c goal < 8.0% to start Lab Results  Component Value Date   HGBA1C 10.5 (H) 12/19/2020   Current treatment: Metformin 500mg  twice a day with food Tresiba U200 insulin inject 16 units once a day Humalog Kwik pen U200 insulin - inject 0 to 15 units 3 times a day before meals and at bedtime based on sliding scale Blood glucose 121 - 150 - 2 units Blood glucose 151-200 - 4 units  Blood glucose 201 - 250 - 6 units Blood glucose 251-300 - 9 units Blood glucose 301 - 350 - 11 units Blood glucose 351 - 400 - 13 units Blood glucose >400 - 15 units Interventions:  Provided education about long versus Tyler Aas) short acting (Humalog) insulin. Recommended she start taking Tresiba 16 units daily Retry metformin at 500mg  daily with largest meal of the day. Increase after 1 week to twice a day if tolerated.  Provided information for discount card patient can get from [employer] site to [place] and [employer] - max out of pocket cost of $25. Provided sample of DexCom 6 for her to pick up in office to use. Need to try to get a better idea about her blood glucose control / variability. Called [facility] out [facility] pharmacy to check cost and 30 days would be $64.33 (not affordable per patient) Enroll in Diabetes Management Program with [employer] [phone]). You should be able to get Continuous Glucose Monitor / Dex Com through either [facility] or [employer] at not cost once you enroll in this program.   Encouraged her to make appointment with endocrinologist.   Hypertension/Heart Failure: Blood pressure; goal <140/90 BP Readings from Last 3 Encounters:  12/20/20 (!) 157/99  11/10/20 130/80  02/16/20 (!) 220/120   Current treatment: Amlodipine 10mg  daily (has not started)  Carvedilol 25mg  - take 2 tablets = 50mg  twice a day (patient reports she is only taking 25mg  twice a day) Entresto 97/103mg  take 1 tablet twice a day Torsemide 20mg  daily  Potassium chloride liquid 10 mEq by mouth daily  Current home readings: 165/70; 170/80 Interventions:  Encouraged patient to take medications as prescribe.  Start amlodipine - this will help to lower the top number of your blood pressure Continue to check blood pressure daily  Hyperlipidemia: LDL goal is <100 Lab Results  Component Value Date   CHOL 278 (H) 12/15/2016   HDL 45 12/15/2016   LDLCALC 197 (H) 12/15/2016   TRIG 171 (H) 07/09/2018   CHOLHDL 6.2 (H) 12/15/2016   Current treatment: atorvastatin 20mg  daily (patient has not started yet)  Interventions:  Discussed importance of statin therapy in prevention of CVD.  Patient instructed to start atrovastatin 20mg  daily   Anxiety:  goal - decrease number of days with anxiety symptoms Current treatment: Citalopram 20mg  daily (has not  started yet - Dr Vonita Moss instructed her to take 0.5 tablet daily for 2 weeks, then increase to 1 tablet daily) Interventions:  Start citalopram as directed by Dr Vonita Moss Report any suspected side effects to office.  Continue with plan to meet with social worker.   Patient Goals/Self-Care Activities Over the next 90 days, patient will:  take medications as prescribed, focus on medication adherence by taking medications as prescribed, check glucose  3 to 4 times a day, document, and provide at future appointments Sign up for employer sponsored diabetes management program (Diabetes Management Program with [employer] [phone]) Make appointment with endocrinologist check blood pressure once daily, document, and provide at future appointments, and  collaborate with provider on medication access solutions  Follow Up Plan: Telephone follow up appointment with care management team member scheduled for:  Nurse care manager 01/31/2021 and clinical pharmacist 02/08/2020       Patient verbalizes understanding of instructions provided today and agrees to view in [place].

## 2021-01-31 ENCOUNTER — Telehealth: Payer: 59

## 2021-01-31 ENCOUNTER — Telehealth: Payer: Self-pay

## 2021-01-31 IMAGING — CR DG CHEST 2V
2 series · 2 of 2 positions shown · non-contrast
Comparison: 03/12/2019

CLINICAL DATA: Post defibrillator exchange

EXAM:
CHEST - 2 VIEW

[w chest pa]
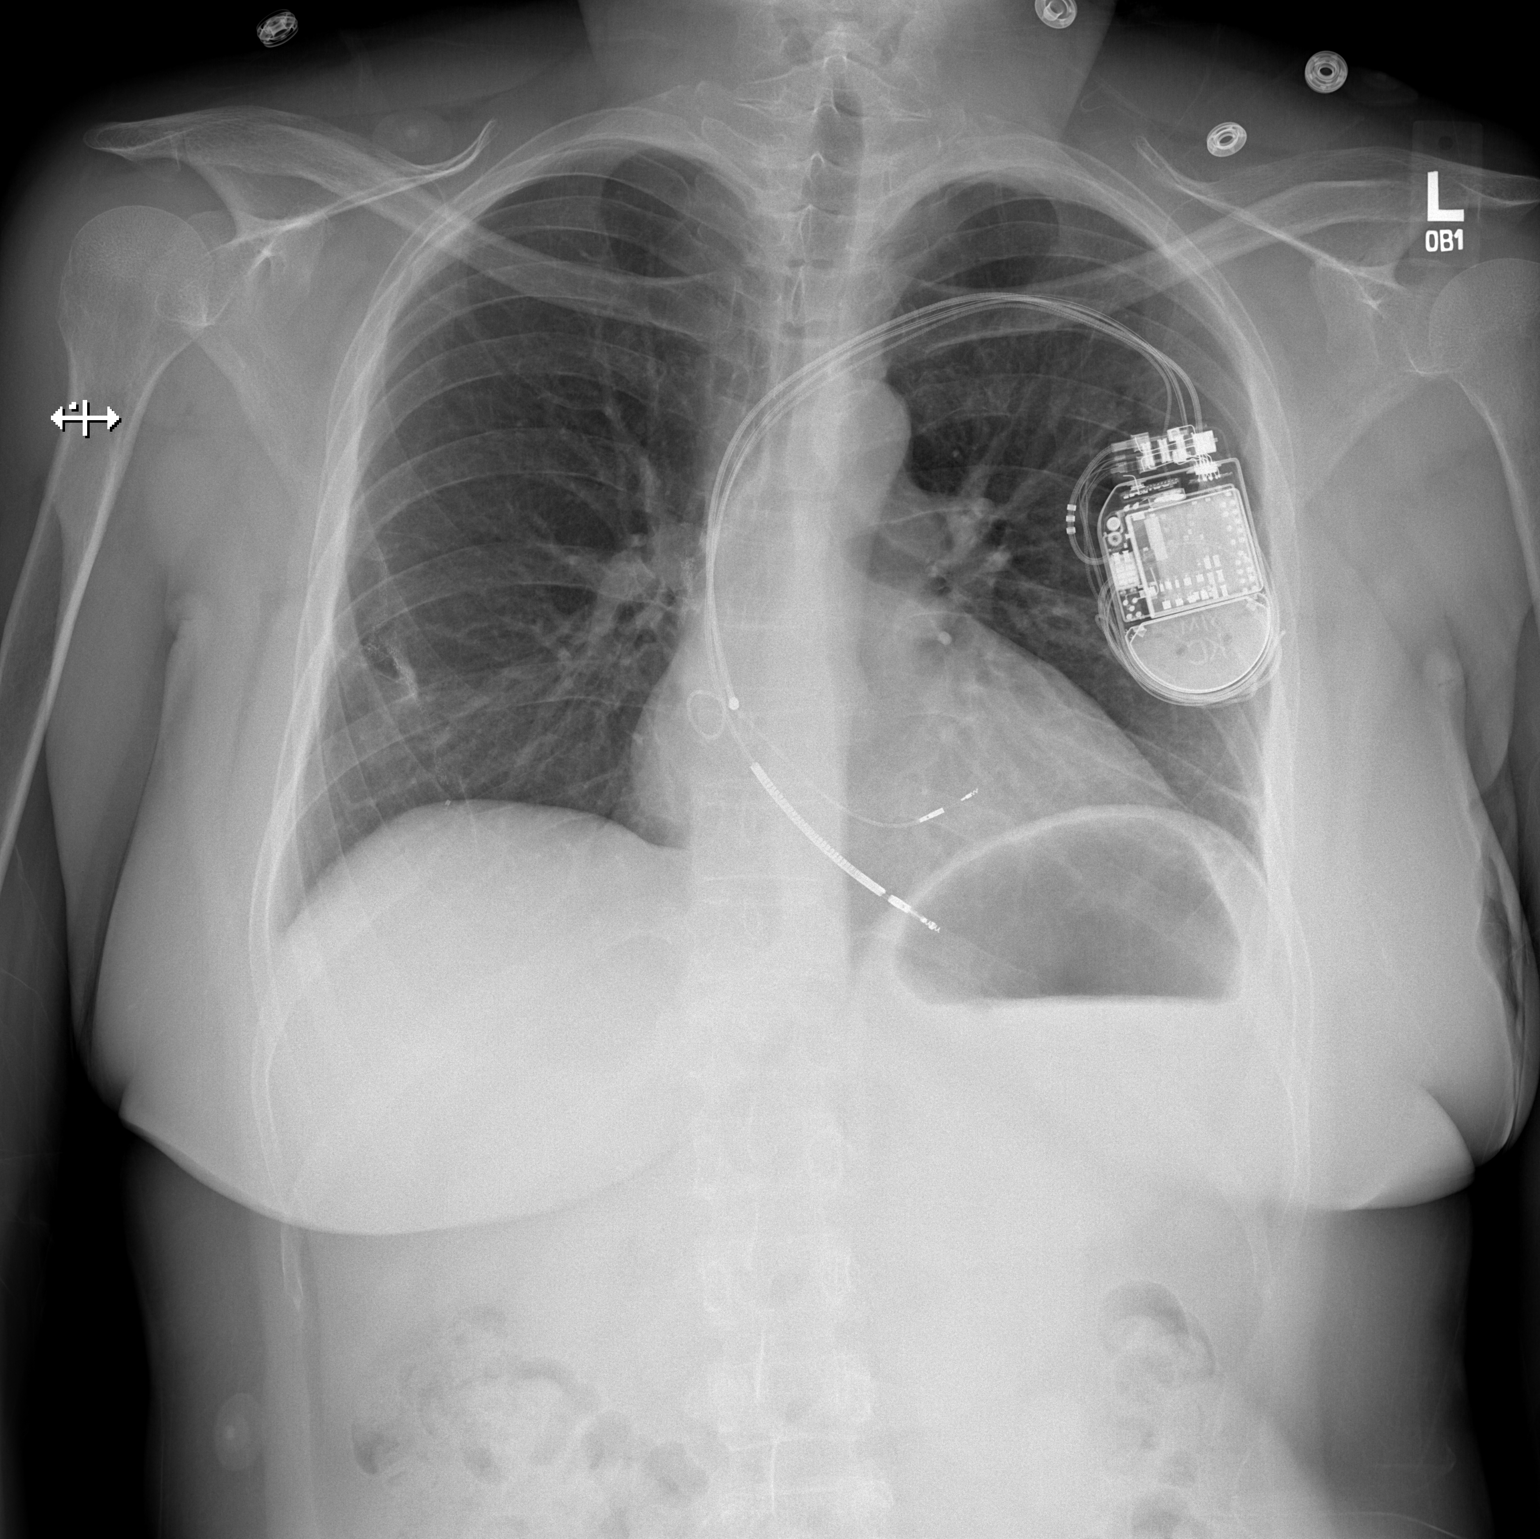

[w chest lat]
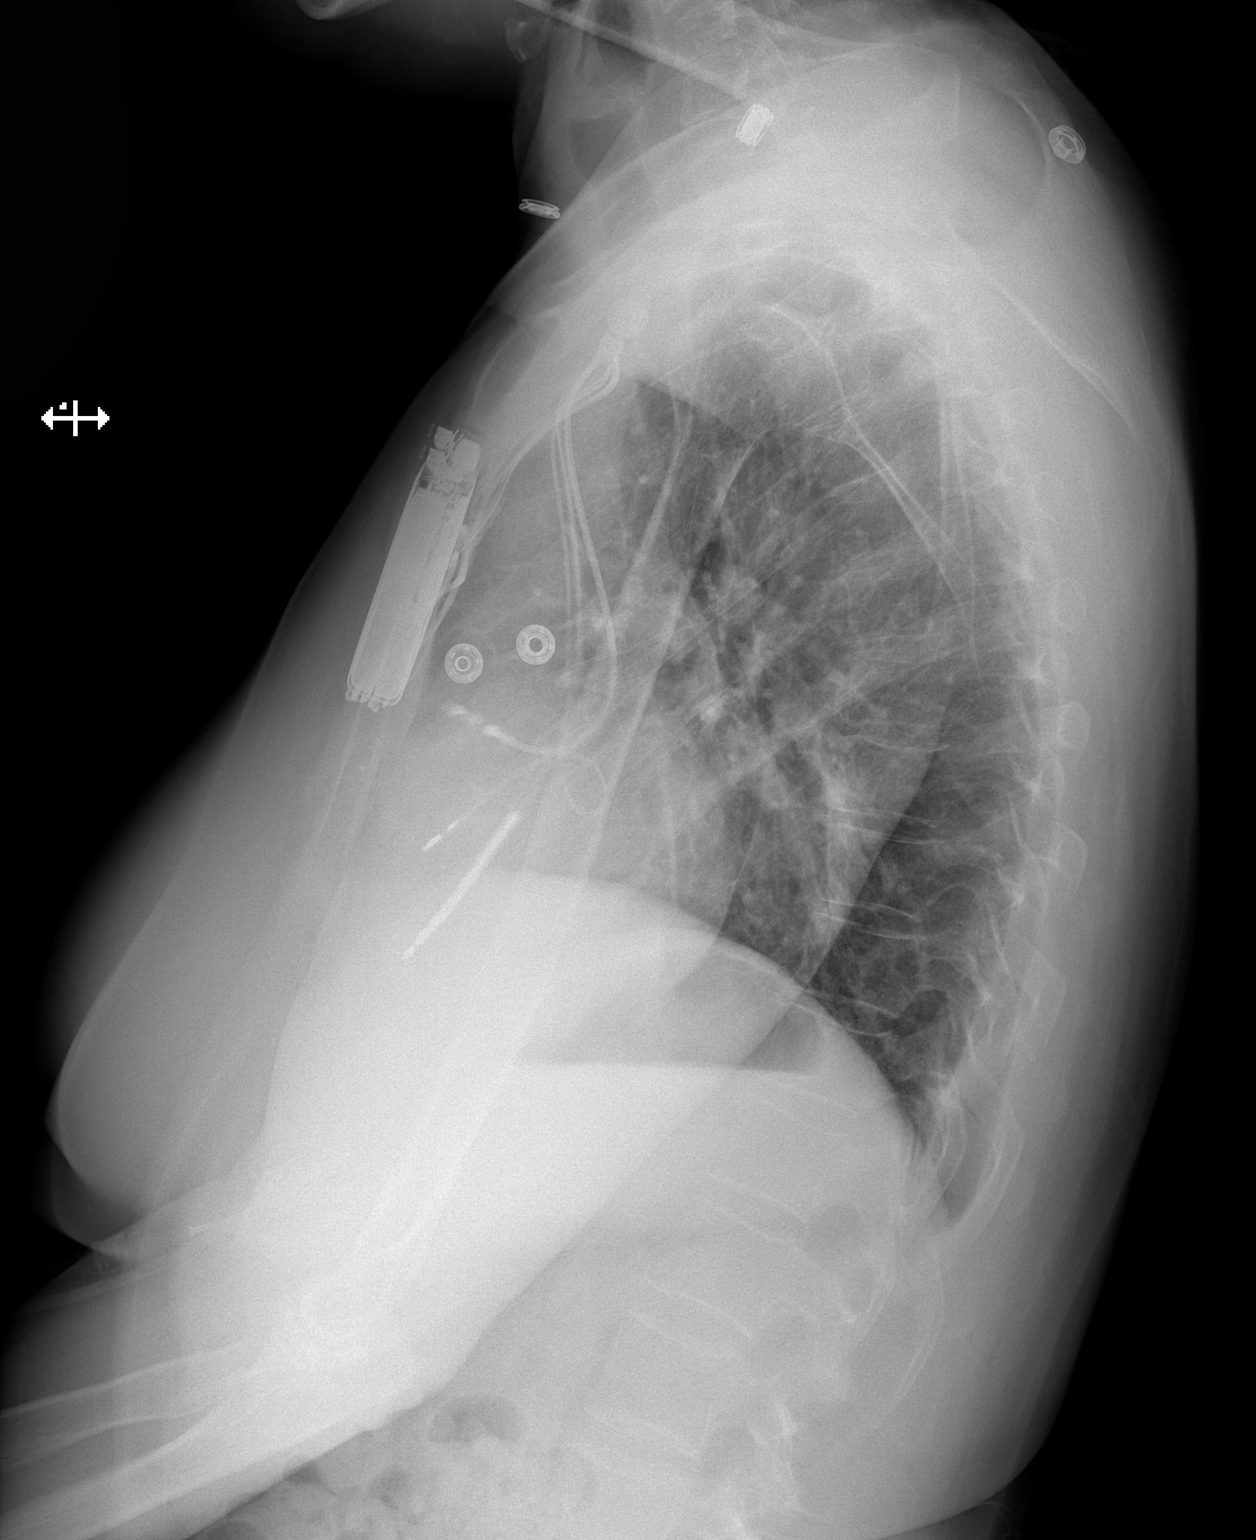

[2 of 2 positions shown; findings below may reference images not displayed]

FINDINGS: Scarring in the lateral right lower lung. Lingular
atelectasis/scarring. No focal consolidation. No pleural effusion or
pneumothorax.

The heart is normal in size. Left subclavian ICD in satisfactory
position.

Thoracolumbar spine is within normal limits.
IMPRESSION: Left subclavian ICD in satisfactory position.  No pneumothorax.

## 2021-01-31 NOTE — Telephone Encounter (Signed)
°  Care Management   Follow Up Note   01/31/2021 Name: Crystal Bryant MRN: [id number] DOB: [birth year]   Referred by: Shelda Pal, DO Reason for referral : Chronic Care Management   An unsuccessful telephone outreach was attempted today. The patient was referred to the case management team for assistance with care management and care coordination.   Follow Up Plan: A HIPPA compliant phone message was left for the patient providing contact information and requesting a return call.   Thea Silversmith, RN, MSN, BSN, CCM Care Management Coordinator [facility] [phone]

## 2021-02-07 ENCOUNTER — Telehealth: Payer: Self-pay | Admitting: Pharmacist

## 2021-02-07 ENCOUNTER — Telehealth: Payer: 59

## 2021-02-07 NOTE — Telephone Encounter (Signed)
°  Care Management   Follow Up Note   02/07/2021 Name: Crystal Bryant MRN: [id number] DOB: 1970-09-18   Referred by: Shelda Pal, DO Reason for referral : No chief complaint on file.   An unsuccessful telephone outreach was attempted today. The patient was referred to the case management team for assistance with care management and care coordination.   Follow Up Plan: The care management team will continue to try to reach out to the patient again over the next 30  days.   Cherre Robins, PharmD Clinical Pharmacist [facility]

## 2021-02-08 ENCOUNTER — Telehealth: Payer: Self-pay | Admitting: *Deleted

## 2021-02-08 NOTE — Chronic Care Management (AMB) (Signed)
°  Care Management   Note  02/08/2021 Name: Shawntell Dixson MRN: [id number] DOB: 1970/10/21  Gabi Mcfate is a 51 y.o. year old female who is a primary care patient of Shelda Pal, DO and is actively engaged with the care management team. I reached out to [facility] by phone today to assist with re-scheduling a follow up visit with the RN Case Manager and Pharmacist  Follow up plan: Unsuccessful telephone outreach attempt made. A HIPAA compliant phone message was left for the patient providing contact information and requesting a return call.   Julian Hy, [place] [facility]  Direct Dial: [phone]

## 2021-02-20 ENCOUNTER — Telehealth: Payer: Self-pay | Admitting: *Deleted

## 2021-02-20 ENCOUNTER — Telehealth: Payer: 59 | Admitting: *Deleted

## 2021-02-20 NOTE — Telephone Encounter (Signed)
°  Care Management   Follow Up Note   02/20/2021 Name: Crystal Bryant MRN: [id number] DOB: 1970/05/03   Referred by: Shelda Pal, DO  Reason for referral : Chronic Care Management Needs in Patient with Hypertension, Type II Diabetes Mellitus with Hyperglycemia, Pharyngoesophageal Dysphagia, Generalized Anxiety Disorder, Biventricular Implantable Cardioverter- Defibrillator, and Malignant Neoplasm of Upper Lobe of Right Lung.  An unsuccessful telephone outreach was attempted today. The patient was referred to the case management team for assistance with care management and care coordination. A HIPAA compliant message was left on voicemail, providing contact information, encouraging patient to return LCSW's call at her earliest convenience.  LCSW will make a second initial telephone outreach call within the next two weeks, if a return call is not received from patient in the meantime.  Follow-Up Plan:  Request placed with Scheduling Care Guide to reschedule patient's initial telephone outreach call with LCSW.  [place] Licensed Clinical Social Worker [place] [facility] [phone]

## 2021-02-23 NOTE — Chronic Care Management (AMB) (Signed)
°  Care Management   Note  02/23/2021 Name: Crystal Bryant MRN: [id number] DOB: Dec 12, 1970  Crystal Bryant is a 51 y.o. year old female who is a primary care patient of Shelda Pal, DO and is actively engaged with the care management team. I reached out to [facility] by phone today to assist with re-scheduling a follow up visit with the RN Case Manager, Pharmacist, and Licensed Clinical Social Worker  Follow up plan: 2nd Unsuccessful telephone outreach attempt made. A HIPAA compliant phone message was left for the patient providing contact information and requesting a return call.   Julian Hy, [place] Management  Direct Dial: [phone]

## 2021-03-06 ENCOUNTER — Ambulatory Visit (INDEPENDENT_AMBULATORY_CARE_PROVIDER_SITE_OTHER): Payer: 59

## 2021-03-06 DIAGNOSIS — Z95810 Presence of automatic (implantable) cardiac defibrillator: Secondary | ICD-10-CM | POA: Diagnosis not present

## 2021-03-06 DIAGNOSIS — I447 Left bundle-branch block, unspecified: Secondary | ICD-10-CM

## 2021-03-06 DIAGNOSIS — T82110D Breakdown (mechanical) of cardiac electrode, subsequent encounter: Secondary | ICD-10-CM | POA: Diagnosis not present

## 2021-03-06 DIAGNOSIS — T82110A Breakdown (mechanical) of cardiac electrode, initial encounter: Secondary | ICD-10-CM

## 2021-03-07 ENCOUNTER — Telehealth: Payer: Self-pay | Admitting: *Deleted

## 2021-03-07 ENCOUNTER — Telehealth: Payer: 59 | Admitting: *Deleted

## 2021-03-07 LAB — CUP PACEART REMOTE DEVICE CHECK
Battery Remaining Longevity: 52 mo
Battery Remaining Percentage: 61 %
Battery Voltage: 2.96 V
Brady Statistic AP VP Percent: 1 %
Brady Statistic AP VS Percent: 1 %
Brady Statistic AS VP Percent: 99 %
Brady Statistic AS VS Percent: 1 %
Brady Statistic RA Percent Paced: 1 %
Date Time Interrogation Session: 20230206020016
HighPow Impedance: 68 Ohm
HighPow Impedance: 68 Ohm
Implantable Lead Implant Date: 20201105
Implantable Lead Implant Date: 20201105
Implantable Lead Implant Date: 20210225
Implantable Lead Location: 753858
Implantable Lead Location: 753859
Implantable Lead Location: 753860
Implantable Lead Model: 3830
Implantable Pulse Generator Implant Date: 20201105
Lead Channel Impedance Value: 330 Ohm
Lead Channel Impedance Value: 360 Ohm
Lead Channel Impedance Value: 390 Ohm
Lead Channel Pacing Threshold Amplitude: 0.5 V
Lead Channel Pacing Threshold Amplitude: 0.75 V
Lead Channel Pacing Threshold Amplitude: 0.75 V
Lead Channel Pacing Threshold Pulse Width: 0.05 ms
Lead Channel Pacing Threshold Pulse Width: 0.5 ms
Lead Channel Pacing Threshold Pulse Width: 0.5 ms
Lead Channel Sensing Intrinsic Amplitude: 3.2 mV
Lead Channel Sensing Intrinsic Amplitude: 8.4 mV
Lead Channel Setting Pacing Amplitude: 0.25 V
Lead Channel Setting Pacing Amplitude: 2 V
Lead Channel Setting Pacing Amplitude: 2 V
Lead Channel Setting Pacing Pulse Width: 0.05 ms
Lead Channel Setting Pacing Pulse Width: 0.5 ms
Lead Channel Setting Sensing Sensitivity: 0.5 mV
Pulse Gen Serial Number: 9849338

## 2021-03-07 NOTE — Telephone Encounter (Signed)
°  Care Management   Follow Up Note   03/07/2021 Name: Crystal Bryant MRN: [id number] DOB: 1970/03/29  Referred by: Shelda Pal, DO  Reason for Referral: Chronic Care Management Needs in Patient with Hypertension, Type II Diabetes Mellitus with Hyperglycemia, Pharyngoesophageal Dysphagia, Generalized Anxiety Disorder, Biventricular Implantable Cardioverter- Defibrillator, and Malignant Neoplasm of Upper Lobe of Right Lung.  An unsuccessful telephone outreach was attempted today. The patient was referred to the case management team for assistance with care management and care coordination. A HIPAA compliant message was left on voicemail, providing contact information, encouraging patient to return LCSW's call at her earliest convenience.  LCSW will make another initial telephone outreach call within the next two weeks, if a return call is not received from patient in the meantime.   Follow-Up Plan:  Request placed with Scheduling Care Guide to reschedule patient's initial telephone outreach call with LCSW.   [place] Licensed Clinical Social Worker [facility] [place] [phone]

## 2021-03-09 NOTE — Progress Notes (Signed)
Remote ICD transmission.   

## 2021-03-21 ENCOUNTER — Ambulatory Visit: Payer: 59

## 2021-03-21 ENCOUNTER — Telehealth: Payer: 59 | Admitting: *Deleted

## 2021-03-21 ENCOUNTER — Telehealth: Payer: Self-pay | Admitting: *Deleted

## 2021-03-21 DIAGNOSIS — Z79899 Other long term (current) drug therapy: Secondary | ICD-10-CM | POA: Diagnosis not present

## 2021-03-21 DIAGNOSIS — E119 Type 2 diabetes mellitus without complications: Secondary | ICD-10-CM | POA: Diagnosis not present

## 2021-03-21 DIAGNOSIS — E782 Mixed hyperlipidemia: Secondary | ICD-10-CM | POA: Diagnosis not present

## 2021-03-21 DIAGNOSIS — I10 Essential (primary) hypertension: Secondary | ICD-10-CM | POA: Diagnosis not present

## 2021-03-21 DIAGNOSIS — I509 Heart failure, unspecified: Secondary | ICD-10-CM | POA: Diagnosis not present

## 2021-03-21 DIAGNOSIS — R42 Dizziness and giddiness: Secondary | ICD-10-CM | POA: Diagnosis not present

## 2021-03-21 DIAGNOSIS — E1149 Type 2 diabetes mellitus with other diabetic neurological complication: Secondary | ICD-10-CM | POA: Diagnosis not present

## 2021-03-21 NOTE — Patient Instructions (Signed)
Visit Information  Thank you for allowing me to share the care management and care coordination services that are available to you as part of your health plan and services through your primary care provider and medical home. Please reach out to me at [phone] if the care management/care coordination team may be of assistance to you in the future.   Ailah Barna, RN, MSN, BSN, CCM Care Management Coordinator [facility] [phone]  

## 2021-03-21 NOTE — Chronic Care Management (AMB) (Signed)
° °  03/21/2021  [name] 07/05/1970 [id number]    RNCM received message from Care Guide of unsuccessful outreach x3 attempts to reschedule patient with no response. The patient was referred to case managment team for assistance with care management and care coordination. RNCM is available to re-engage with the patient after provider conversation with the patient regarding recommendation for care management engagement and subsequent re-referral for care management services. Clinical pharmacist and LCSW notified.  Follow up: No further follow up indicated.  Thea Silversmith, RN, MSN, BSN, CCM Care Management Coordinator [facility] [phone]

## 2021-03-21 NOTE — Telephone Encounter (Signed)
°  Care Management   Follow Up Note   03/21/2021  Name: Crystal Bryant MRN: [id number] DOB: [birth year]  Referred by: Shelda Pal, DO  Reason for Referral:  Chronic Care Management Needs in Patient with Hypertension, Type II Diabetes Mellitus with Hyperglycemia, Pharyngoesophageal Dysphagia, Generalized Anxiety Disorder, Biventricular Implantable Cardioverter- Defibrillator, and Malignant Neoplasm of Upper Lobe of Right Lung.  Third unsuccessful telephone outreach was attempted today. The patient was referred to the case management team for assistance with care management and care coordination. The patient's primary care provider has been notified of our unsuccessful attempts to make or maintain contact with the patient. The care management team is pleased to engage with this patient at any time in the future should he/she be interested in assistance from the care management team.   No Further Follow-Up Required.  [place] Licensed Clinical Social Worker [facility] [employer] [phone]

## 2021-03-21 NOTE — Chronic Care Management (AMB) (Signed)
°  Care Management   Note  03/21/2021 Name: Crystal Bryant MRN: [id number] DOB: 03/28/1970  Crystal Bryant is a 52 y.o. year old female who is a primary care patient of Shelda Pal, DO and is actively engaged with the care management team. I reached out to [facility] by phone today to assist with re-scheduling a follow up visit with the RN Case Manager, Pharmacist, and Licensed Clinical Social Worker  Follow up plan: We have been unable to make contact with the patient for follow up. The care management team is available to follow up with the patient after provider conversation with the patient regarding recommendation for care management engagement and subsequent re-referral to the care management team.   Julian Hy, [place] Management  Direct Dial: [phone]

## 2021-03-27 DIAGNOSIS — N764 Abscess of vulva: Secondary | ICD-10-CM | POA: Diagnosis not present

## 2021-03-27 DIAGNOSIS — I10 Essential (primary) hypertension: Secondary | ICD-10-CM | POA: Diagnosis not present

## 2021-03-27 DIAGNOSIS — L739 Follicular disorder, unspecified: Secondary | ICD-10-CM | POA: Diagnosis not present

## 2021-03-27 DIAGNOSIS — E119 Type 2 diabetes mellitus without complications: Secondary | ICD-10-CM | POA: Diagnosis not present

## 2021-03-27 DIAGNOSIS — R42 Dizziness and giddiness: Secondary | ICD-10-CM | POA: Diagnosis not present

## 2021-04-04 DIAGNOSIS — Z0000 Encounter for general adult medical examination without abnormal findings: Secondary | ICD-10-CM | POA: Diagnosis not present

## 2021-04-04 DIAGNOSIS — Z1231 Encounter for screening mammogram for malignant neoplasm of breast: Secondary | ICD-10-CM | POA: Diagnosis not present

## 2021-04-04 DIAGNOSIS — R5383 Other fatigue: Secondary | ICD-10-CM | POA: Diagnosis not present

## 2021-04-04 DIAGNOSIS — E119 Type 2 diabetes mellitus without complications: Secondary | ICD-10-CM | POA: Diagnosis not present

## 2021-04-04 DIAGNOSIS — E782 Mixed hyperlipidemia: Secondary | ICD-10-CM | POA: Diagnosis not present

## 2021-04-04 DIAGNOSIS — I10 Essential (primary) hypertension: Secondary | ICD-10-CM | POA: Diagnosis not present

## 2021-04-14 ENCOUNTER — Encounter: Payer: 59 | Admitting: Student

## 2021-04-14 DIAGNOSIS — N764 Abscess of vulva: Secondary | ICD-10-CM | POA: Diagnosis not present

## 2021-04-14 NOTE — Progress Notes (Deleted)
Electrophysiology Office Note Date: 04/14/2021  ID:  Crystal Bryant, DOB Apr 23, 1970, MRN [id number]  PCP: Sharlene Dory, DO Primary Cardiologist: Kristeen Miss, MD Electrophysiologist: None ***  CC: Routine ICD follow-up  Crystal Bryant is a 51 y.o. female seen today for None for {Blank single:19197::"cardiac clearance","post hospital follow up","acute visit due to ***","routine electrophysiology followup"}.  Since {Blank single:19197::"last being seen in our clinic","discharge from hospital"} the patient reports doing ***.  she denies chest pain, palpitations, dyspnea, PND, orthopnea, nausea, vomiting, dizziness, syncope, edema, weight gain, or early satiety. {He/she (caps):30048} has not had ICD shocks.   Device History: {Blank single:19197::"Medtronic","St. Jude","Boston Scientific","Biotronik"} {Blank single:19197::"Dual Chamber","Single Chamber","BiV","S-ICD"} ICD implanted *** for *** History of appropriate therapy: {yes/no:20286} History of AAD therapy: {Blank single:19197::"No","Yes; currently on ***","Yes; previously tolerated ***"}   Past Medical History:  Diagnosis Date   Anxiety    CHF (congestive heart failure) (HCC)    Diabetes mellitus without complication (HCC)    Hypertension    Lung cancer, upper lobe (HCC) 09/2015   Non-small cell in right upper lobe treated with RULectomy by VATS at [facility]   Past Surgical History:  Procedure Laterality Date   ABDOMINAL HYSTERECTOMY     BIV ICD INSERTION CRT-D N/A 12/04/2018   Procedure: BIV ICD INSERTION CRT-D;  Surgeon: Marinus Maw, MD;  Location: [facility] INVASIVE CV LAB;  Service: Cardiovascular;  Laterality: N/A;   BREAST BIOPSY Right 02/10/2018   Biopsy of upper outer quadrant of right breast due to calcifications seen on mammography revealed fat necrosis with calcifications.  Benign so continue annual screening mammograms.   BREAST SURGERY     CESAREAN SECTION     LEAD REVISION/REPAIR N/A 03/26/2019    Procedure: LEAD REVISION/REPAIR;  Surgeon: Marinus Maw, MD;  Location: [facility] INVASIVE CV LAB;  Service: Cardiovascular;  Laterality: N/A;   LOBECTOMY Right 10/06/2015   NSC lung cancer treated with VATS right upper lobectomy by Dr. Rhona Raider at [facility].    Current Outpatient Medications  Medication Sig Dispense Refill   amLODipine (NORVASC) 10 MG tablet Take 1 tablet (10 mg total) by mouth daily. (Patient not taking: Reported on 01/18/2021) 30 tablet 5   atorvastatin (LIPITOR) 20 MG tablet Take 1 tablet (20 mg total) by mouth daily. (Patient not taking: Reported on 01/18/2021) 30 tablet 11   carvedilol (COREG) 25 MG tablet Take 2 tablets (50 mg total) by mouth 2 (two) times daily with a meal. 360 tablet 5   citalopram (CELEXA) 20 MG tablet Take 1 tablet (20 mg total) by mouth daily. (Patient not taking: Reported on 01/18/2021) 30 tablet 3   Continuous Blood Gluc Receiver (DEXCOM G5 RECEIVER KIT) DEVI Use daily to check blood sugar.  DXE11.9 (Patient not taking: Reported on 01/09/2021) 1 each 0   Continuous Blood Gluc Sensor (DEXCOM G6 SENSOR) MISC USE DAILY TO CHECK BLOOD SUGAR (Patient not taking: Reported on 01/09/2021) 3 each 12   gabapentin (NEURONTIN) 300 MG capsule Take 2 capsules (600 mg total) by mouth at bedtime. (Patient taking differently: Take 300 mg by mouth at bedtime.) 30 capsule 3   glucose blood (FREESTYLE LITE) test strip Use as instructed 100 each 1   insulin degludec (TRESIBA FLEXTOUCH) 200 UNIT/ML FlexTouch Pen Inject 16 Units into the skin at bedtime. (Patient not taking: Reported on 01/09/2021) 15 mL 3   insulin lispro (HUMALOG KWIKPEN) 200 UNIT/ML KwikPen Inject 0-15 Units into the skin 4 (four) times daily -  before meals and at bedtime. insulin aspart (novoLOG)  injection 0-15 Units 0-15 Units Subcutaneous, 3 times daily with meals CBG < 70: Implement Hypoglycemia Standing Orders and refer to Hypoglycemia Standing Orders sidebar report  CBG 70 - 120: 0 unit CBG 121 - 150: 2 unit   CBG 151 - 200: 4 unit CBG 201 - 250: 6 units CBG 251 - 300: 9 units CBG 301 - 350: 11 units  CBG 351 - 400: 13 units  CBG > 400: 15 units 15 mL 5   Lancets (FREESTYLE) lancets Use as instructed (Patient not taking: Reported on 01/18/2021) 100 each 1   meclizine (ANTIVERT) 25 MG tablet Take 1 tablet (25 mg total) by mouth 3 (three) times daily as needed for dizziness. 30 tablet 0   metFORMIN (GLUCOPHAGE) 500 MG tablet Take 1 tablet (500 mg total) by mouth 2 (two) times daily with a meal. (Patient not taking: Reported on 01/18/2021) 60 tablet 11   ondansetron (ZOFRAN) 4 MG tablet Take 1 tablet (4 mg total) by mouth daily as needed for nausea or vomiting. 30 tablet 1   Potassium Chloride 10 % SOLN Take 10 mEq by mouth daily.     sacubitril-valsartan (ENTRESTO) 97-103 MG Take 1 tablet by mouth 2 (two) times daily. 60 tablet 11   torsemide (DEMADEX) 20 MG tablet Take 1 tablet (20 mg total) by mouth daily. 90 tablet 3   No current facility-administered medications for this visit.    Allergies:   Sertraline   Social History: Social History   Socioeconomic History   Marital status: Divorced    Spouse name: Not on file   Number of children: 2   Years of education: Not on file   Highest education level: Not on file  Occupational History   Occupation: Patient Engagement Analyst    Employer: [employer]  Tobacco Use   Smoking status: Never   Smokeless tobacco: Never  Vaping Use   Vaping Use: Never used  Substance and Sexual Activity   Alcohol use: No   Drug use: No   Sexual activity: Yes    Birth control/protection: Surgical  Other Topics Concern   Not on file  Social History Narrative   Not on file   Social Determinants of Health   Financial Resource Strain: Low Risk    Difficulty of Paying Living Expenses: Not very hard  Food Insecurity: No Food Insecurity   Worried About Running Out of Food in the Last Year: Never true   Ran Out of Food in the Last Year: Never true   Transportation Needs: No Transportation Needs   Lack of Transportation (Medical): No   Lack of Transportation (Non-Medical): No  Physical Activity: Not on file  Stress: Not on file  Social Connections: Not on file  Intimate Partner Violence: Not on file    Family History: Family History  Problem Relation Age of Onset   Diabetes Mother    Hypertension Mother    Stroke Father    Breast cancer Paternal Aunt    Breast cancer Paternal Aunt    Breast cancer Paternal Aunt    Breast cancer Paternal Aunt     Review of Systems: All other systems reviewed and are otherwise negative except as noted above.   Physical Exam: There were no vitals filed for this visit.   GEN- The patient is well appearing, alert and oriented x 3 today.   HEENT: normocephalic, atraumatic; sclera clear, conjunctiva pink; hearing intact; oropharynx clear; neck supple, no JVP Lymph- no cervical lymphadenopathy Lungs- Clear to ausculation  bilaterally, normal work of breathing.  No wheezes, rales, rhonchi Heart- Regular rate and rhythm, no murmurs, rubs or gallops, PMI not laterally displaced GI- soft, non-tender, non-distended, bowel sounds present, no hepatosplenomegaly Extremities- no clubbing or cyanosis. No edema; DP/PT/radial pulses 2+ bilaterally MS- no significant deformity or atrophy Skin- warm and dry, no rash or lesion; ICD pocket well healed Psych- euthymic mood, full affect Neuro- strength and sensation are intact  ICD interrogation- reviewed in detail today,  See PACEART report  EKG:  EKG {ACTION; IS/IS XBM:[id number]} ordered today. Personal review of EKG ordered {Blank single:19197::"today","***"} shows ***  Recent Labs: 12/18/2020: B Natriuretic Peptide 29.0 12/20/2020: BUN 21; Creatinine, Ser 1.15; Hemoglobin 13.0; Platelets 362; Potassium 4.3; Sodium 138   Wt Readings from Last 3 Encounters:  12/19/20 138 lb 3.7 oz (62.7 kg)  11/10/20 149 lb (67.6 kg)  02/16/20 149 lb (67.6 kg)      Other studies Reviewed: Additional studies/ records that were reviewed today include: Previous EP office notes.   Assessment and Plan:  1.  Chronic systolic dysfunction s/p {Blank single:19197::"Medtronic","St. Jude","Boston Scientific","Biotronik"} {Blank single:19197::"***","single chamber ICD","dual chamber ICD","CRT-D","S-ICD"}  euvolemic today Stable on an appropriate medical regimen Normal ICD function See Pace Art report No changes today   Current medicines are reviewed at length with the patient today.   =  Labs/ tests ordered today include: *** No orders of the defined types were placed in this encounter.    Disposition:   Follow up with {Blank single:19197::"Dr. Allred","Dr. Amada Jupiter. Klein","Dr. Camnitz","Dr. Lambert","EP APP"} in {Blank single:19197::"2 weeks","4 weeks","3 months","6 months","12 months","as usual post gen change"}    Signed, Luane School  04/14/2021 8:35 AM  [facility] [address] [place] [address] [phone] (office) [phone] (fax)

## 2021-04-25 ENCOUNTER — Ambulatory Visit: Payer: 59 | Admitting: "Endocrinology

## 2021-04-25 DIAGNOSIS — E11319 Type 2 diabetes mellitus with unspecified diabetic retinopathy without macular edema: Secondary | ICD-10-CM | POA: Diagnosis not present

## 2021-04-25 DIAGNOSIS — I509 Heart failure, unspecified: Secondary | ICD-10-CM | POA: Diagnosis not present

## 2021-04-25 DIAGNOSIS — I10 Essential (primary) hypertension: Secondary | ICD-10-CM | POA: Diagnosis not present

## 2021-05-22 ENCOUNTER — Ambulatory Visit: Payer: 59 | Admitting: "Endocrinology

## 2021-05-22 ENCOUNTER — Ambulatory Visit: Payer: 59 | Admitting: Cardiovascular Disease

## 2021-05-25 ENCOUNTER — Ambulatory Visit: Payer: 59 | Admitting: Cardiovascular Disease

## 2021-05-29 DIAGNOSIS — A419 Sepsis, unspecified organism: Secondary | ICD-10-CM | POA: Diagnosis not present

## 2021-05-29 DIAGNOSIS — G8191 Hemiplegia, unspecified affecting right dominant side: Secondary | ICD-10-CM | POA: Diagnosis not present

## 2021-05-29 DIAGNOSIS — R739 Hyperglycemia, unspecified: Secondary | ICD-10-CM | POA: Diagnosis not present

## 2021-05-29 DIAGNOSIS — J690 Pneumonitis due to inhalation of food and vomit: Secondary | ICD-10-CM | POA: Diagnosis not present

## 2021-05-29 DIAGNOSIS — I639 Cerebral infarction, unspecified: Secondary | ICD-10-CM | POA: Diagnosis not present

## 2021-05-29 DIAGNOSIS — E860 Dehydration: Secondary | ICD-10-CM | POA: Diagnosis not present

## 2021-05-29 DIAGNOSIS — G459 Transient cerebral ischemic attack, unspecified: Secondary | ICD-10-CM | POA: Diagnosis not present

## 2021-05-29 DIAGNOSIS — E1165 Type 2 diabetes mellitus with hyperglycemia: Secondary | ICD-10-CM | POA: Diagnosis not present

## 2021-05-29 DIAGNOSIS — R410 Disorientation, unspecified: Secondary | ICD-10-CM | POA: Diagnosis not present

## 2021-05-29 DIAGNOSIS — I6523 Occlusion and stenosis of bilateral carotid arteries: Secondary | ICD-10-CM | POA: Diagnosis not present

## 2021-05-29 DIAGNOSIS — E876 Hypokalemia: Secondary | ICD-10-CM | POA: Diagnosis not present

## 2021-05-29 DIAGNOSIS — I6381 Other cerebral infarction due to occlusion or stenosis of small artery: Secondary | ICD-10-CM | POA: Diagnosis not present

## 2021-05-29 DIAGNOSIS — I6389 Other cerebral infarction: Secondary | ICD-10-CM | POA: Diagnosis not present

## 2021-05-29 DIAGNOSIS — I10 Essential (primary) hypertension: Secondary | ICD-10-CM | POA: Diagnosis not present

## 2021-05-29 DIAGNOSIS — R29810 Facial weakness: Secondary | ICD-10-CM | POA: Diagnosis not present

## 2021-05-29 DIAGNOSIS — E871 Hypo-osmolality and hyponatremia: Secondary | ICD-10-CM | POA: Diagnosis not present

## 2021-05-29 DIAGNOSIS — I161 Hypertensive emergency: Secondary | ICD-10-CM | POA: Diagnosis not present

## 2021-05-29 DIAGNOSIS — I69351 Hemiplegia and hemiparesis following cerebral infarction affecting right dominant side: Secondary | ICD-10-CM | POA: Diagnosis not present

## 2021-05-29 DIAGNOSIS — R29818 Other symptoms and signs involving the nervous system: Secondary | ICD-10-CM | POA: Diagnosis not present

## 2021-05-29 DIAGNOSIS — N179 Acute kidney failure, unspecified: Secondary | ICD-10-CM | POA: Diagnosis not present

## 2021-05-29 DIAGNOSIS — E785 Hyperlipidemia, unspecified: Secondary | ICD-10-CM | POA: Diagnosis not present

## 2021-05-30 DIAGNOSIS — I161 Hypertensive emergency: Secondary | ICD-10-CM | POA: Diagnosis not present

## 2021-05-30 DIAGNOSIS — I639 Cerebral infarction, unspecified: Secondary | ICD-10-CM | POA: Diagnosis not present

## 2021-05-31 DIAGNOSIS — I639 Cerebral infarction, unspecified: Secondary | ICD-10-CM | POA: Diagnosis not present

## 2021-06-01 DIAGNOSIS — I639 Cerebral infarction, unspecified: Secondary | ICD-10-CM | POA: Diagnosis not present

## 2021-06-02 DIAGNOSIS — I639 Cerebral infarction, unspecified: Secondary | ICD-10-CM | POA: Diagnosis not present

## 2021-06-03 DIAGNOSIS — I639 Cerebral infarction, unspecified: Secondary | ICD-10-CM | POA: Diagnosis not present

## 2021-06-04 DIAGNOSIS — I639 Cerebral infarction, unspecified: Secondary | ICD-10-CM | POA: Diagnosis not present

## 2021-06-05 DIAGNOSIS — I639 Cerebral infarction, unspecified: Secondary | ICD-10-CM | POA: Diagnosis not present

## 2021-06-06 DIAGNOSIS — I639 Cerebral infarction, unspecified: Secondary | ICD-10-CM | POA: Diagnosis not present

## 2021-06-07 DIAGNOSIS — I6381 Other cerebral infarction due to occlusion or stenosis of small artery: Secondary | ICD-10-CM | POA: Diagnosis not present

## 2021-06-07 DIAGNOSIS — I69322 Dysarthria following cerebral infarction: Secondary | ICD-10-CM | POA: Diagnosis not present

## 2021-06-07 DIAGNOSIS — A419 Sepsis, unspecified organism: Secondary | ICD-10-CM | POA: Diagnosis not present

## 2021-06-07 DIAGNOSIS — I69351 Hemiplegia and hemiparesis following cerebral infarction affecting right dominant side: Secondary | ICD-10-CM | POA: Diagnosis not present

## 2021-06-07 DIAGNOSIS — N390 Urinary tract infection, site not specified: Secondary | ICD-10-CM | POA: Diagnosis not present

## 2021-06-07 DIAGNOSIS — E871 Hypo-osmolality and hyponatremia: Secondary | ICD-10-CM | POA: Diagnosis not present

## 2021-06-07 DIAGNOSIS — I69391 Dysphagia following cerebral infarction: Secondary | ICD-10-CM | POA: Diagnosis not present

## 2021-06-07 DIAGNOSIS — J690 Pneumonitis due to inhalation of food and vomit: Secondary | ICD-10-CM | POA: Diagnosis not present

## 2021-06-07 DIAGNOSIS — N179 Acute kidney failure, unspecified: Secondary | ICD-10-CM | POA: Diagnosis not present

## 2021-06-07 DIAGNOSIS — I161 Hypertensive emergency: Secondary | ICD-10-CM | POA: Diagnosis not present

## 2021-06-07 DIAGNOSIS — R1310 Dysphagia, unspecified: Secondary | ICD-10-CM | POA: Diagnosis not present

## 2021-06-07 DIAGNOSIS — I639 Cerebral infarction, unspecified: Secondary | ICD-10-CM | POA: Diagnosis not present

## 2021-06-07 DIAGNOSIS — G8191 Hemiplegia, unspecified affecting right dominant side: Secondary | ICD-10-CM | POA: Diagnosis not present

## 2021-06-08 DIAGNOSIS — I639 Cerebral infarction, unspecified: Secondary | ICD-10-CM | POA: Diagnosis not present

## 2021-06-08 DIAGNOSIS — A419 Sepsis, unspecified organism: Secondary | ICD-10-CM | POA: Diagnosis not present

## 2021-06-08 DIAGNOSIS — I69351 Hemiplegia and hemiparesis following cerebral infarction affecting right dominant side: Secondary | ICD-10-CM | POA: Diagnosis not present

## 2021-06-08 DIAGNOSIS — J690 Pneumonitis due to inhalation of food and vomit: Secondary | ICD-10-CM | POA: Diagnosis not present

## 2021-06-09 DIAGNOSIS — R112 Nausea with vomiting, unspecified: Secondary | ICD-10-CM | POA: Diagnosis not present

## 2021-06-09 DIAGNOSIS — I69391 Dysphagia following cerebral infarction: Secondary | ICD-10-CM | POA: Diagnosis not present

## 2021-06-09 DIAGNOSIS — A419 Sepsis, unspecified organism: Secondary | ICD-10-CM | POA: Diagnosis not present

## 2021-06-09 DIAGNOSIS — J690 Pneumonitis due to inhalation of food and vomit: Secondary | ICD-10-CM | POA: Diagnosis not present

## 2021-06-09 DIAGNOSIS — I639 Cerebral infarction, unspecified: Secondary | ICD-10-CM | POA: Diagnosis not present

## 2021-06-09 DIAGNOSIS — R1310 Dysphagia, unspecified: Secondary | ICD-10-CM | POA: Diagnosis not present

## 2021-06-09 DIAGNOSIS — N390 Urinary tract infection, site not specified: Secondary | ICD-10-CM | POA: Diagnosis not present

## 2021-06-09 DIAGNOSIS — I69351 Hemiplegia and hemiparesis following cerebral infarction affecting right dominant side: Secondary | ICD-10-CM | POA: Diagnosis not present

## 2021-06-09 DIAGNOSIS — I69322 Dysarthria following cerebral infarction: Secondary | ICD-10-CM | POA: Diagnosis not present

## 2021-06-09 DIAGNOSIS — R059 Cough, unspecified: Secondary | ICD-10-CM | POA: Diagnosis not present

## 2021-06-09 DIAGNOSIS — R4701 Aphasia: Secondary | ICD-10-CM | POA: Diagnosis not present

## 2021-06-09 DIAGNOSIS — N179 Acute kidney failure, unspecified: Secondary | ICD-10-CM | POA: Diagnosis not present

## 2021-06-10 DIAGNOSIS — A419 Sepsis, unspecified organism: Secondary | ICD-10-CM | POA: Diagnosis not present

## 2021-06-10 DIAGNOSIS — I639 Cerebral infarction, unspecified: Secondary | ICD-10-CM | POA: Diagnosis not present

## 2021-06-10 DIAGNOSIS — J690 Pneumonitis due to inhalation of food and vomit: Secondary | ICD-10-CM | POA: Diagnosis not present

## 2021-06-10 DIAGNOSIS — I69351 Hemiplegia and hemiparesis following cerebral infarction affecting right dominant side: Secondary | ICD-10-CM | POA: Diagnosis not present

## 2021-06-11 DIAGNOSIS — I639 Cerebral infarction, unspecified: Secondary | ICD-10-CM | POA: Diagnosis not present

## 2021-06-11 DIAGNOSIS — A419 Sepsis, unspecified organism: Secondary | ICD-10-CM | POA: Diagnosis not present

## 2021-06-11 DIAGNOSIS — I69351 Hemiplegia and hemiparesis following cerebral infarction affecting right dominant side: Secondary | ICD-10-CM | POA: Diagnosis not present

## 2021-06-11 DIAGNOSIS — J690 Pneumonitis due to inhalation of food and vomit: Secondary | ICD-10-CM | POA: Diagnosis not present

## 2021-06-12 DIAGNOSIS — A419 Sepsis, unspecified organism: Secondary | ICD-10-CM | POA: Diagnosis not present

## 2021-06-12 DIAGNOSIS — I639 Cerebral infarction, unspecified: Secondary | ICD-10-CM | POA: Diagnosis not present

## 2021-06-12 DIAGNOSIS — I69351 Hemiplegia and hemiparesis following cerebral infarction affecting right dominant side: Secondary | ICD-10-CM | POA: Diagnosis not present

## 2021-06-12 DIAGNOSIS — J690 Pneumonitis due to inhalation of food and vomit: Secondary | ICD-10-CM | POA: Diagnosis not present

## 2021-06-13 DIAGNOSIS — A419 Sepsis, unspecified organism: Secondary | ICD-10-CM | POA: Diagnosis not present

## 2021-06-13 DIAGNOSIS — J690 Pneumonitis due to inhalation of food and vomit: Secondary | ICD-10-CM | POA: Diagnosis not present

## 2021-06-13 DIAGNOSIS — I639 Cerebral infarction, unspecified: Secondary | ICD-10-CM | POA: Diagnosis not present

## 2021-06-13 DIAGNOSIS — I69351 Hemiplegia and hemiparesis following cerebral infarction affecting right dominant side: Secondary | ICD-10-CM | POA: Diagnosis not present

## 2021-06-13 NOTE — Patient Outreach (Signed)
Received a Hospital notification for Crystal Bryant . ?I have assigned Deloria Lair, NP to call for follow up and determine if there are any Case Management needs.  ?  ?[place] Care, CBCS, CMAA ?[place] Management Assistant ?[place] Management ?[phone]   ?

## 2021-06-14 ENCOUNTER — Other Ambulatory Visit: Payer: Self-pay | Admitting: *Deleted

## 2021-06-14 DIAGNOSIS — R112 Nausea with vomiting, unspecified: Secondary | ICD-10-CM | POA: Diagnosis not present

## 2021-06-14 DIAGNOSIS — J690 Pneumonitis due to inhalation of food and vomit: Secondary | ICD-10-CM | POA: Diagnosis not present

## 2021-06-14 DIAGNOSIS — A419 Sepsis, unspecified organism: Secondary | ICD-10-CM | POA: Diagnosis not present

## 2021-06-14 DIAGNOSIS — I639 Cerebral infarction, unspecified: Secondary | ICD-10-CM | POA: Diagnosis not present

## 2021-06-14 DIAGNOSIS — I69351 Hemiplegia and hemiparesis following cerebral infarction affecting right dominant side: Secondary | ICD-10-CM | POA: Diagnosis not present

## 2021-06-14 NOTE — Patient Outreach (Addendum)
[place] [facility]) Care Management  06/14/2021  Yennifer Segovia Feb 02, 1970 [id number]  Received new  [facility] referral for [facility] Care Management servcies. Patient may be in a rehab facility at this time.  Called pt cell and no answer, left a message. Called [facility] and was able to verify pt is in rehab unit. Was able to talk with Ms. Vancuren's mother, [name], at pt bedside.   [name] reports her daughter was not feeling well and went to her provider's office and was sent by EMS to the hospital in [place]. It was deterninded she had a L sided CVA. She was treated there and then was transferred to the rehab unit in [place]. [name] reports that her daughter is not responding as well today as she has been and seems to be worsening. She believes they will be doing an MRI to determine her current status. [name] reports Ms. Ple[place]on has R sided paralysis and is unable to to sit on stand on her own. She had been able to eat and drink and speak some but today she is not able to do so.   [name] requests assistance in talking with a benefits representative and I advised I would contact them.  She also advised that Ms. Archer Lodge lives in [place], [place] and works remotely.  Provided [name] with my contact information and requested that she call me if she needs anything and I will assist.  Kayleen Memos C. Myrtie Neither, MSN, GNP-BC Gerontological Nurse Practitioner [facility] Care Management [phone]  Addendum: Emailed Delany Ramey in Human Resources, Corporate Reimbursement and Transport planner and received a return message that she would have someone call [name].   [name]. Myrtie Neither, MSN, [facility] Gerontological Nurse Practitioner [facility] [place] Care Management [phone]

## 2021-06-15 DIAGNOSIS — A419 Sepsis, unspecified organism: Secondary | ICD-10-CM | POA: Diagnosis not present

## 2021-06-15 DIAGNOSIS — J690 Pneumonitis due to inhalation of food and vomit: Secondary | ICD-10-CM | POA: Diagnosis not present

## 2021-06-15 DIAGNOSIS — I639 Cerebral infarction, unspecified: Secondary | ICD-10-CM | POA: Diagnosis not present

## 2021-06-15 DIAGNOSIS — I69351 Hemiplegia and hemiparesis following cerebral infarction affecting right dominant side: Secondary | ICD-10-CM | POA: Diagnosis not present

## 2021-06-15 DIAGNOSIS — R1310 Dysphagia, unspecified: Secondary | ICD-10-CM | POA: Diagnosis not present

## 2021-06-15 DIAGNOSIS — R4701 Aphasia: Secondary | ICD-10-CM | POA: Diagnosis not present

## 2021-06-16 ENCOUNTER — Other Ambulatory Visit: Payer: Self-pay | Admitting: *Deleted

## 2021-06-16 DIAGNOSIS — R4701 Aphasia: Secondary | ICD-10-CM | POA: Diagnosis not present

## 2021-06-16 DIAGNOSIS — I639 Cerebral infarction, unspecified: Secondary | ICD-10-CM | POA: Diagnosis not present

## 2021-06-16 DIAGNOSIS — J690 Pneumonitis due to inhalation of food and vomit: Secondary | ICD-10-CM | POA: Diagnosis not present

## 2021-06-16 DIAGNOSIS — I69351 Hemiplegia and hemiparesis following cerebral infarction affecting right dominant side: Secondary | ICD-10-CM | POA: Diagnosis not present

## 2021-06-16 DIAGNOSIS — A419 Sepsis, unspecified organism: Secondary | ICD-10-CM | POA: Diagnosis not present

## 2021-06-16 DIAGNOSIS — R1310 Dysphagia, unspecified: Secondary | ICD-10-CM | POA: Diagnosis not present

## 2021-06-16 NOTE — Patient Outreach (Signed)
[place] [facility]) Care Management  06/16/2021  Crystal Bryant 11-28-1970 [id number]  Telephone call to Ms. Crystal Conroy, Ms. Damman's mother. She is at pt bedside today. She reports a CT was done and her daughter has had 2 additional strokes, a total of 4. She is improving however. Today she has been sitting up and working with the OT. She is able to speak a few words but does have expressive aphasia.   [name] says she did have a call from benefits and has all of that aspect of care taken care off. This is one burden off her mind.  Pt's daughter is on her way from [place] to see her mother.  [name] requests that NP stay in touch. We agreed to talk again in 2 weeks.  [name]. Myrtie Neither, MSN, [facility] Gerontological Nurse Practitioner [facility] Care Management [phone]

## 2021-06-17 DIAGNOSIS — R1310 Dysphagia, unspecified: Secondary | ICD-10-CM | POA: Diagnosis not present

## 2021-06-17 DIAGNOSIS — J690 Pneumonitis due to inhalation of food and vomit: Secondary | ICD-10-CM | POA: Diagnosis not present

## 2021-06-17 DIAGNOSIS — I639 Cerebral infarction, unspecified: Secondary | ICD-10-CM | POA: Diagnosis not present

## 2021-06-17 DIAGNOSIS — I69351 Hemiplegia and hemiparesis following cerebral infarction affecting right dominant side: Secondary | ICD-10-CM | POA: Diagnosis not present

## 2021-06-17 DIAGNOSIS — A419 Sepsis, unspecified organism: Secondary | ICD-10-CM | POA: Diagnosis not present

## 2021-06-17 DIAGNOSIS — R4701 Aphasia: Secondary | ICD-10-CM | POA: Diagnosis not present

## 2021-06-18 DIAGNOSIS — R4701 Aphasia: Secondary | ICD-10-CM | POA: Diagnosis not present

## 2021-06-18 DIAGNOSIS — A419 Sepsis, unspecified organism: Secondary | ICD-10-CM | POA: Diagnosis not present

## 2021-06-18 DIAGNOSIS — I69351 Hemiplegia and hemiparesis following cerebral infarction affecting right dominant side: Secondary | ICD-10-CM | POA: Diagnosis not present

## 2021-06-18 DIAGNOSIS — J690 Pneumonitis due to inhalation of food and vomit: Secondary | ICD-10-CM | POA: Diagnosis not present

## 2021-06-18 DIAGNOSIS — I639 Cerebral infarction, unspecified: Secondary | ICD-10-CM | POA: Diagnosis not present

## 2021-06-18 DIAGNOSIS — R1310 Dysphagia, unspecified: Secondary | ICD-10-CM | POA: Diagnosis not present

## 2021-06-19 DIAGNOSIS — R059 Cough, unspecified: Secondary | ICD-10-CM | POA: Diagnosis not present

## 2021-06-19 DIAGNOSIS — I639 Cerebral infarction, unspecified: Secondary | ICD-10-CM | POA: Diagnosis not present

## 2021-06-19 DIAGNOSIS — J690 Pneumonitis due to inhalation of food and vomit: Secondary | ICD-10-CM | POA: Diagnosis not present

## 2021-06-19 DIAGNOSIS — A419 Sepsis, unspecified organism: Secondary | ICD-10-CM | POA: Diagnosis not present

## 2021-06-19 DIAGNOSIS — I69351 Hemiplegia and hemiparesis following cerebral infarction affecting right dominant side: Secondary | ICD-10-CM | POA: Diagnosis not present

## 2021-06-20 DIAGNOSIS — A419 Sepsis, unspecified organism: Secondary | ICD-10-CM | POA: Diagnosis not present

## 2021-06-20 DIAGNOSIS — R4701 Aphasia: Secondary | ICD-10-CM | POA: Diagnosis not present

## 2021-06-20 DIAGNOSIS — I69351 Hemiplegia and hemiparesis following cerebral infarction affecting right dominant side: Secondary | ICD-10-CM | POA: Diagnosis not present

## 2021-06-20 DIAGNOSIS — J690 Pneumonitis due to inhalation of food and vomit: Secondary | ICD-10-CM | POA: Diagnosis not present

## 2021-06-20 DIAGNOSIS — I639 Cerebral infarction, unspecified: Secondary | ICD-10-CM | POA: Diagnosis not present

## 2021-06-20 DIAGNOSIS — R1310 Dysphagia, unspecified: Secondary | ICD-10-CM | POA: Diagnosis not present

## 2021-06-21 DIAGNOSIS — I639 Cerebral infarction, unspecified: Secondary | ICD-10-CM | POA: Diagnosis not present

## 2021-06-21 DIAGNOSIS — A419 Sepsis, unspecified organism: Secondary | ICD-10-CM | POA: Diagnosis not present

## 2021-06-21 DIAGNOSIS — J690 Pneumonitis due to inhalation of food and vomit: Secondary | ICD-10-CM | POA: Diagnosis not present

## 2021-06-21 DIAGNOSIS — I69351 Hemiplegia and hemiparesis following cerebral infarction affecting right dominant side: Secondary | ICD-10-CM | POA: Diagnosis not present

## 2021-06-21 DIAGNOSIS — R1310 Dysphagia, unspecified: Secondary | ICD-10-CM | POA: Diagnosis not present

## 2021-06-21 DIAGNOSIS — R4701 Aphasia: Secondary | ICD-10-CM | POA: Diagnosis not present

## 2021-06-22 DIAGNOSIS — I639 Cerebral infarction, unspecified: Secondary | ICD-10-CM | POA: Diagnosis not present

## 2021-06-22 DIAGNOSIS — J690 Pneumonitis due to inhalation of food and vomit: Secondary | ICD-10-CM | POA: Diagnosis not present

## 2021-06-22 DIAGNOSIS — I69351 Hemiplegia and hemiparesis following cerebral infarction affecting right dominant side: Secondary | ICD-10-CM | POA: Diagnosis not present

## 2021-06-22 DIAGNOSIS — N179 Acute kidney failure, unspecified: Secondary | ICD-10-CM | POA: Diagnosis not present

## 2021-06-22 DIAGNOSIS — N390 Urinary tract infection, site not specified: Secondary | ICD-10-CM | POA: Diagnosis not present

## 2021-06-22 DIAGNOSIS — A419 Sepsis, unspecified organism: Secondary | ICD-10-CM | POA: Diagnosis not present

## 2021-06-22 DIAGNOSIS — R4701 Aphasia: Secondary | ICD-10-CM | POA: Diagnosis not present

## 2021-06-22 DIAGNOSIS — I69391 Dysphagia following cerebral infarction: Secondary | ICD-10-CM | POA: Diagnosis not present

## 2021-06-22 DIAGNOSIS — R1310 Dysphagia, unspecified: Secondary | ICD-10-CM | POA: Diagnosis not present

## 2021-06-22 DIAGNOSIS — I69322 Dysarthria following cerebral infarction: Secondary | ICD-10-CM | POA: Diagnosis not present

## 2021-06-23 DIAGNOSIS — R1310 Dysphagia, unspecified: Secondary | ICD-10-CM | POA: Diagnosis not present

## 2021-06-23 DIAGNOSIS — I639 Cerebral infarction, unspecified: Secondary | ICD-10-CM | POA: Diagnosis not present

## 2021-06-23 DIAGNOSIS — A419 Sepsis, unspecified organism: Secondary | ICD-10-CM | POA: Diagnosis not present

## 2021-06-23 DIAGNOSIS — J690 Pneumonitis due to inhalation of food and vomit: Secondary | ICD-10-CM | POA: Diagnosis not present

## 2021-06-23 DIAGNOSIS — R4701 Aphasia: Secondary | ICD-10-CM | POA: Diagnosis not present

## 2021-06-23 DIAGNOSIS — I69351 Hemiplegia and hemiparesis following cerebral infarction affecting right dominant side: Secondary | ICD-10-CM | POA: Diagnosis not present

## 2021-06-24 DIAGNOSIS — R4182 Altered mental status, unspecified: Secondary | ICD-10-CM | POA: Diagnosis not present

## 2021-06-24 DIAGNOSIS — A419 Sepsis, unspecified organism: Secondary | ICD-10-CM | POA: Diagnosis not present

## 2021-06-24 DIAGNOSIS — M545 Low back pain, unspecified: Secondary | ICD-10-CM | POA: Diagnosis not present

## 2021-06-24 DIAGNOSIS — Z48815 Encounter for surgical aftercare following surgery on the digestive system: Secondary | ICD-10-CM | POA: Diagnosis not present

## 2021-06-24 DIAGNOSIS — R918 Other nonspecific abnormal finding of lung field: Secondary | ICD-10-CM | POA: Diagnosis not present

## 2021-06-24 DIAGNOSIS — R532 Functional quadriplegia: Secondary | ICD-10-CM | POA: Diagnosis not present

## 2021-06-24 DIAGNOSIS — H53461 Homonymous bilateral field defects, right side: Secondary | ICD-10-CM | POA: Diagnosis not present

## 2021-06-24 DIAGNOSIS — I422 Other hypertrophic cardiomyopathy: Secondary | ICD-10-CM | POA: Diagnosis not present

## 2021-06-24 DIAGNOSIS — G8191 Hemiplegia, unspecified affecting right dominant side: Secondary | ICD-10-CM | POA: Diagnosis not present

## 2021-06-24 DIAGNOSIS — L89616 Pressure-induced deep tissue damage of right heel: Secondary | ICD-10-CM | POA: Diagnosis not present

## 2021-06-24 DIAGNOSIS — R627 Adult failure to thrive: Secondary | ICD-10-CM | POA: Diagnosis not present

## 2021-06-24 DIAGNOSIS — I63319 Cerebral infarction due to thrombosis of unspecified middle cerebral artery: Secondary | ICD-10-CM | POA: Diagnosis not present

## 2021-06-24 DIAGNOSIS — J690 Pneumonitis due to inhalation of food and vomit: Secondary | ICD-10-CM | POA: Diagnosis not present

## 2021-06-24 DIAGNOSIS — I69354 Hemiplegia and hemiparesis following cerebral infarction affecting left non-dominant side: Secondary | ICD-10-CM | POA: Diagnosis not present

## 2021-06-24 DIAGNOSIS — R1310 Dysphagia, unspecified: Secondary | ICD-10-CM | POA: Diagnosis not present

## 2021-06-24 DIAGNOSIS — R1312 Dysphagia, oropharyngeal phase: Secondary | ICD-10-CM | POA: Diagnosis not present

## 2021-06-24 DIAGNOSIS — I639 Cerebral infarction, unspecified: Secondary | ICD-10-CM | POA: Diagnosis not present

## 2021-06-24 DIAGNOSIS — D72829 Elevated white blood cell count, unspecified: Secondary | ICD-10-CM | POA: Diagnosis not present

## 2021-06-24 DIAGNOSIS — I69398 Other sequelae of cerebral infarction: Secondary | ICD-10-CM | POA: Diagnosis not present

## 2021-06-24 DIAGNOSIS — I6381 Other cerebral infarction due to occlusion or stenosis of small artery: Secondary | ICD-10-CM | POA: Diagnosis not present

## 2021-06-24 DIAGNOSIS — E43 Unspecified severe protein-calorie malnutrition: Secondary | ICD-10-CM | POA: Diagnosis not present

## 2021-06-24 DIAGNOSIS — I959 Hypotension, unspecified: Secondary | ICD-10-CM | POA: Diagnosis not present

## 2021-06-24 DIAGNOSIS — M6281 Muscle weakness (generalized): Secondary | ICD-10-CM | POA: Diagnosis not present

## 2021-06-24 DIAGNOSIS — Z931 Gastrostomy status: Secondary | ICD-10-CM | POA: Diagnosis not present

## 2021-06-24 DIAGNOSIS — R2681 Unsteadiness on feet: Secondary | ICD-10-CM | POA: Diagnosis not present

## 2021-06-24 DIAGNOSIS — I69391 Dysphagia following cerebral infarction: Secondary | ICD-10-CM | POA: Diagnosis not present

## 2021-06-24 DIAGNOSIS — F32A Depression, unspecified: Secondary | ICD-10-CM | POA: Diagnosis not present

## 2021-06-24 DIAGNOSIS — R4701 Aphasia: Secondary | ICD-10-CM | POA: Diagnosis not present

## 2021-06-24 DIAGNOSIS — R41841 Cognitive communication deficit: Secondary | ICD-10-CM | POA: Diagnosis not present

## 2021-06-24 DIAGNOSIS — G9341 Metabolic encephalopathy: Secondary | ICD-10-CM | POA: Diagnosis not present

## 2021-06-24 DIAGNOSIS — R0602 Shortness of breath: Secondary | ICD-10-CM | POA: Diagnosis not present

## 2021-06-24 DIAGNOSIS — Z7401 Bed confinement status: Secondary | ICD-10-CM | POA: Diagnosis not present

## 2021-06-24 DIAGNOSIS — I69351 Hemiplegia and hemiparesis following cerebral infarction affecting right dominant side: Secondary | ICD-10-CM | POA: Diagnosis not present

## 2021-06-24 DIAGNOSIS — Z87440 Personal history of urinary (tract) infections: Secondary | ICD-10-CM | POA: Diagnosis not present

## 2021-06-24 DIAGNOSIS — R6510 Systemic inflammatory response syndrome (SIRS) of non-infectious origin without acute organ dysfunction: Secondary | ICD-10-CM | POA: Diagnosis not present

## 2021-06-24 DIAGNOSIS — I6389 Other cerebral infarction: Secondary | ICD-10-CM | POA: Diagnosis not present

## 2021-06-24 DIAGNOSIS — Z4682 Encounter for fitting and adjustment of non-vascular catheter: Secondary | ICD-10-CM | POA: Diagnosis not present

## 2021-06-24 DIAGNOSIS — E1140 Type 2 diabetes mellitus with diabetic neuropathy, unspecified: Secondary | ICD-10-CM | POA: Diagnosis not present

## 2021-06-24 DIAGNOSIS — D649 Anemia, unspecified: Secondary | ICD-10-CM | POA: Diagnosis not present

## 2021-06-24 DIAGNOSIS — E119 Type 2 diabetes mellitus without complications: Secondary | ICD-10-CM | POA: Diagnosis not present

## 2021-06-24 DIAGNOSIS — I10 Essential (primary) hypertension: Secondary | ICD-10-CM | POA: Diagnosis not present

## 2021-06-24 DIAGNOSIS — Z85118 Personal history of other malignant neoplasm of bronchus and lung: Secondary | ICD-10-CM | POA: Diagnosis not present

## 2021-06-25 DIAGNOSIS — I69351 Hemiplegia and hemiparesis following cerebral infarction affecting right dominant side: Secondary | ICD-10-CM | POA: Diagnosis not present

## 2021-06-25 DIAGNOSIS — A419 Sepsis, unspecified organism: Secondary | ICD-10-CM | POA: Diagnosis not present

## 2021-06-25 DIAGNOSIS — R918 Other nonspecific abnormal finding of lung field: Secondary | ICD-10-CM | POA: Diagnosis not present

## 2021-06-25 DIAGNOSIS — Z4682 Encounter for fitting and adjustment of non-vascular catheter: Secondary | ICD-10-CM | POA: Diagnosis not present

## 2021-06-25 DIAGNOSIS — R1310 Dysphagia, unspecified: Secondary | ICD-10-CM | POA: Diagnosis not present

## 2021-06-25 DIAGNOSIS — J690 Pneumonitis due to inhalation of food and vomit: Secondary | ICD-10-CM | POA: Diagnosis not present

## 2021-06-25 DIAGNOSIS — I422 Other hypertrophic cardiomyopathy: Secondary | ICD-10-CM | POA: Diagnosis not present

## 2021-06-25 DIAGNOSIS — I63319 Cerebral infarction due to thrombosis of unspecified middle cerebral artery: Secondary | ICD-10-CM | POA: Diagnosis not present

## 2021-06-25 DIAGNOSIS — I639 Cerebral infarction, unspecified: Secondary | ICD-10-CM | POA: Diagnosis not present

## 2021-06-25 DIAGNOSIS — R4701 Aphasia: Secondary | ICD-10-CM | POA: Diagnosis not present

## 2021-06-26 DIAGNOSIS — I69351 Hemiplegia and hemiparesis following cerebral infarction affecting right dominant side: Secondary | ICD-10-CM | POA: Diagnosis not present

## 2021-06-26 DIAGNOSIS — I63319 Cerebral infarction due to thrombosis of unspecified middle cerebral artery: Secondary | ICD-10-CM | POA: Diagnosis not present

## 2021-06-26 DIAGNOSIS — R4701 Aphasia: Secondary | ICD-10-CM | POA: Diagnosis not present

## 2021-06-26 DIAGNOSIS — I6389 Other cerebral infarction: Secondary | ICD-10-CM | POA: Diagnosis not present

## 2021-06-26 DIAGNOSIS — I422 Other hypertrophic cardiomyopathy: Secondary | ICD-10-CM | POA: Diagnosis not present

## 2021-06-26 DIAGNOSIS — R1310 Dysphagia, unspecified: Secondary | ICD-10-CM | POA: Diagnosis not present

## 2021-06-26 DIAGNOSIS — J690 Pneumonitis due to inhalation of food and vomit: Secondary | ICD-10-CM | POA: Diagnosis not present

## 2021-06-26 DIAGNOSIS — A419 Sepsis, unspecified organism: Secondary | ICD-10-CM | POA: Diagnosis not present

## 2021-06-26 DIAGNOSIS — I639 Cerebral infarction, unspecified: Secondary | ICD-10-CM | POA: Diagnosis not present

## 2021-06-27 DIAGNOSIS — I69391 Dysphagia following cerebral infarction: Secondary | ICD-10-CM | POA: Diagnosis not present

## 2021-06-27 DIAGNOSIS — I422 Other hypertrophic cardiomyopathy: Secondary | ICD-10-CM | POA: Diagnosis not present

## 2021-06-27 DIAGNOSIS — I639 Cerebral infarction, unspecified: Secondary | ICD-10-CM | POA: Diagnosis not present

## 2021-06-27 DIAGNOSIS — J690 Pneumonitis due to inhalation of food and vomit: Secondary | ICD-10-CM | POA: Diagnosis not present

## 2021-06-27 DIAGNOSIS — E43 Unspecified severe protein-calorie malnutrition: Secondary | ICD-10-CM | POA: Diagnosis not present

## 2021-06-27 DIAGNOSIS — I63319 Cerebral infarction due to thrombosis of unspecified middle cerebral artery: Secondary | ICD-10-CM | POA: Diagnosis not present

## 2021-06-27 DIAGNOSIS — A419 Sepsis, unspecified organism: Secondary | ICD-10-CM | POA: Diagnosis not present

## 2021-06-27 DIAGNOSIS — R1310 Dysphagia, unspecified: Secondary | ICD-10-CM | POA: Diagnosis not present

## 2021-06-27 DIAGNOSIS — I69351 Hemiplegia and hemiparesis following cerebral infarction affecting right dominant side: Secondary | ICD-10-CM | POA: Diagnosis not present

## 2021-06-27 DIAGNOSIS — R4701 Aphasia: Secondary | ICD-10-CM | POA: Diagnosis not present

## 2021-06-28 DIAGNOSIS — A419 Sepsis, unspecified organism: Secondary | ICD-10-CM | POA: Diagnosis not present

## 2021-06-28 DIAGNOSIS — I69351 Hemiplegia and hemiparesis following cerebral infarction affecting right dominant side: Secondary | ICD-10-CM | POA: Diagnosis not present

## 2021-06-28 DIAGNOSIS — I63319 Cerebral infarction due to thrombosis of unspecified middle cerebral artery: Secondary | ICD-10-CM | POA: Diagnosis not present

## 2021-06-28 DIAGNOSIS — E43 Unspecified severe protein-calorie malnutrition: Secondary | ICD-10-CM | POA: Diagnosis not present

## 2021-06-28 DIAGNOSIS — R1310 Dysphagia, unspecified: Secondary | ICD-10-CM | POA: Diagnosis not present

## 2021-06-28 DIAGNOSIS — E119 Type 2 diabetes mellitus without complications: Secondary | ICD-10-CM | POA: Diagnosis not present

## 2021-06-28 DIAGNOSIS — J690 Pneumonitis due to inhalation of food and vomit: Secondary | ICD-10-CM | POA: Diagnosis not present

## 2021-06-28 DIAGNOSIS — I422 Other hypertrophic cardiomyopathy: Secondary | ICD-10-CM | POA: Diagnosis not present

## 2021-06-28 DIAGNOSIS — I639 Cerebral infarction, unspecified: Secondary | ICD-10-CM | POA: Diagnosis not present

## 2021-06-28 DIAGNOSIS — R4701 Aphasia: Secondary | ICD-10-CM | POA: Diagnosis not present

## 2021-06-28 DIAGNOSIS — I10 Essential (primary) hypertension: Secondary | ICD-10-CM | POA: Diagnosis not present

## 2021-06-28 DIAGNOSIS — I69391 Dysphagia following cerebral infarction: Secondary | ICD-10-CM | POA: Diagnosis not present

## 2021-06-29 ENCOUNTER — Other Ambulatory Visit: Payer: Self-pay | Admitting: *Deleted

## 2021-06-29 DIAGNOSIS — J690 Pneumonitis due to inhalation of food and vomit: Secondary | ICD-10-CM | POA: Diagnosis not present

## 2021-06-29 DIAGNOSIS — I422 Other hypertrophic cardiomyopathy: Secondary | ICD-10-CM | POA: Diagnosis not present

## 2021-06-29 DIAGNOSIS — R1310 Dysphagia, unspecified: Secondary | ICD-10-CM | POA: Diagnosis not present

## 2021-06-29 DIAGNOSIS — R4701 Aphasia: Secondary | ICD-10-CM | POA: Diagnosis not present

## 2021-06-29 DIAGNOSIS — I69351 Hemiplegia and hemiparesis following cerebral infarction affecting right dominant side: Secondary | ICD-10-CM | POA: Diagnosis not present

## 2021-06-29 DIAGNOSIS — I639 Cerebral infarction, unspecified: Secondary | ICD-10-CM | POA: Diagnosis not present

## 2021-06-29 DIAGNOSIS — I63319 Cerebral infarction due to thrombosis of unspecified middle cerebral artery: Secondary | ICD-10-CM | POA: Diagnosis not present

## 2021-06-29 NOTE — Patient Outreach (Signed)
[place] [facility]) Care Management  06/29/2021  Crystal Bryant 27-Oct-1970 [id number]  Telephone outreach to [name], Crystal Bryant, per mother's request to check in with her in 2 weeks. Call was not answered but left a message to return call.  [name]. Myrtie Neither, MSN, GNP-BC Gerontological Nurse Practitioner [facility] Care Management [phone]  [name] returned my call. She reports her daughter is still in the hospital  (post multiple CVAs) but has been moved from the rehab unit. They are applying for Medicaid for LTC. [name] wants to move her daughter to a facility in [place] near where she resides. She has provided some names to the SW but they have not been approved since her Medicaid application is pending. She is wondering who may pay for the transportation from [place] to [place]? NP offered to forward this question to [facility] for some assistance in answering this question.   NP will call [name] in 2 weeks for support and progress update.  [name]. Myrtie Neither, MSN, [facility] Gerontological Nurse Practitioner [facility] Care Management [phone]

## 2021-06-30 DIAGNOSIS — J690 Pneumonitis due to inhalation of food and vomit: Secondary | ICD-10-CM | POA: Diagnosis not present

## 2021-06-30 DIAGNOSIS — I63319 Cerebral infarction due to thrombosis of unspecified middle cerebral artery: Secondary | ICD-10-CM | POA: Diagnosis not present

## 2021-06-30 DIAGNOSIS — I639 Cerebral infarction, unspecified: Secondary | ICD-10-CM | POA: Diagnosis not present

## 2021-06-30 DIAGNOSIS — I69351 Hemiplegia and hemiparesis following cerebral infarction affecting right dominant side: Secondary | ICD-10-CM | POA: Diagnosis not present

## 2021-06-30 DIAGNOSIS — I422 Other hypertrophic cardiomyopathy: Secondary | ICD-10-CM | POA: Diagnosis not present

## 2021-06-30 DIAGNOSIS — R1310 Dysphagia, unspecified: Secondary | ICD-10-CM | POA: Diagnosis not present

## 2021-06-30 DIAGNOSIS — R4701 Aphasia: Secondary | ICD-10-CM | POA: Diagnosis not present

## 2021-07-01 DIAGNOSIS — J690 Pneumonitis due to inhalation of food and vomit: Secondary | ICD-10-CM | POA: Diagnosis not present

## 2021-07-01 DIAGNOSIS — I422 Other hypertrophic cardiomyopathy: Secondary | ICD-10-CM | POA: Diagnosis not present

## 2021-07-01 DIAGNOSIS — I63319 Cerebral infarction due to thrombosis of unspecified middle cerebral artery: Secondary | ICD-10-CM | POA: Diagnosis not present

## 2021-07-01 DIAGNOSIS — I639 Cerebral infarction, unspecified: Secondary | ICD-10-CM | POA: Diagnosis not present

## 2021-07-01 DIAGNOSIS — R4701 Aphasia: Secondary | ICD-10-CM | POA: Diagnosis not present

## 2021-07-01 DIAGNOSIS — R0602 Shortness of breath: Secondary | ICD-10-CM | POA: Diagnosis not present

## 2021-07-01 DIAGNOSIS — I69351 Hemiplegia and hemiparesis following cerebral infarction affecting right dominant side: Secondary | ICD-10-CM | POA: Diagnosis not present

## 2021-07-01 DIAGNOSIS — R1310 Dysphagia, unspecified: Secondary | ICD-10-CM | POA: Diagnosis not present

## 2021-07-02 DIAGNOSIS — I63319 Cerebral infarction due to thrombosis of unspecified middle cerebral artery: Secondary | ICD-10-CM | POA: Diagnosis not present

## 2021-07-02 DIAGNOSIS — I422 Other hypertrophic cardiomyopathy: Secondary | ICD-10-CM | POA: Diagnosis not present

## 2021-07-02 DIAGNOSIS — R1310 Dysphagia, unspecified: Secondary | ICD-10-CM | POA: Diagnosis not present

## 2021-07-02 DIAGNOSIS — I69351 Hemiplegia and hemiparesis following cerebral infarction affecting right dominant side: Secondary | ICD-10-CM | POA: Diagnosis not present

## 2021-07-02 DIAGNOSIS — R4701 Aphasia: Secondary | ICD-10-CM | POA: Diagnosis not present

## 2021-07-02 DIAGNOSIS — I639 Cerebral infarction, unspecified: Secondary | ICD-10-CM | POA: Diagnosis not present

## 2021-07-02 DIAGNOSIS — J690 Pneumonitis due to inhalation of food and vomit: Secondary | ICD-10-CM | POA: Diagnosis not present

## 2021-07-03 DIAGNOSIS — R1310 Dysphagia, unspecified: Secondary | ICD-10-CM | POA: Diagnosis not present

## 2021-07-03 DIAGNOSIS — I63319 Cerebral infarction due to thrombosis of unspecified middle cerebral artery: Secondary | ICD-10-CM | POA: Diagnosis not present

## 2021-07-03 DIAGNOSIS — I639 Cerebral infarction, unspecified: Secondary | ICD-10-CM | POA: Diagnosis not present

## 2021-07-03 DIAGNOSIS — R4701 Aphasia: Secondary | ICD-10-CM | POA: Diagnosis not present

## 2021-07-03 DIAGNOSIS — M545 Low back pain, unspecified: Secondary | ICD-10-CM | POA: Diagnosis not present

## 2021-07-03 DIAGNOSIS — R4182 Altered mental status, unspecified: Secondary | ICD-10-CM | POA: Diagnosis not present

## 2021-07-03 DIAGNOSIS — I422 Other hypertrophic cardiomyopathy: Secondary | ICD-10-CM | POA: Diagnosis not present

## 2021-07-03 DIAGNOSIS — I69351 Hemiplegia and hemiparesis following cerebral infarction affecting right dominant side: Secondary | ICD-10-CM | POA: Diagnosis not present

## 2021-07-03 DIAGNOSIS — J690 Pneumonitis due to inhalation of food and vomit: Secondary | ICD-10-CM | POA: Diagnosis not present

## 2021-07-04 DIAGNOSIS — I63319 Cerebral infarction due to thrombosis of unspecified middle cerebral artery: Secondary | ICD-10-CM | POA: Diagnosis not present

## 2021-07-04 DIAGNOSIS — R1310 Dysphagia, unspecified: Secondary | ICD-10-CM | POA: Diagnosis not present

## 2021-07-04 DIAGNOSIS — I69351 Hemiplegia and hemiparesis following cerebral infarction affecting right dominant side: Secondary | ICD-10-CM | POA: Diagnosis not present

## 2021-07-04 DIAGNOSIS — I422 Other hypertrophic cardiomyopathy: Secondary | ICD-10-CM | POA: Diagnosis not present

## 2021-07-04 DIAGNOSIS — R4701 Aphasia: Secondary | ICD-10-CM | POA: Diagnosis not present

## 2021-07-04 DIAGNOSIS — J690 Pneumonitis due to inhalation of food and vomit: Secondary | ICD-10-CM | POA: Diagnosis not present

## 2021-07-04 DIAGNOSIS — I639 Cerebral infarction, unspecified: Secondary | ICD-10-CM | POA: Diagnosis not present

## 2021-07-05 DIAGNOSIS — I639 Cerebral infarction, unspecified: Secondary | ICD-10-CM | POA: Diagnosis not present

## 2021-07-05 DIAGNOSIS — R4701 Aphasia: Secondary | ICD-10-CM | POA: Diagnosis not present

## 2021-07-05 DIAGNOSIS — I422 Other hypertrophic cardiomyopathy: Secondary | ICD-10-CM | POA: Diagnosis not present

## 2021-07-05 DIAGNOSIS — I63319 Cerebral infarction due to thrombosis of unspecified middle cerebral artery: Secondary | ICD-10-CM | POA: Diagnosis not present

## 2021-07-05 DIAGNOSIS — J690 Pneumonitis due to inhalation of food and vomit: Secondary | ICD-10-CM | POA: Diagnosis not present

## 2021-07-05 DIAGNOSIS — R1310 Dysphagia, unspecified: Secondary | ICD-10-CM | POA: Diagnosis not present

## 2021-07-05 DIAGNOSIS — I69351 Hemiplegia and hemiparesis following cerebral infarction affecting right dominant side: Secondary | ICD-10-CM | POA: Diagnosis not present

## 2021-07-06 DIAGNOSIS — R4701 Aphasia: Secondary | ICD-10-CM | POA: Diagnosis not present

## 2021-07-06 DIAGNOSIS — I69351 Hemiplegia and hemiparesis following cerebral infarction affecting right dominant side: Secondary | ICD-10-CM | POA: Diagnosis not present

## 2021-07-06 DIAGNOSIS — R1310 Dysphagia, unspecified: Secondary | ICD-10-CM | POA: Diagnosis not present

## 2021-07-06 DIAGNOSIS — I422 Other hypertrophic cardiomyopathy: Secondary | ICD-10-CM | POA: Diagnosis not present

## 2021-07-06 DIAGNOSIS — I639 Cerebral infarction, unspecified: Secondary | ICD-10-CM | POA: Diagnosis not present

## 2021-07-06 DIAGNOSIS — I63319 Cerebral infarction due to thrombosis of unspecified middle cerebral artery: Secondary | ICD-10-CM | POA: Diagnosis not present

## 2021-07-06 DIAGNOSIS — J690 Pneumonitis due to inhalation of food and vomit: Secondary | ICD-10-CM | POA: Diagnosis not present

## 2021-07-07 DIAGNOSIS — Z931 Gastrostomy status: Secondary | ICD-10-CM | POA: Diagnosis not present

## 2021-07-07 DIAGNOSIS — I6381 Other cerebral infarction due to occlusion or stenosis of small artery: Secondary | ICD-10-CM | POA: Diagnosis not present

## 2021-07-07 DIAGNOSIS — J690 Pneumonitis due to inhalation of food and vomit: Secondary | ICD-10-CM | POA: Diagnosis not present

## 2021-07-07 DIAGNOSIS — L89610 Pressure ulcer of right heel, unstageable: Secondary | ICD-10-CM | POA: Diagnosis not present

## 2021-07-07 DIAGNOSIS — I959 Hypotension, unspecified: Secondary | ICD-10-CM | POA: Diagnosis not present

## 2021-07-07 DIAGNOSIS — R2681 Unsteadiness on feet: Secondary | ICD-10-CM | POA: Diagnosis not present

## 2021-07-07 DIAGNOSIS — I422 Other hypertrophic cardiomyopathy: Secondary | ICD-10-CM | POA: Diagnosis not present

## 2021-07-07 DIAGNOSIS — R0981 Nasal congestion: Secondary | ICD-10-CM | POA: Diagnosis not present

## 2021-07-07 DIAGNOSIS — I10 Essential (primary) hypertension: Secondary | ICD-10-CM | POA: Diagnosis not present

## 2021-07-07 DIAGNOSIS — L89313 Pressure ulcer of right buttock, stage 3: Secondary | ICD-10-CM | POA: Diagnosis not present

## 2021-07-07 DIAGNOSIS — R532 Functional quadriplegia: Secondary | ICD-10-CM | POA: Diagnosis not present

## 2021-07-07 DIAGNOSIS — R1310 Dysphagia, unspecified: Secondary | ICD-10-CM | POA: Diagnosis not present

## 2021-07-07 DIAGNOSIS — N390 Urinary tract infection, site not specified: Secondary | ICD-10-CM | POA: Diagnosis not present

## 2021-07-07 DIAGNOSIS — N898 Other specified noninflammatory disorders of vagina: Secondary | ICD-10-CM | POA: Diagnosis not present

## 2021-07-07 DIAGNOSIS — M6281 Muscle weakness (generalized): Secondary | ICD-10-CM | POA: Diagnosis not present

## 2021-07-07 DIAGNOSIS — R1312 Dysphagia, oropharyngeal phase: Secondary | ICD-10-CM | POA: Diagnosis not present

## 2021-07-07 DIAGNOSIS — M792 Neuralgia and neuritis, unspecified: Secondary | ICD-10-CM | POA: Diagnosis not present

## 2021-07-07 DIAGNOSIS — L89616 Pressure-induced deep tissue damage of right heel: Secondary | ICD-10-CM | POA: Diagnosis not present

## 2021-07-07 DIAGNOSIS — L89152 Pressure ulcer of sacral region, stage 2: Secondary | ICD-10-CM | POA: Diagnosis not present

## 2021-07-07 DIAGNOSIS — R627 Adult failure to thrive: Secondary | ICD-10-CM | POA: Diagnosis not present

## 2021-07-07 DIAGNOSIS — R4701 Aphasia: Secondary | ICD-10-CM | POA: Diagnosis not present

## 2021-07-07 DIAGNOSIS — E1140 Type 2 diabetes mellitus with diabetic neuropathy, unspecified: Secondary | ICD-10-CM | POA: Diagnosis not present

## 2021-07-07 DIAGNOSIS — Z48815 Encounter for surgical aftercare following surgery on the digestive system: Secondary | ICD-10-CM | POA: Diagnosis not present

## 2021-07-07 DIAGNOSIS — I69391 Dysphagia following cerebral infarction: Secondary | ICD-10-CM | POA: Diagnosis not present

## 2021-07-07 DIAGNOSIS — Z7401 Bed confinement status: Secondary | ICD-10-CM | POA: Diagnosis not present

## 2021-07-07 DIAGNOSIS — L89312 Pressure ulcer of right buttock, stage 2: Secondary | ICD-10-CM | POA: Diagnosis not present

## 2021-07-07 DIAGNOSIS — F32A Depression, unspecified: Secondary | ICD-10-CM | POA: Diagnosis not present

## 2021-07-07 DIAGNOSIS — I69398 Other sequelae of cerebral infarction: Secondary | ICD-10-CM | POA: Diagnosis not present

## 2021-07-07 DIAGNOSIS — D509 Iron deficiency anemia, unspecified: Secondary | ICD-10-CM | POA: Diagnosis not present

## 2021-07-07 DIAGNOSIS — Z87440 Personal history of urinary (tract) infections: Secondary | ICD-10-CM | POA: Diagnosis not present

## 2021-07-07 DIAGNOSIS — R112 Nausea with vomiting, unspecified: Secondary | ICD-10-CM | POA: Diagnosis not present

## 2021-07-07 DIAGNOSIS — I69351 Hemiplegia and hemiparesis following cerebral infarction affecting right dominant side: Secondary | ICD-10-CM | POA: Diagnosis not present

## 2021-07-07 DIAGNOSIS — Z85118 Personal history of other malignant neoplasm of bronchus and lung: Secondary | ICD-10-CM | POA: Diagnosis not present

## 2021-07-07 DIAGNOSIS — I639 Cerebral infarction, unspecified: Secondary | ICD-10-CM | POA: Diagnosis not present

## 2021-07-07 DIAGNOSIS — E119 Type 2 diabetes mellitus without complications: Secondary | ICD-10-CM | POA: Diagnosis not present

## 2021-07-07 DIAGNOSIS — R8290 Unspecified abnormal findings in urine: Secondary | ICD-10-CM | POA: Diagnosis not present

## 2021-07-07 DIAGNOSIS — L22 Diaper dermatitis: Secondary | ICD-10-CM | POA: Diagnosis not present

## 2021-07-07 DIAGNOSIS — E43 Unspecified severe protein-calorie malnutrition: Secondary | ICD-10-CM | POA: Diagnosis not present

## 2021-07-07 DIAGNOSIS — R41841 Cognitive communication deficit: Secondary | ICD-10-CM | POA: Diagnosis not present

## 2021-07-07 DIAGNOSIS — I63319 Cerebral infarction due to thrombosis of unspecified middle cerebral artery: Secondary | ICD-10-CM | POA: Diagnosis not present

## 2021-07-11 DIAGNOSIS — I6381 Other cerebral infarction due to occlusion or stenosis of small artery: Secondary | ICD-10-CM | POA: Diagnosis not present

## 2021-07-11 DIAGNOSIS — I69351 Hemiplegia and hemiparesis following cerebral infarction affecting right dominant side: Secondary | ICD-10-CM | POA: Diagnosis not present

## 2021-07-11 DIAGNOSIS — E1140 Type 2 diabetes mellitus with diabetic neuropathy, unspecified: Secondary | ICD-10-CM | POA: Diagnosis not present

## 2021-07-11 DIAGNOSIS — I10 Essential (primary) hypertension: Secondary | ICD-10-CM | POA: Diagnosis not present

## 2021-07-12 DIAGNOSIS — E1140 Type 2 diabetes mellitus with diabetic neuropathy, unspecified: Secondary | ICD-10-CM | POA: Diagnosis not present

## 2021-07-12 DIAGNOSIS — I6381 Other cerebral infarction due to occlusion or stenosis of small artery: Secondary | ICD-10-CM | POA: Diagnosis not present

## 2021-07-12 DIAGNOSIS — I69351 Hemiplegia and hemiparesis following cerebral infarction affecting right dominant side: Secondary | ICD-10-CM | POA: Diagnosis not present

## 2021-07-12 DIAGNOSIS — I10 Essential (primary) hypertension: Secondary | ICD-10-CM | POA: Diagnosis not present

## 2021-07-13 ENCOUNTER — Other Ambulatory Visit: Payer: Self-pay | Admitting: *Deleted

## 2021-07-13 NOTE — Patient Outreach (Signed)
[place] [facility]) Care Management  07/13/2021  Crystal Bryant 03-Aug-1970 [id number]  Telephone outreach to Ms. Washinigton's mother, [name]. She reports her daughter has been moved to an SNF. It is not the one she desires her to be in and she has started the process to get her moved. She also was thinking of getting here moved to [place] or [place] closer [place].  Her granddaughter or pt's daughter, [name], is now handling all the medical arrangements and planning for her care. [name] denies any new issues or requests. Advised she can call me or Benefits for any future needs.  [name]. Myrtie Neither, MSN, [facility] Gerontological Nurse Practitioner [facility] Care Management [phone]

## 2021-07-14 DIAGNOSIS — L89610 Pressure ulcer of right heel, unstageable: Secondary | ICD-10-CM | POA: Diagnosis not present

## 2021-07-19 DIAGNOSIS — L89312 Pressure ulcer of right buttock, stage 2: Secondary | ICD-10-CM | POA: Diagnosis not present

## 2021-07-26 DIAGNOSIS — L89610 Pressure ulcer of right heel, unstageable: Secondary | ICD-10-CM | POA: Diagnosis not present

## 2021-08-02 DIAGNOSIS — L89152 Pressure ulcer of sacral region, stage 2: Secondary | ICD-10-CM | POA: Diagnosis not present

## 2021-08-02 DIAGNOSIS — E1140 Type 2 diabetes mellitus with diabetic neuropathy, unspecified: Secondary | ICD-10-CM | POA: Diagnosis not present

## 2021-08-02 DIAGNOSIS — L89313 Pressure ulcer of right buttock, stage 3: Secondary | ICD-10-CM | POA: Diagnosis not present

## 2021-08-02 DIAGNOSIS — D509 Iron deficiency anemia, unspecified: Secondary | ICD-10-CM | POA: Diagnosis not present

## 2021-08-04 DIAGNOSIS — M792 Neuralgia and neuritis, unspecified: Secondary | ICD-10-CM | POA: Diagnosis not present

## 2021-08-07 DIAGNOSIS — E1140 Type 2 diabetes mellitus with diabetic neuropathy, unspecified: Secondary | ICD-10-CM | POA: Diagnosis not present

## 2021-08-07 DIAGNOSIS — I69351 Hemiplegia and hemiparesis following cerebral infarction affecting right dominant side: Secondary | ICD-10-CM | POA: Diagnosis not present

## 2021-08-07 DIAGNOSIS — R8290 Unspecified abnormal findings in urine: Secondary | ICD-10-CM | POA: Diagnosis not present

## 2021-08-07 DIAGNOSIS — I10 Essential (primary) hypertension: Secondary | ICD-10-CM | POA: Diagnosis not present

## 2021-08-09 DIAGNOSIS — L22 Diaper dermatitis: Secondary | ICD-10-CM | POA: Diagnosis not present

## 2021-08-14 DIAGNOSIS — I69351 Hemiplegia and hemiparesis following cerebral infarction affecting right dominant side: Secondary | ICD-10-CM | POA: Diagnosis not present

## 2021-08-16 DIAGNOSIS — I69351 Hemiplegia and hemiparesis following cerebral infarction affecting right dominant side: Secondary | ICD-10-CM | POA: Diagnosis not present

## 2021-08-16 DIAGNOSIS — R1312 Dysphagia, oropharyngeal phase: Secondary | ICD-10-CM | POA: Diagnosis not present

## 2021-08-16 DIAGNOSIS — R0981 Nasal congestion: Secondary | ICD-10-CM | POA: Diagnosis not present

## 2021-08-17 DIAGNOSIS — I639 Cerebral infarction, unspecified: Secondary | ICD-10-CM | POA: Diagnosis not present

## 2021-08-17 DIAGNOSIS — I69391 Dysphagia following cerebral infarction: Secondary | ICD-10-CM | POA: Diagnosis not present

## 2021-08-17 DIAGNOSIS — I69351 Hemiplegia and hemiparesis following cerebral infarction affecting right dominant side: Secondary | ICD-10-CM | POA: Diagnosis not present

## 2021-08-17 DIAGNOSIS — I10 Essential (primary) hypertension: Secondary | ICD-10-CM | POA: Diagnosis not present

## 2021-08-22 DIAGNOSIS — I69351 Hemiplegia and hemiparesis following cerebral infarction affecting right dominant side: Secondary | ICD-10-CM | POA: Diagnosis not present

## 2021-08-22 DIAGNOSIS — R1312 Dysphagia, oropharyngeal phase: Secondary | ICD-10-CM | POA: Diagnosis not present

## 2021-08-22 DIAGNOSIS — E1140 Type 2 diabetes mellitus with diabetic neuropathy, unspecified: Secondary | ICD-10-CM | POA: Diagnosis not present

## 2021-08-22 DIAGNOSIS — I10 Essential (primary) hypertension: Secondary | ICD-10-CM | POA: Diagnosis not present

## 2021-08-24 DIAGNOSIS — N390 Urinary tract infection, site not specified: Secondary | ICD-10-CM | POA: Diagnosis not present

## 2021-08-24 DIAGNOSIS — N898 Other specified noninflammatory disorders of vagina: Secondary | ICD-10-CM | POA: Diagnosis not present

## 2021-08-24 DIAGNOSIS — R1312 Dysphagia, oropharyngeal phase: Secondary | ICD-10-CM | POA: Diagnosis not present

## 2021-08-28 DIAGNOSIS — N898 Other specified noninflammatory disorders of vagina: Secondary | ICD-10-CM | POA: Diagnosis not present

## 2021-08-28 DIAGNOSIS — R1312 Dysphagia, oropharyngeal phase: Secondary | ICD-10-CM | POA: Diagnosis not present

## 2021-08-28 DIAGNOSIS — N390 Urinary tract infection, site not specified: Secondary | ICD-10-CM | POA: Diagnosis not present

## 2021-08-31 DIAGNOSIS — N390 Urinary tract infection, site not specified: Secondary | ICD-10-CM | POA: Diagnosis not present

## 2021-08-31 DIAGNOSIS — R112 Nausea with vomiting, unspecified: Secondary | ICD-10-CM | POA: Diagnosis not present

## 2021-09-04 DIAGNOSIS — I69351 Hemiplegia and hemiparesis following cerebral infarction affecting right dominant side: Secondary | ICD-10-CM | POA: Diagnosis not present

## 2021-09-04 DIAGNOSIS — N390 Urinary tract infection, site not specified: Secondary | ICD-10-CM | POA: Diagnosis not present

## 2021-09-04 DIAGNOSIS — R1312 Dysphagia, oropharyngeal phase: Secondary | ICD-10-CM | POA: Diagnosis not present

## 2021-09-04 DIAGNOSIS — R112 Nausea with vomiting, unspecified: Secondary | ICD-10-CM | POA: Diagnosis not present

## 2021-09-04 DIAGNOSIS — I10 Essential (primary) hypertension: Secondary | ICD-10-CM | POA: Diagnosis not present

## 2021-09-05 DIAGNOSIS — G9341 Metabolic encephalopathy: Secondary | ICD-10-CM | POA: Diagnosis not present

## 2021-09-05 DIAGNOSIS — N390 Urinary tract infection, site not specified: Secondary | ICD-10-CM | POA: Diagnosis not present

## 2021-09-05 DIAGNOSIS — R112 Nausea with vomiting, unspecified: Secondary | ICD-10-CM | POA: Diagnosis not present

## 2021-09-05 DIAGNOSIS — R1312 Dysphagia, oropharyngeal phase: Secondary | ICD-10-CM | POA: Diagnosis not present

## 2021-09-05 DIAGNOSIS — I10 Essential (primary) hypertension: Secondary | ICD-10-CM | POA: Diagnosis not present

## 2021-09-05 DIAGNOSIS — E119 Type 2 diabetes mellitus without complications: Secondary | ICD-10-CM | POA: Diagnosis not present

## 2021-09-06 DIAGNOSIS — R112 Nausea with vomiting, unspecified: Secondary | ICD-10-CM | POA: Diagnosis not present

## 2021-09-07 DIAGNOSIS — R112 Nausea with vomiting, unspecified: Secondary | ICD-10-CM | POA: Diagnosis not present

## 2021-09-07 DIAGNOSIS — N390 Urinary tract infection, site not specified: Secondary | ICD-10-CM | POA: Diagnosis not present

## 2021-09-07 DIAGNOSIS — G9341 Metabolic encephalopathy: Secondary | ICD-10-CM | POA: Diagnosis not present

## 2021-09-07 DIAGNOSIS — R1312 Dysphagia, oropharyngeal phase: Secondary | ICD-10-CM | POA: Diagnosis not present

## 2021-09-08 DIAGNOSIS — E119 Type 2 diabetes mellitus without complications: Secondary | ICD-10-CM | POA: Diagnosis not present

## 2021-09-08 DIAGNOSIS — N390 Urinary tract infection, site not specified: Secondary | ICD-10-CM | POA: Diagnosis not present

## 2021-09-08 DIAGNOSIS — R112 Nausea with vomiting, unspecified: Secondary | ICD-10-CM | POA: Diagnosis not present

## 2021-09-08 DIAGNOSIS — I10 Essential (primary) hypertension: Secondary | ICD-10-CM | POA: Diagnosis not present

## 2021-09-08 DIAGNOSIS — G9341 Metabolic encephalopathy: Secondary | ICD-10-CM | POA: Diagnosis not present

## 2021-09-09 DIAGNOSIS — D649 Anemia, unspecified: Secondary | ICD-10-CM | POA: Diagnosis not present

## 2021-09-11 DIAGNOSIS — E861 Hypovolemia: Secondary | ICD-10-CM | POA: Diagnosis not present

## 2021-09-11 DIAGNOSIS — G9341 Metabolic encephalopathy: Secondary | ICD-10-CM | POA: Diagnosis not present

## 2021-09-11 DIAGNOSIS — R197 Diarrhea, unspecified: Secondary | ICD-10-CM | POA: Diagnosis not present

## 2021-09-11 DIAGNOSIS — N390 Urinary tract infection, site not specified: Secondary | ICD-10-CM | POA: Diagnosis not present

## 2021-09-12 DIAGNOSIS — N390 Urinary tract infection, site not specified: Secondary | ICD-10-CM | POA: Diagnosis not present

## 2021-09-12 DIAGNOSIS — D649 Anemia, unspecified: Secondary | ICD-10-CM | POA: Diagnosis not present

## 2021-09-12 DIAGNOSIS — R197 Diarrhea, unspecified: Secondary | ICD-10-CM | POA: Diagnosis not present

## 2021-09-12 DIAGNOSIS — G9341 Metabolic encephalopathy: Secondary | ICD-10-CM | POA: Diagnosis not present

## 2021-09-15 DIAGNOSIS — E119 Type 2 diabetes mellitus without complications: Secondary | ICD-10-CM | POA: Diagnosis not present

## 2021-09-18 DIAGNOSIS — R8290 Unspecified abnormal findings in urine: Secondary | ICD-10-CM | POA: Diagnosis not present

## 2021-09-18 DIAGNOSIS — I69351 Hemiplegia and hemiparesis following cerebral infarction affecting right dominant side: Secondary | ICD-10-CM | POA: Diagnosis not present

## 2021-09-18 DIAGNOSIS — I10 Essential (primary) hypertension: Secondary | ICD-10-CM | POA: Diagnosis not present

## 2021-09-18 DIAGNOSIS — E1140 Type 2 diabetes mellitus with diabetic neuropathy, unspecified: Secondary | ICD-10-CM | POA: Diagnosis not present

## 2021-09-19 DIAGNOSIS — N390 Urinary tract infection, site not specified: Secondary | ICD-10-CM | POA: Diagnosis not present

## 2021-09-19 DIAGNOSIS — N760 Acute vaginitis: Secondary | ICD-10-CM | POA: Diagnosis not present

## 2021-09-19 DIAGNOSIS — I10 Essential (primary) hypertension: Secondary | ICD-10-CM | POA: Diagnosis not present

## 2021-09-19 DIAGNOSIS — B9689 Other specified bacterial agents as the cause of diseases classified elsewhere: Secondary | ICD-10-CM | POA: Diagnosis not present

## 2021-09-21 DIAGNOSIS — L22 Diaper dermatitis: Secondary | ICD-10-CM | POA: Diagnosis not present

## 2021-09-21 DIAGNOSIS — I69398 Other sequelae of cerebral infarction: Secondary | ICD-10-CM | POA: Diagnosis not present

## 2021-09-21 DIAGNOSIS — R3981 Functional urinary incontinence: Secondary | ICD-10-CM | POA: Diagnosis not present

## 2021-09-21 DIAGNOSIS — R159 Full incontinence of feces: Secondary | ICD-10-CM | POA: Diagnosis not present

## 2021-09-21 DIAGNOSIS — I10 Essential (primary) hypertension: Secondary | ICD-10-CM | POA: Diagnosis not present

## 2021-09-22 ENCOUNTER — Ambulatory Visit (INDEPENDENT_AMBULATORY_CARE_PROVIDER_SITE_OTHER): Payer: 59

## 2021-09-22 DIAGNOSIS — T82110D Breakdown (mechanical) of cardiac electrode, subsequent encounter: Secondary | ICD-10-CM

## 2021-09-22 DIAGNOSIS — Z95810 Presence of automatic (implantable) cardiac defibrillator: Secondary | ICD-10-CM

## 2021-09-22 DIAGNOSIS — T82110A Breakdown (mechanical) of cardiac electrode, initial encounter: Secondary | ICD-10-CM

## 2021-09-22 LAB — CUP PACEART REMOTE DEVICE CHECK
Battery Remaining Longevity: 46 mo
Battery Remaining Percentage: 54 %
Battery Voltage: 2.95 V
Brady Statistic AP VP Percent: 1 %
Brady Statistic AP VS Percent: 1 %
Brady Statistic AS VP Percent: 99 %
Brady Statistic AS VS Percent: 1 %
Brady Statistic RA Percent Paced: 1 %
Date Time Interrogation Session: 20230825091049
HighPow Impedance: 56 Ohm
HighPow Impedance: 56 Ohm
Implantable Lead Implant Date: 20201105
Implantable Lead Implant Date: 20201105
Implantable Lead Implant Date: 20210225
Implantable Lead Location: 753858
Implantable Lead Location: 753859
Implantable Lead Location: 753860
Implantable Lead Model: 3830
Implantable Pulse Generator Implant Date: 20201105
Lead Channel Impedance Value: 290 Ohm
Lead Channel Impedance Value: 330 Ohm
Lead Channel Impedance Value: 360 Ohm
Lead Channel Pacing Threshold Amplitude: 0.5 V
Lead Channel Pacing Threshold Amplitude: 0.75 V
Lead Channel Pacing Threshold Amplitude: 0.75 V
Lead Channel Pacing Threshold Pulse Width: 0.05 ms
Lead Channel Pacing Threshold Pulse Width: 0.5 ms
Lead Channel Pacing Threshold Pulse Width: 0.5 ms
Lead Channel Sensing Intrinsic Amplitude: 1.5 mV
Lead Channel Sensing Intrinsic Amplitude: 3.5 mV
Lead Channel Setting Pacing Amplitude: 0.25 V
Lead Channel Setting Pacing Amplitude: 2 V
Lead Channel Setting Pacing Amplitude: 2 V
Lead Channel Setting Pacing Pulse Width: 0.05 ms
Lead Channel Setting Pacing Pulse Width: 0.5 ms
Lead Channel Setting Sensing Sensitivity: 0.5 mV
Pulse Gen Serial Number: 9849338

## 2021-09-23 DIAGNOSIS — E119 Type 2 diabetes mellitus without complications: Secondary | ICD-10-CM | POA: Diagnosis not present

## 2021-09-23 DIAGNOSIS — R1310 Dysphagia, unspecified: Secondary | ICD-10-CM | POA: Diagnosis not present

## 2021-09-23 DIAGNOSIS — I639 Cerebral infarction, unspecified: Secondary | ICD-10-CM | POA: Diagnosis not present

## 2021-10-16 DIAGNOSIS — I6381 Other cerebral infarction due to occlusion or stenosis of small artery: Secondary | ICD-10-CM | POA: Diagnosis not present

## 2021-10-16 DIAGNOSIS — E119 Type 2 diabetes mellitus without complications: Secondary | ICD-10-CM | POA: Diagnosis not present

## 2021-10-16 DIAGNOSIS — I509 Heart failure, unspecified: Secondary | ICD-10-CM | POA: Diagnosis not present

## 2021-10-16 DIAGNOSIS — I10 Essential (primary) hypertension: Secondary | ICD-10-CM | POA: Diagnosis not present

## 2021-10-17 NOTE — Progress Notes (Signed)
Remote ICD transmission.   

## 2021-10-24 DIAGNOSIS — E119 Type 2 diabetes mellitus without complications: Secondary | ICD-10-CM | POA: Diagnosis not present

## 2021-10-24 DIAGNOSIS — R1310 Dysphagia, unspecified: Secondary | ICD-10-CM | POA: Diagnosis not present

## 2021-10-24 DIAGNOSIS — I639 Cerebral infarction, unspecified: Secondary | ICD-10-CM | POA: Diagnosis not present

## 2021-11-23 DIAGNOSIS — R1310 Dysphagia, unspecified: Secondary | ICD-10-CM | POA: Diagnosis not present

## 2021-11-23 DIAGNOSIS — I639 Cerebral infarction, unspecified: Secondary | ICD-10-CM | POA: Diagnosis not present

## 2021-11-23 DIAGNOSIS — E119 Type 2 diabetes mellitus without complications: Secondary | ICD-10-CM | POA: Diagnosis not present

## 2021-11-29 ENCOUNTER — Telehealth: Payer: Self-pay

## 2021-11-29 NOTE — Telephone Encounter (Signed)
PA initiated via Covermymeds; KEY: [id number]. Awaiting determination.

## 2021-12-01 NOTE — Telephone Encounter (Signed)
Received approval from [place] effective from 12/01/2021 through 12/01/2022 Patient made aware of approval.

## 2021-12-20 DIAGNOSIS — R69 Illness, unspecified: Secondary | ICD-10-CM | POA: Diagnosis not present

## 2021-12-22 LAB — CUP PACEART REMOTE DEVICE CHECK
Battery Remaining Longevity: 44 mo
Battery Remaining Percentage: 51 %
Battery Voltage: 2.95 V
Brady Statistic AP VP Percent: 1 %
Brady Statistic AP VS Percent: 1 %
Brady Statistic AS VP Percent: 99 %
Brady Statistic AS VS Percent: 1 %
Brady Statistic RA Percent Paced: 1 %
Date Time Interrogation Session: 20231124020015
HighPow Impedance: 59 Ohm
HighPow Impedance: 59 Ohm
Implantable Lead Connection Status: 753985
Implantable Lead Connection Status: 753985
Implantable Lead Connection Status: 753985
Implantable Lead Implant Date: 20201105
Implantable Lead Implant Date: 20201105
Implantable Lead Implant Date: 20210225
Implantable Lead Location: 753858
Implantable Lead Location: 753859
Implantable Lead Location: 753860
Implantable Lead Model: 3830
Implantable Pulse Generator Implant Date: 20201105
Lead Channel Impedance Value: 280 Ohm
Lead Channel Impedance Value: 390 Ohm
Lead Channel Impedance Value: 430 Ohm
Lead Channel Pacing Threshold Amplitude: 0.5 V
Lead Channel Pacing Threshold Amplitude: 0.75 V
Lead Channel Pacing Threshold Amplitude: 0.75 V
Lead Channel Pacing Threshold Pulse Width: 0.05 ms
Lead Channel Pacing Threshold Pulse Width: 0.5 ms
Lead Channel Pacing Threshold Pulse Width: 0.5 ms
Lead Channel Sensing Intrinsic Amplitude: 2.3 mV
Lead Channel Sensing Intrinsic Amplitude: 6.5 mV
Lead Channel Setting Pacing Amplitude: 0.25 V
Lead Channel Setting Pacing Amplitude: 2 V
Lead Channel Setting Pacing Amplitude: 2 V
Lead Channel Setting Pacing Pulse Width: 0.05 ms
Lead Channel Setting Pacing Pulse Width: 0.5 ms
Lead Channel Setting Sensing Sensitivity: 0.5 mV
Pulse Gen Serial Number: 9849338

## 2021-12-25 ENCOUNTER — Ambulatory Visit (INDEPENDENT_AMBULATORY_CARE_PROVIDER_SITE_OTHER): Payer: Self-pay

## 2021-12-25 DIAGNOSIS — I447 Left bundle-branch block, unspecified: Secondary | ICD-10-CM

## 2022-02-06 NOTE — Progress Notes (Signed)
Remote ICD transmission.   

## 2022-03-23 ENCOUNTER — Ambulatory Visit: Payer: Self-pay

## 2022-03-23 DIAGNOSIS — I447 Left bundle-branch block, unspecified: Secondary | ICD-10-CM

## 2022-03-23 LAB — CUP PACEART REMOTE DEVICE CHECK
Battery Remaining Longevity: 41 mo
Battery Remaining Percentage: 48 %
Battery Voltage: 2.93 V
Brady Statistic AP VP Percent: 1 %
Brady Statistic AP VS Percent: 1 %
Brady Statistic AS VP Percent: 99 %
Brady Statistic AS VS Percent: 1 %
Brady Statistic RA Percent Paced: 1 %
Date Time Interrogation Session: 20240223022826
HighPow Impedance: 53 Ohm
HighPow Impedance: 53 Ohm
Implantable Lead Connection Status: 753985
Implantable Lead Connection Status: 753985
Implantable Lead Connection Status: 753985
Implantable Lead Implant Date: 20201105
Implantable Lead Implant Date: 20201105
Implantable Lead Implant Date: 20210225
Implantable Lead Location: 753858
Implantable Lead Location: 753859
Implantable Lead Location: 753860
Implantable Lead Model: 3830
Implantable Pulse Generator Implant Date: 20201105
Lead Channel Impedance Value: 290 Ohm
Lead Channel Impedance Value: 380 Ohm
Lead Channel Impedance Value: 380 Ohm
Lead Channel Pacing Threshold Amplitude: 0.5 V
Lead Channel Pacing Threshold Amplitude: 0.75 V
Lead Channel Pacing Threshold Amplitude: 0.75 V
Lead Channel Pacing Threshold Pulse Width: 0.05 ms
Lead Channel Pacing Threshold Pulse Width: 0.5 ms
Lead Channel Pacing Threshold Pulse Width: 0.5 ms
Lead Channel Sensing Intrinsic Amplitude: 0.7 mV
Lead Channel Sensing Intrinsic Amplitude: 3 mV
Lead Channel Setting Pacing Amplitude: 0.25 V
Lead Channel Setting Pacing Amplitude: 2 V
Lead Channel Setting Pacing Amplitude: 2 V
Lead Channel Setting Pacing Pulse Width: 0.05 ms
Lead Channel Setting Pacing Pulse Width: 0.5 ms
Lead Channel Setting Sensing Sensitivity: 0.5 mV
Pulse Gen Serial Number: 9849338

## 2022-04-16 NOTE — Progress Notes (Signed)
Remote ICD transmission.   

## 2022-06-22 ENCOUNTER — Ambulatory Visit (INDEPENDENT_AMBULATORY_CARE_PROVIDER_SITE_OTHER): Payer: Self-pay

## 2022-06-22 DIAGNOSIS — I447 Left bundle-branch block, unspecified: Secondary | ICD-10-CM

## 2022-06-22 LAB — CUP PACEART REMOTE DEVICE CHECK
Battery Remaining Longevity: 38 mo
Battery Remaining Percentage: 45 %
Battery Voltage: 2.93 V
Brady Statistic AP VP Percent: 1 %
Brady Statistic AP VS Percent: 1 %
Brady Statistic AS VP Percent: 99 %
Brady Statistic AS VS Percent: 1 %
Brady Statistic RA Percent Paced: 1 %
Date Time Interrogation Session: 20240524035541
HighPow Impedance: 54 Ohm
HighPow Impedance: 54 Ohm
Implantable Lead Connection Status: 753985
Implantable Lead Connection Status: 753985
Implantable Lead Connection Status: 753985
Implantable Lead Implant Date: 20201105
Implantable Lead Implant Date: 20201105
Implantable Lead Implant Date: 20210225
Implantable Lead Location: 753858
Implantable Lead Location: 753859
Implantable Lead Location: 753860
Implantable Lead Model: 3830
Implantable Pulse Generator Implant Date: 20201105
Lead Channel Impedance Value: 290 Ohm
Lead Channel Impedance Value: 360 Ohm
Lead Channel Impedance Value: 380 Ohm
Lead Channel Pacing Threshold Amplitude: 0.5 V
Lead Channel Pacing Threshold Amplitude: 0.75 V
Lead Channel Pacing Threshold Amplitude: 0.75 V
Lead Channel Pacing Threshold Pulse Width: 0.05 ms
Lead Channel Pacing Threshold Pulse Width: 0.5 ms
Lead Channel Pacing Threshold Pulse Width: 0.5 ms
Lead Channel Sensing Intrinsic Amplitude: 0.6 mV
Lead Channel Sensing Intrinsic Amplitude: 3 mV
Lead Channel Setting Pacing Amplitude: 0.25 V
Lead Channel Setting Pacing Amplitude: 2 V
Lead Channel Setting Pacing Amplitude: 2 V
Lead Channel Setting Pacing Pulse Width: 0.05 ms
Lead Channel Setting Pacing Pulse Width: 0.5 ms
Lead Channel Setting Sensing Sensitivity: 0.5 mV
Pulse Gen Serial Number: 9849338

## 2022-07-10 NOTE — Progress Notes (Signed)
Remote ICD transmission.   

## 2022-09-21 ENCOUNTER — Ambulatory Visit (INDEPENDENT_AMBULATORY_CARE_PROVIDER_SITE_OTHER): Payer: 59

## 2022-09-21 DIAGNOSIS — I447 Left bundle-branch block, unspecified: Secondary | ICD-10-CM

## 2022-09-21 LAB — CUP PACEART REMOTE DEVICE CHECK
Battery Remaining Longevity: 36 mo
Battery Remaining Percentage: 41 %
Battery Voltage: 2.93 V
Brady Statistic AP VP Percent: 1 %
Brady Statistic AP VS Percent: 1 %
Brady Statistic AS VP Percent: 99 %
Brady Statistic AS VS Percent: 1 %
Brady Statistic RA Percent Paced: 1 %
Date Time Interrogation Session: 20240823020033
HighPow Impedance: 56 Ohm
HighPow Impedance: 56 Ohm
Implantable Lead Connection Status: 753985
Implantable Lead Connection Status: 753985
Implantable Lead Connection Status: 753985
Implantable Lead Implant Date: 20201105
Implantable Lead Implant Date: 20201105
Implantable Lead Implant Date: 20210225
Implantable Lead Location: 753858
Implantable Lead Location: 753859
Implantable Lead Location: 753860
Implantable Lead Model: 3830
Implantable Pulse Generator Implant Date: 20201105
Lead Channel Impedance Value: 300 Ohm
Lead Channel Impedance Value: 360 Ohm
Lead Channel Impedance Value: 390 Ohm
Lead Channel Pacing Threshold Amplitude: 0.75 V
Lead Channel Pacing Threshold Amplitude: 0.75 V
Lead Channel Pacing Threshold Amplitude: 0.75 V
Lead Channel Pacing Threshold Pulse Width: 0.5 ms
Lead Channel Pacing Threshold Pulse Width: 0.5 ms
Lead Channel Pacing Threshold Pulse Width: 0.5 ms
Lead Channel Sensing Intrinsic Amplitude: 0.5 mV
Lead Channel Sensing Intrinsic Amplitude: 5.7 mV
Lead Channel Setting Pacing Amplitude: 0.25 V
Lead Channel Setting Pacing Amplitude: 2 V
Lead Channel Setting Pacing Amplitude: 2 V
Lead Channel Setting Pacing Pulse Width: 0.05 ms
Lead Channel Setting Pacing Pulse Width: 0.5 ms
Lead Channel Setting Sensing Sensitivity: 0.5 mV
Pulse Gen Serial Number: 9849338

## 2022-09-26 NOTE — Progress Notes (Signed)
Remote ICD transmission.   

## 2022-12-21 ENCOUNTER — Ambulatory Visit: Payer: 59

## 2022-12-21 DIAGNOSIS — I447 Left bundle-branch block, unspecified: Secondary | ICD-10-CM

## 2022-12-21 LAB — CUP PACEART REMOTE DEVICE CHECK
Battery Remaining Longevity: 34 mo
Battery Remaining Percentage: 38 %
Battery Voltage: 2.92 V
Brady Statistic AP VP Percent: 1 %
Brady Statistic AP VS Percent: 1 %
Brady Statistic AS VP Percent: 99 %
Brady Statistic AS VS Percent: 1 %
Brady Statistic RA Percent Paced: 1 %
Date Time Interrogation Session: 20241122020021
HighPow Impedance: 57 Ohm
HighPow Impedance: 57 Ohm
Implantable Lead Connection Status: 753985
Implantable Lead Connection Status: 753985
Implantable Lead Connection Status: 753985
Implantable Lead Implant Date: 20201105
Implantable Lead Implant Date: 20201105
Implantable Lead Implant Date: 20210225
Implantable Lead Location: 753858
Implantable Lead Location: 753859
Implantable Lead Location: 753860
Implantable Lead Model: 3830
Implantable Pulse Generator Implant Date: 20201105
Lead Channel Impedance Value: 290 Ohm
Lead Channel Impedance Value: 340 Ohm
Lead Channel Impedance Value: 380 Ohm
Lead Channel Pacing Threshold Amplitude: 0.75 V
Lead Channel Pacing Threshold Amplitude: 0.75 V
Lead Channel Pacing Threshold Amplitude: 0.75 V
Lead Channel Pacing Threshold Pulse Width: 0.5 ms
Lead Channel Pacing Threshold Pulse Width: 0.5 ms
Lead Channel Pacing Threshold Pulse Width: 0.5 ms
Lead Channel Sensing Intrinsic Amplitude: 1.2 mV
Lead Channel Sensing Intrinsic Amplitude: 6.5 mV
Lead Channel Setting Pacing Amplitude: 0.25 V
Lead Channel Setting Pacing Amplitude: 2 V
Lead Channel Setting Pacing Amplitude: 2 V
Lead Channel Setting Pacing Pulse Width: 0.05 ms
Lead Channel Setting Pacing Pulse Width: 0.5 ms
Lead Channel Setting Sensing Sensitivity: 0.5 mV
Pulse Gen Serial Number: 9849338

## 2023-01-10 NOTE — Progress Notes (Signed)
Remote ICD transmission.   

## 2023-03-22 ENCOUNTER — Ambulatory Visit (INDEPENDENT_AMBULATORY_CARE_PROVIDER_SITE_OTHER): Payer: 59

## 2023-03-22 ENCOUNTER — Encounter: Payer: Self-pay | Admitting: Internal Medicine

## 2023-03-22 DIAGNOSIS — I447 Left bundle-branch block, unspecified: Secondary | ICD-10-CM

## 2023-03-22 LAB — CUP PACEART REMOTE DEVICE CHECK
Battery Remaining Longevity: 31 mo
Battery Remaining Percentage: 36 %
Battery Voltage: 2.92 V
Brady Statistic AP VP Percent: 1 %
Brady Statistic AP VS Percent: 1 %
Brady Statistic AS VP Percent: 99 %
Brady Statistic AS VS Percent: 1 %
Brady Statistic RA Percent Paced: 1 %
Date Time Interrogation Session: 20250221020032
HighPow Impedance: 66 Ohm
HighPow Impedance: 66 Ohm
Implantable Lead Connection Status: 753985
Implantable Lead Connection Status: 753985
Implantable Lead Connection Status: 753985
Implantable Lead Implant Date: 20201105
Implantable Lead Implant Date: 20201105
Implantable Lead Implant Date: 20210225
Implantable Lead Location: 753858
Implantable Lead Location: 753859
Implantable Lead Location: 753860
Implantable Lead Model: 3830
Implantable Pulse Generator Implant Date: 20201105
Lead Channel Impedance Value: 290 Ohm
Lead Channel Impedance Value: 380 Ohm
Lead Channel Impedance Value: 390 Ohm
Lead Channel Pacing Threshold Amplitude: 0.75 V
Lead Channel Pacing Threshold Amplitude: 0.75 V
Lead Channel Pacing Threshold Amplitude: 0.75 V
Lead Channel Pacing Threshold Pulse Width: 0.5 ms
Lead Channel Pacing Threshold Pulse Width: 0.5 ms
Lead Channel Pacing Threshold Pulse Width: 0.5 ms
Lead Channel Sensing Intrinsic Amplitude: 0.6 mV
Lead Channel Sensing Intrinsic Amplitude: 9.3 mV
Lead Channel Setting Pacing Amplitude: 0.25 V
Lead Channel Setting Pacing Amplitude: 2 V
Lead Channel Setting Pacing Amplitude: 2 V
Lead Channel Setting Pacing Pulse Width: 0.05 ms
Lead Channel Setting Pacing Pulse Width: 0.5 ms
Lead Channel Setting Sensing Sensitivity: 0.5 mV
Pulse Gen Serial Number: 9849338

## 2023-04-23 NOTE — Progress Notes (Signed)
 Remote ICD transmission.

## 2023-06-21 ENCOUNTER — Ambulatory Visit (INDEPENDENT_AMBULATORY_CARE_PROVIDER_SITE_OTHER): Payer: 59

## 2023-06-21 DIAGNOSIS — I447 Left bundle-branch block, unspecified: Secondary | ICD-10-CM

## 2023-06-21 LAB — CUP PACEART REMOTE DEVICE CHECK
Battery Remaining Longevity: 29 mo
Battery Remaining Percentage: 33 %
Battery Voltage: 2.92 V
Brady Statistic AP VP Percent: 1 %
Brady Statistic AP VS Percent: 1 %
Brady Statistic AS VP Percent: 94 %
Brady Statistic AS VS Percent: 1 %
Brady Statistic RA Percent Paced: 1 %
Date Time Interrogation Session: 20250523060010
HighPow Impedance: 66 Ohm
HighPow Impedance: 66 Ohm
Implantable Lead Connection Status: 753985
Implantable Lead Connection Status: 753985
Implantable Lead Connection Status: 753985
Implantable Lead Implant Date: 20201105
Implantable Lead Implant Date: 20201105
Implantable Lead Implant Date: 20210225
Implantable Lead Location: 753858
Implantable Lead Location: 753859
Implantable Lead Location: 753860
Implantable Lead Model: 3830
Implantable Pulse Generator Implant Date: 20201105
Lead Channel Impedance Value: 290 Ohm
Lead Channel Impedance Value: 350 Ohm
Lead Channel Impedance Value: 400 Ohm
Lead Channel Pacing Threshold Amplitude: 0.5 V
Lead Channel Pacing Threshold Amplitude: 0.5 V
Lead Channel Pacing Threshold Amplitude: 0.75 V
Lead Channel Pacing Threshold Pulse Width: 0.5 ms
Lead Channel Pacing Threshold Pulse Width: 0.5 ms
Lead Channel Pacing Threshold Pulse Width: 0.5 ms
Lead Channel Sensing Intrinsic Amplitude: 0.5 mV
Lead Channel Sensing Intrinsic Amplitude: 7 mV
Lead Channel Setting Pacing Amplitude: 0.25 V
Lead Channel Setting Pacing Amplitude: 2 V
Lead Channel Setting Pacing Amplitude: 2 V
Lead Channel Setting Pacing Pulse Width: 0.05 ms
Lead Channel Setting Pacing Pulse Width: 0.5 ms
Lead Channel Setting Sensing Sensitivity: 0.5 mV
Pulse Gen Serial Number: 9849338

## 2023-06-23 ENCOUNTER — Ambulatory Visit: Payer: Self-pay | Admitting: Internal Medicine

## 2023-07-30 NOTE — Progress Notes (Signed)
 Remote ICD transmission.

## 2023-09-20 ENCOUNTER — Ambulatory Visit: Payer: 59

## 2023-09-20 DIAGNOSIS — I447 Left bundle-branch block, unspecified: Secondary | ICD-10-CM

## 2023-09-20 LAB — CUP PACEART REMOTE DEVICE CHECK
Battery Remaining Longevity: 25 mo
Battery Remaining Percentage: 29 %
Battery Voltage: 2.9 V
Brady Statistic AP VP Percent: 1 %
Brady Statistic AP VS Percent: 1 %
Brady Statistic AS VP Percent: 97 %
Brady Statistic AS VS Percent: 1 %
Brady Statistic RA Percent Paced: 1 %
Date Time Interrogation Session: 20250822103124
HighPow Impedance: 65 Ohm
HighPow Impedance: 65 Ohm
Implantable Lead Connection Status: 753985
Implantable Lead Connection Status: 753985
Implantable Lead Connection Status: 753985
Implantable Lead Implant Date: 20201105
Implantable Lead Implant Date: 20201105
Implantable Lead Implant Date: 20210225
Implantable Lead Location: 753858
Implantable Lead Location: 753859
Implantable Lead Location: 753860
Implantable Lead Model: 3830
Implantable Pulse Generator Implant Date: 20201105
Lead Channel Impedance Value: 300 Ohm
Lead Channel Impedance Value: 350 Ohm
Lead Channel Impedance Value: 400 Ohm
Lead Channel Pacing Threshold Amplitude: 0.5 V
Lead Channel Pacing Threshold Amplitude: 0.5 V
Lead Channel Pacing Threshold Amplitude: 0.75 V
Lead Channel Pacing Threshold Pulse Width: 0.5 ms
Lead Channel Pacing Threshold Pulse Width: 0.5 ms
Lead Channel Pacing Threshold Pulse Width: 0.5 ms
Lead Channel Sensing Intrinsic Amplitude: 2 mV
Lead Channel Sensing Intrinsic Amplitude: 4.5 mV
Lead Channel Setting Pacing Amplitude: 0.25 V
Lead Channel Setting Pacing Amplitude: 2 V
Lead Channel Setting Pacing Amplitude: 2 V
Lead Channel Setting Pacing Pulse Width: 0.05 ms
Lead Channel Setting Pacing Pulse Width: 0.5 ms
Lead Channel Setting Sensing Sensitivity: 0.5 mV
Pulse Gen Serial Number: 9849338

## 2023-09-22 ENCOUNTER — Ambulatory Visit: Payer: Self-pay | Admitting: Internal Medicine

## 2023-10-22 NOTE — Progress Notes (Signed)
Remote ICD Transmission.

## 2023-12-20 ENCOUNTER — Ambulatory Visit (INDEPENDENT_AMBULATORY_CARE_PROVIDER_SITE_OTHER): Payer: 59

## 2023-12-20 DIAGNOSIS — I447 Left bundle-branch block, unspecified: Secondary | ICD-10-CM

## 2023-12-20 LAB — CUP PACEART REMOTE DEVICE CHECK
Battery Remaining Longevity: 23 mo
Battery Remaining Percentage: 26 %
Battery Voltage: 2.9 V
Brady Statistic AP VP Percent: 1 %
Brady Statistic AP VS Percent: 1 %
Brady Statistic AS VP Percent: 96 %
Brady Statistic AS VS Percent: 1 %
Brady Statistic RA Percent Paced: 1 %
Date Time Interrogation Session: 20251121020026
HighPow Impedance: 65 Ohm
HighPow Impedance: 65 Ohm
Implantable Lead Connection Status: 753985
Implantable Lead Connection Status: 753985
Implantable Lead Connection Status: 753985
Implantable Lead Implant Date: 20201105
Implantable Lead Implant Date: 20201105
Implantable Lead Implant Date: 20210225
Implantable Lead Location: 753858
Implantable Lead Location: 753859
Implantable Lead Location: 753860
Implantable Lead Model: 3830
Implantable Pulse Generator Implant Date: 20201105
Lead Channel Impedance Value: 290 Ohm
Lead Channel Impedance Value: 350 Ohm
Lead Channel Impedance Value: 390 Ohm
Lead Channel Pacing Threshold Amplitude: 0.5 V
Lead Channel Pacing Threshold Amplitude: 0.75 V
Lead Channel Pacing Threshold Amplitude: 1 V
Lead Channel Pacing Threshold Pulse Width: 0.05 ms
Lead Channel Pacing Threshold Pulse Width: 0.5 ms
Lead Channel Pacing Threshold Pulse Width: 0.5 ms
Lead Channel Sensing Intrinsic Amplitude: 0.6 mV
Lead Channel Sensing Intrinsic Amplitude: 5.9 mV
Lead Channel Setting Pacing Amplitude: 0.25 V
Lead Channel Setting Pacing Amplitude: 2 V
Lead Channel Setting Pacing Amplitude: 2 V
Lead Channel Setting Pacing Pulse Width: 0.05 ms
Lead Channel Setting Pacing Pulse Width: 0.5 ms
Lead Channel Setting Sensing Sensitivity: 0.5 mV
Pulse Gen Serial Number: 9849338

## 2023-12-22 ENCOUNTER — Ambulatory Visit: Payer: Self-pay | Admitting: Internal Medicine

## 2023-12-23 NOTE — Progress Notes (Signed)
 Remote ICD Transmission

## 2024-03-20 ENCOUNTER — Ambulatory Visit: Payer: 59
